# Patient Record
Sex: Female | Born: 1955 | Race: White | Hispanic: No | Smoking: Former smoker
Health system: Southern US, Community
[De-identification: ages and names within clinical notes are randomized; demographics above are authoritative.]

## PROBLEM LIST (undated history)

## (undated) DIAGNOSIS — J439 Emphysema, unspecified: Secondary | ICD-10-CM

## (undated) DIAGNOSIS — M545 Low back pain, unspecified: Secondary | ICD-10-CM

## (undated) DIAGNOSIS — G4700 Insomnia, unspecified: Secondary | ICD-10-CM

## (undated) DIAGNOSIS — G8929 Other chronic pain: Secondary | ICD-10-CM

## (undated) DIAGNOSIS — G8194 Hemiplegia, unspecified affecting left nondominant side: Secondary | ICD-10-CM

## (undated) DIAGNOSIS — G90529 Complex regional pain syndrome I of unspecified lower limb: Secondary | ICD-10-CM

## (undated) DIAGNOSIS — K219 Gastro-esophageal reflux disease without esophagitis: Secondary | ICD-10-CM

## (undated) DIAGNOSIS — E042 Nontoxic multinodular goiter: Secondary | ICD-10-CM

## (undated) DIAGNOSIS — J189 Pneumonia, unspecified organism: Secondary | ICD-10-CM

## (undated) DIAGNOSIS — F32A Depression, unspecified: Secondary | ICD-10-CM

## (undated) DIAGNOSIS — Z8669 Personal history of other diseases of the nervous system and sense organs: Secondary | ICD-10-CM

## (undated) DIAGNOSIS — F329 Major depressive disorder, single episode, unspecified: Secondary | ICD-10-CM

## (undated) DIAGNOSIS — I639 Cerebral infarction, unspecified: Secondary | ICD-10-CM

## (undated) DIAGNOSIS — F419 Anxiety disorder, unspecified: Secondary | ICD-10-CM

## (undated) DIAGNOSIS — J069 Acute upper respiratory infection, unspecified: Secondary | ICD-10-CM

## (undated) DIAGNOSIS — Z66 Do not resuscitate: Secondary | ICD-10-CM

## (undated) DIAGNOSIS — M75101 Unspecified rotator cuff tear or rupture of right shoulder, not specified as traumatic: Secondary | ICD-10-CM

## (undated) DIAGNOSIS — J449 Chronic obstructive pulmonary disease, unspecified: Secondary | ICD-10-CM

## (undated) DIAGNOSIS — D649 Anemia, unspecified: Secondary | ICD-10-CM

## (undated) DIAGNOSIS — M1990 Unspecified osteoarthritis, unspecified site: Secondary | ICD-10-CM

## (undated) DIAGNOSIS — K760 Fatty (change of) liver, not elsewhere classified: Secondary | ICD-10-CM

## (undated) DIAGNOSIS — R195 Other fecal abnormalities: Secondary | ICD-10-CM

## (undated) DIAGNOSIS — R51 Headache: Secondary | ICD-10-CM

## (undated) DIAGNOSIS — I4891 Unspecified atrial fibrillation: Secondary | ICD-10-CM

## (undated) DIAGNOSIS — N343 Urethral syndrome, unspecified: Secondary | ICD-10-CM

## (undated) DIAGNOSIS — G4730 Sleep apnea, unspecified: Secondary | ICD-10-CM

## (undated) DIAGNOSIS — Z87442 Personal history of urinary calculi: Secondary | ICD-10-CM

## (undated) HISTORY — DX: Hemiplegia, unspecified affecting left nondominant side: G81.94

## (undated) HISTORY — PX: BIOPSY THYROID: PRO38

## (undated) HISTORY — PX: APPENDECTOMY: SHX54

## (undated) HISTORY — PX: OTHER SURGICAL HISTORY: SHX169

## (undated) HISTORY — DX: Insomnia, unspecified: G47.00

## (undated) HISTORY — DX: Other fecal abnormalities: R19.5

## (undated) HISTORY — DX: Personal history of other diseases of the nervous system and sense organs: Z86.69

## (undated) HISTORY — DX: Emphysema, unspecified: J43.9

## (undated) HISTORY — DX: Unspecified atrial fibrillation: I48.91

## (undated) HISTORY — DX: Fatty (change of) liver, not elsewhere classified: K76.0

## (undated) HISTORY — PX: FRACTURE SURGERY: SHX138

---

## 1993-08-30 HISTORY — PX: ORIF ANKLE FRACTURE: SUR919

## 1995-08-31 DIAGNOSIS — I639 Cerebral infarction, unspecified: Secondary | ICD-10-CM

## 1995-08-31 HISTORY — DX: Cerebral infarction, unspecified: I63.9

## 2001-08-30 HISTORY — PX: ABDOMINAL HYSTERECTOMY: SHX81

## 2004-08-30 HISTORY — PX: OTHER SURGICAL HISTORY: SHX169

## 2008-08-30 DIAGNOSIS — G4730 Sleep apnea, unspecified: Secondary | ICD-10-CM | POA: Diagnosis present

## 2008-08-30 HISTORY — DX: Sleep apnea, unspecified: G47.30

## 2011-08-26 ENCOUNTER — Other Ambulatory Visit (HOSPITAL_COMMUNITY): Payer: Self-pay | Admitting: Orthopedic Surgery

## 2011-09-06 ENCOUNTER — Encounter (HOSPITAL_COMMUNITY): Payer: Self-pay | Admitting: Pharmacy Technician

## 2011-09-08 ENCOUNTER — Other Ambulatory Visit (HOSPITAL_COMMUNITY): Payer: Medicare Other

## 2011-09-09 ENCOUNTER — Inpatient Hospital Stay (HOSPITAL_COMMUNITY): Admission: RE | Admit: 2011-09-09 | Discharge: 2011-09-09 | Payer: Medicare Other | Source: Ambulatory Visit

## 2011-09-09 MED ORDER — CEFAZOLIN SODIUM 1-5 GM-% IV SOLN: 1.0000 g | INTRAVENOUS | Status: DC

## 2011-09-09 NOTE — Progress Notes (Signed)
I phoned patients home and spoke with patient -she said  she is not having surgery in the am because she has the flu.  "My daughter called somebody and told them that I wasn't coming in" Pt is still on the OR schedule.  I phoned the office, but it was closed.

## 2011-09-10 ENCOUNTER — Encounter (HOSPITAL_COMMUNITY): Payer: Self-pay | Admitting: Anesthesiology

## 2011-09-10 ENCOUNTER — Encounter (HOSPITAL_COMMUNITY)
Admission: RE | Disposition: A | Discharge status: Home / Self Care | Payer: Self-pay | Source: Ambulatory Visit | Attending: Orthopedic Surgery

## 2011-09-10 ENCOUNTER — Ambulatory Visit (HOSPITAL_COMMUNITY)
Admission: RE | Admit: 2011-09-10 | Discharge: 2011-09-10 | Disposition: A | Discharge status: Home / Self Care | Payer: Medicare Other | Source: Ambulatory Visit | Attending: Orthopedic Surgery | Admitting: Orthopedic Surgery

## 2011-09-10 SURGERY — AMPUTATION, ABOVE KNEE
Anesthesia: General | Site: Leg Upper | Laterality: Left

## 2011-09-15 ENCOUNTER — Other Ambulatory Visit (HOSPITAL_COMMUNITY): Payer: Self-pay | Admitting: Orthopedic Surgery

## 2011-09-21 ENCOUNTER — Encounter (HOSPITAL_COMMUNITY): Payer: Self-pay | Admitting: Respiratory Therapy

## 2011-09-28 ENCOUNTER — Ambulatory Visit (HOSPITAL_COMMUNITY)
Admission: RE | Admit: 2011-09-28 | Discharge: 2011-09-28 | Disposition: A | Discharge status: Home / Self Care | Payer: Medicare Other | Source: Ambulatory Visit | Attending: Orthopedic Surgery | Admitting: Orthopedic Surgery

## 2011-09-28 ENCOUNTER — Encounter (HOSPITAL_COMMUNITY): Payer: Self-pay

## 2011-09-28 DIAGNOSIS — G8929 Other chronic pain: Secondary | ICD-10-CM | POA: Insufficient documentation

## 2011-09-28 DIAGNOSIS — D649 Anemia, unspecified: Secondary | ICD-10-CM | POA: Insufficient documentation

## 2011-09-28 DIAGNOSIS — K219 Gastro-esophageal reflux disease without esophagitis: Secondary | ICD-10-CM | POA: Insufficient documentation

## 2011-09-28 DIAGNOSIS — J4489 Other specified chronic obstructive pulmonary disease: Secondary | ICD-10-CM | POA: Insufficient documentation

## 2011-09-28 DIAGNOSIS — R51 Headache: Secondary | ICD-10-CM | POA: Insufficient documentation

## 2011-09-28 DIAGNOSIS — G589 Mononeuropathy, unspecified: Secondary | ICD-10-CM | POA: Insufficient documentation

## 2011-09-28 DIAGNOSIS — J449 Chronic obstructive pulmonary disease, unspecified: Secondary | ICD-10-CM | POA: Insufficient documentation

## 2011-09-28 DIAGNOSIS — Z8673 Personal history of transient ischemic attack (TIA), and cerebral infarction without residual deficits: Secondary | ICD-10-CM | POA: Insufficient documentation

## 2011-09-28 DIAGNOSIS — F411 Generalized anxiety disorder: Secondary | ICD-10-CM | POA: Insufficient documentation

## 2011-09-28 DIAGNOSIS — G4733 Obstructive sleep apnea (adult) (pediatric): Secondary | ICD-10-CM | POA: Insufficient documentation

## 2011-09-28 DIAGNOSIS — Z01812 Encounter for preprocedural laboratory examination: Secondary | ICD-10-CM | POA: Insufficient documentation

## 2011-09-28 DIAGNOSIS — M549 Dorsalgia, unspecified: Secondary | ICD-10-CM | POA: Insufficient documentation

## 2011-09-28 DIAGNOSIS — M21549 Acquired clubfoot, unspecified foot: Secondary | ICD-10-CM | POA: Insufficient documentation

## 2011-09-28 HISTORY — DX: Headache: R51

## 2011-09-28 HISTORY — DX: Chronic obstructive pulmonary disease, unspecified: J44.9

## 2011-09-28 HISTORY — DX: Complex regional pain syndrome i of unspecified lower limb: G90.529

## 2011-09-28 HISTORY — DX: Unspecified rotator cuff tear or rupture of right shoulder, not specified as traumatic: M75.101

## 2011-09-28 HISTORY — DX: Low back pain, unspecified: M54.50

## 2011-09-28 HISTORY — DX: Low back pain: M54.5

## 2011-09-28 HISTORY — DX: Anemia, unspecified: D64.9

## 2011-09-28 HISTORY — DX: Gastro-esophageal reflux disease without esophagitis: K21.9

## 2011-09-28 HISTORY — DX: Unspecified osteoarthritis, unspecified site: M19.90

## 2011-09-28 HISTORY — DX: Urethral syndrome, unspecified: N34.3

## 2011-09-28 HISTORY — DX: Sleep apnea, unspecified: G47.30

## 2011-09-28 HISTORY — DX: Anxiety disorder, unspecified: F41.9

## 2011-09-28 HISTORY — DX: Cerebral infarction, unspecified: I63.9

## 2011-09-28 HISTORY — DX: Other chronic pain: G89.29

## 2011-09-28 LAB — COMPREHENSIVE METABOLIC PANEL
ALT: 57 U/L — ABNORMAL HIGH (ref 0–35)
Albumin: 4.1 g/dL (ref 3.5–5.2)
Alkaline Phosphatase: 124 U/L — ABNORMAL HIGH (ref 39–117)
BUN: 6 mg/dL (ref 6–23)
Chloride: 92 mEq/L — ABNORMAL LOW (ref 96–112)
GFR calc Af Amer: >90 mL/min (ref >90)
Glucose, Bld: 135 mg/dL — ABNORMAL HIGH (ref 70–99)
Potassium: 2.8 mEq/L — ABNORMAL LOW (ref 3.5–5.1)
Sodium: 141 mEq/L (ref 135–145)
Total Bilirubin: 0.6 mg/dL (ref 0.3–1.2)

## 2011-09-28 LAB — CBC
HCT: 49.1 % — ABNORMAL HIGH (ref 36.0–46.0)
Hemoglobin: 16.7 g/dL — ABNORMAL HIGH (ref 12.0–15.0)
WBC: 12.5 10*3/uL — ABNORMAL HIGH (ref 4.0–10.5)

## 2011-09-28 LAB — SURGICAL PCR SCREEN
MRSA, PCR: POSITIVE — AB
Staphylococcus aureus: POSITIVE — AB

## 2011-09-28 LAB — PROTIME-INR: Prothrombin Time: 13.6 seconds (ref 11.6–15.2)

## 2011-09-28 LAB — APTT: aPTT: 42 seconds — ABNORMAL HIGH (ref 24–37)

## 2011-09-28 NOTE — Pre-Procedure Instructions (Signed)
[address] Amanda Lewis  09/28/2011   Your procedure is scheduled on:WED  **Wed, Feb 6*  Report to [facility] at 0900 AM.  Call this number if you have problems the morning of surgery: [phone]   Remember:   Do not eat food:After Midnight.  May have clear liquids: up to 4 Hours before arrival.  Clear liquids include soda, tea, black coffee, apple or grape juice, broth.  Take these medicines the morning of surgery with A SIP OF WATER: *Oxycodone,Theodur,Seroquel,Nexium, Cymbalta, Flexeril, inhaler if needed**   Do not wear jewelry, make-up or nail polish.  Do not wear lotions, powders, or perfumes. You may wear deodorant.  Do not shave 48 hours prior to surgery.  Do not bring valuables to the hospital.  Contacts, dentures or bridgework may not be worn into surgery.  Leave suitcase in the car. After surgery it may be brought to your room.  For patients admitted to the hospital, checkout time is 11:00 AM the day of discharge.   Patients discharged the day of surgery will not be allowed to drive home.  Name and phone number of your driver: *n/a**  Special Instructions: CHG Shower Use Special Wash: 1/2 bottle night before surgery and 1/2 bottle morning of surgery.   Please read over the following fact sheets that you were given: Pain Booklet, Coughing and Deep Breathing, MRSA Information and Surgical Site Infection Prevention

## 2011-09-29 ENCOUNTER — Encounter (HOSPITAL_COMMUNITY): Payer: Self-pay | Admitting: Vascular Surgery

## 2011-09-29 NOTE — Consult Note (Signed)
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  Her history includes CVA, COPD, former smoker, OSA, anemia, GERD, headaches, anxiety, chronic back pain.  CXR from 10/27/10 at [facility] showed cardiomegaly, bibasilar ATX.    She had an echo on 03/30/11 at [facility] [phone]) that showed normal diastolic function, moderate resting HTN of 140/90, normal LVEF 65%, nor  mal LV cavity size, thickness, normal valves.      Her PCP Dr. Stefanie Libel office says they do not have an EKG.  I've requested one from [facility] if available, otherwise she will need one on the day of surgery.  Labs noted.  I called her K+ result to Dr. Stefanie Libel and Dr. Audrie Lia office (spoke with Denny Peon).  The patient was also notified and says she will touch base with Dr.Haque today to see if he will prescribe KCL supplementation.  She is on Lasix.  She also has an appointment to see him tomorrow.  Lab results were faxed to Dr. Ardelle Park.  Her AST and ALT were also mildly elevated at 55 and 57, respectively.  PTT was 42.  PT/INR WNL. WBC 12.5.  If we have not received recent EKG, then she will need an EKG, BMET, and PTT on arrival on the day of surgery.

## 2011-10-06 ENCOUNTER — Inpatient Hospital Stay (HOSPITAL_COMMUNITY): Admission: RE | Admit: 2011-10-06 | Payer: Medicare Other | Source: Ambulatory Visit | Admitting: Orthopedic Surgery

## 2011-10-06 ENCOUNTER — Encounter (HOSPITAL_COMMUNITY): Admission: RE | Payer: Self-pay | Source: Ambulatory Visit

## 2011-10-06 SURGERY — AMPUTATION, ABOVE KNEE
Anesthesia: General | Site: Leg Lower | Laterality: Left

## 2012-01-03 ENCOUNTER — Other Ambulatory Visit (HOSPITAL_COMMUNITY): Payer: Self-pay | Admitting: Orthopedic Surgery

## 2012-01-03 ENCOUNTER — Encounter (HOSPITAL_COMMUNITY): Payer: Self-pay | Admitting: Pharmacy Technician

## 2012-01-11 ENCOUNTER — Encounter (HOSPITAL_COMMUNITY): Payer: Self-pay | Admitting: *Deleted

## 2012-01-11 MED ORDER — CEFAZOLIN SODIUM 1-5 GM-% IV SOLN
1.0000 g | INTRAVENOUS | Status: DC
  Filled 2012-01-11: qty 50

## 2012-01-11 NOTE — Progress Notes (Signed)
Sleep Study requested from Dr Stefanie Libel OFFICE, Loran Senters

## 2012-01-12 ENCOUNTER — Inpatient Hospital Stay (HOSPITAL_COMMUNITY): Payer: Medicare Other

## 2012-01-12 ENCOUNTER — Encounter (HOSPITAL_COMMUNITY)
Admission: RE | Disposition: A | Discharge status: Skilled Nursing Facility | Payer: Self-pay | Source: Ambulatory Visit | Attending: Orthopedic Surgery

## 2012-01-12 ENCOUNTER — Encounter (HOSPITAL_COMMUNITY): Payer: Self-pay | Admitting: Anesthesiology

## 2012-01-12 ENCOUNTER — Inpatient Hospital Stay (HOSPITAL_COMMUNITY): Payer: Medicare Other | Admitting: Anesthesiology

## 2012-01-12 ENCOUNTER — Encounter (HOSPITAL_COMMUNITY): Payer: Self-pay | Admitting: *Deleted

## 2012-01-12 ENCOUNTER — Inpatient Hospital Stay (HOSPITAL_COMMUNITY)
Admission: RE | Admit: 2012-01-12 | Discharge: 2012-01-18 | DRG: 041 | Disposition: A | Discharge status: Skilled Nursing Facility | Payer: Medicare Other | Source: Ambulatory Visit | Attending: Orthopedic Surgery | Admitting: Orthopedic Surgery

## 2012-01-12 DIAGNOSIS — N189 Chronic kidney disease, unspecified: Secondary | ICD-10-CM | POA: Diagnosis present

## 2012-01-12 DIAGNOSIS — Z87891 Personal history of nicotine dependence: Secondary | ICD-10-CM

## 2012-01-12 DIAGNOSIS — F411 Generalized anxiety disorder: Secondary | ICD-10-CM | POA: Diagnosis present

## 2012-01-12 DIAGNOSIS — M129 Arthropathy, unspecified: Secondary | ICD-10-CM | POA: Diagnosis present

## 2012-01-12 DIAGNOSIS — J449 Chronic obstructive pulmonary disease, unspecified: Secondary | ICD-10-CM | POA: Diagnosis present

## 2012-01-12 DIAGNOSIS — I96 Gangrene, not elsewhere classified: Secondary | ICD-10-CM | POA: Diagnosis present

## 2012-01-12 DIAGNOSIS — J4489 Other specified chronic obstructive pulmonary disease: Secondary | ICD-10-CM | POA: Diagnosis present

## 2012-01-12 DIAGNOSIS — K219 Gastro-esophageal reflux disease without esophagitis: Secondary | ICD-10-CM | POA: Diagnosis present

## 2012-01-12 DIAGNOSIS — M62838 Other muscle spasm: Secondary | ICD-10-CM | POA: Diagnosis present

## 2012-01-12 DIAGNOSIS — Z8673 Personal history of transient ischemic attack (TIA), and cerebral infarction without residual deficits: Secondary | ICD-10-CM

## 2012-01-12 DIAGNOSIS — G90522 Complex regional pain syndrome I of left lower limb: Secondary | ICD-10-CM | POA: Diagnosis present

## 2012-01-12 DIAGNOSIS — G589 Mononeuropathy, unspecified: Principal | ICD-10-CM | POA: Diagnosis present

## 2012-01-12 HISTORY — DX: Major depressive disorder, single episode, unspecified: F32.9

## 2012-01-12 HISTORY — DX: Pneumonia, unspecified organism: J18.9

## 2012-01-12 HISTORY — DX: Depression, unspecified: F32.A

## 2012-01-12 HISTORY — DX: Acute upper respiratory infection, unspecified: J06.9

## 2012-01-12 HISTORY — PX: AMPUTATION: SHX166

## 2012-01-12 LAB — CBC
HCT: 43.6 % (ref 36.0–46.0)
MCH: 29.4 pg (ref 26.0–34.0)
MCHC: 32.3 g/dL (ref 30.0–36.0)
MCV: 90.8 fL (ref 78.0–100.0)
Platelets: 243 10*3/uL (ref 150–400)
RDW: 12.8 % (ref 11.5–15.5)
WBC: 9.9 10*3/uL (ref 4.0–10.5)

## 2012-01-12 LAB — SURGICAL PCR SCREEN
MRSA, PCR: NEGATIVE
Staphylococcus aureus: NEGATIVE

## 2012-01-12 LAB — COMPREHENSIVE METABOLIC PANEL
ALT: 15 U/L (ref 0–35)
AST: 14 U/L (ref 0–37)
Albumin: 3.6 g/dL (ref 3.5–5.2)
BUN: 7 mg/dL (ref 6–23)
Calcium: 10.3 mg/dL (ref 8.4–10.5)
Chloride: 102 mEq/L (ref 96–112)
Creatinine, Ser: 0.64 mg/dL (ref 0.50–1.10)
Sodium: 141 mEq/L (ref 135–145)
Total Bilirubin: 0.2 mg/dL — ABNORMAL LOW (ref 0.3–1.2)

## 2012-01-12 LAB — PROTIME-INR
INR: 1.03 (ref 0.00–1.49)
Prothrombin Time: 13.7 seconds (ref 11.6–15.2)

## 2012-01-12 SURGERY — AMPUTATION, ABOVE KNEE
Anesthesia: General | Site: Leg Upper | Laterality: Left | Wound class: Clean

## 2012-01-12 MED ORDER — FENTANYL 25 MCG/HR TD PT72
25.0000 ug | MEDICATED_PATCH | TRANSDERMAL | Status: DC
  Administered 2012-01-14 – 2012-01-16 (×2): 25 ug via TRANSDERMAL
  Filled 2012-01-12 (×3): qty 1

## 2012-01-12 MED ORDER — ONDANSETRON HCL 4 MG/2ML IJ SOLN: 4.0000 mg | Freq: Once | INTRAMUSCULAR | Status: DC | PRN

## 2012-01-12 MED ORDER — CEFAZOLIN SODIUM-DEXTROSE 2-3 GM-% IV SOLR
INTRAVENOUS | Status: AC
  Administered 2012-01-12: 2 g via INTRAVENOUS
  Filled 2012-01-12: qty 50

## 2012-01-12 MED ORDER — OXYCODONE HCL 5 MG PO TABS: 30.0000 mg | ORAL_TABLET | ORAL | Status: DC

## 2012-01-12 MED ORDER — METHOCARBAMOL 100 MG/ML IJ SOLN
500.0000 mg | Freq: Four times a day (QID) | INTRAVENOUS | Status: DC | PRN
  Filled 2012-01-12: qty 5

## 2012-01-12 MED ORDER — MUPIROCIN 2 % EX OINT
TOPICAL_OINTMENT | CUTANEOUS | Status: AC
  Administered 2012-01-12: 1
  Filled 2012-01-12: qty 22

## 2012-01-12 MED ORDER — THEOPHYLLINE ER 300 MG PO TB12
300.0000 mg | ORAL_TABLET | Freq: Two times a day (BID) | ORAL | Status: DC
  Administered 2012-01-12 – 2012-01-17 (×10): 300 mg via ORAL
  Filled 2012-01-12 (×13): qty 1

## 2012-01-12 MED ORDER — DIAZEPAM 5 MG PO TABS
10.0000 mg | ORAL_TABLET | Freq: Three times a day (TID) | ORAL | Status: DC | PRN
  Administered 2012-01-12 – 2012-01-17 (×3): 10 mg via ORAL
  Filled 2012-01-12 (×2): qty 2
  Filled 2012-01-12 (×2): qty 1

## 2012-01-12 MED ORDER — PROPOFOL 10 MG/ML IV EMUL
INTRAVENOUS | Status: DC | PRN
  Administered 2012-01-12: 40 mg via INTRAVENOUS
  Administered 2012-01-12: 150 mg via INTRAVENOUS

## 2012-01-12 MED ORDER — WARFARIN - PHARMACIST DOSING INPATIENT: Freq: Every day | Status: DC

## 2012-01-12 MED ORDER — FUROSEMIDE 20 MG PO TABS
20.0000 mg | ORAL_TABLET | Freq: Every day | ORAL | Status: DC
  Administered 2012-01-12 – 2012-01-17 (×6): 20 mg via ORAL
  Filled 2012-01-12 (×7): qty 1

## 2012-01-12 MED ORDER — METOCLOPRAMIDE HCL 5 MG/ML IJ SOLN: 5.0000 mg | Freq: Three times a day (TID) | INTRAMUSCULAR | Status: DC | PRN

## 2012-01-12 MED ORDER — DULOXETINE HCL 60 MG PO CPEP
60.0000 mg | ORAL_CAPSULE | Freq: Every day | ORAL | Status: DC
  Administered 2012-01-13 – 2012-01-17 (×5): 60 mg via ORAL
  Filled 2012-01-12 (×7): qty 1

## 2012-01-12 MED ORDER — ALBUTEROL SULFATE HFA 108 (90 BASE) MCG/ACT IN AERS
2.0000 | INHALATION_SPRAY | Freq: Four times a day (QID) | RESPIRATORY_TRACT | Status: DC | PRN
  Filled 2012-01-12: qty 6.7

## 2012-01-12 MED ORDER — DEXTROSE 5 % IV SOLN
INTRAVENOUS | Status: DC | PRN
  Administered 2012-01-12: 09:00:00 via INTRAVENOUS

## 2012-01-12 MED ORDER — PANTOPRAZOLE SODIUM 40 MG PO TBEC
40.0000 mg | DELAYED_RELEASE_TABLET | Freq: Every day | ORAL | Status: DC
  Administered 2012-01-12 – 2012-01-17 (×6): 40 mg via ORAL
  Filled 2012-01-12 (×4): qty 1

## 2012-01-12 MED ORDER — ONDANSETRON HCL 4 MG/2ML IJ SOLN: 4.0000 mg | Freq: Four times a day (QID) | INTRAMUSCULAR | Status: DC | PRN

## 2012-01-12 MED ORDER — CEFAZOLIN SODIUM-DEXTROSE 2-3 GM-% IV SOLR: 2.0000 g | INTRAVENOUS | Status: DC

## 2012-01-12 MED ORDER — HYDROCHLOROTHIAZIDE 25 MG PO TABS
25.0000 mg | ORAL_TABLET | Freq: Every day | ORAL | Status: DC
  Administered 2012-01-12 – 2012-01-16 (×4): 25 mg via ORAL
  Filled 2012-01-12 (×7): qty 1

## 2012-01-12 MED ORDER — FENTANYL CITRATE 0.05 MG/ML IJ SOLN
INTRAMUSCULAR | Status: DC | PRN
  Administered 2012-01-12 (×2): 100 ug via INTRAVENOUS

## 2012-01-12 MED ORDER — HYDROMORPHONE HCL PF 1 MG/ML IJ SOLN
0.5000 mg | INTRAMUSCULAR | Status: DC | PRN
  Administered 2012-01-12: 0.5 mg via INTRAVENOUS
  Administered 2012-01-12: 1 mg via INTRAVENOUS
  Administered 2012-01-12: 0.5 mg via INTRAVENOUS
  Administered 2012-01-13 – 2012-01-14 (×6): 1 mg via INTRAVENOUS
  Filled 2012-01-12 (×9): qty 1

## 2012-01-12 MED ORDER — OXYCODONE HCL 5 MG PO TABS
30.0000 mg | ORAL_TABLET | ORAL | Status: DC | PRN
  Administered 2012-01-12 (×2): 30 mg via ORAL
  Administered 2012-01-13: 15 mg via ORAL
  Administered 2012-01-13 (×2): 30 mg via ORAL
  Administered 2012-01-14: 15 mg via ORAL
  Administered 2012-01-14 – 2012-01-17 (×9): 30 mg via ORAL
  Filled 2012-01-12 (×13): qty 6
  Filled 2012-01-12: qty 15
  Filled 2012-01-12: qty 3
  Filled 2012-01-12: qty 6

## 2012-01-12 MED ORDER — COUMADIN BOOK
Freq: Once | Status: AC
  Administered 2012-01-12: 13:00:00
  Filled 2012-01-12: qty 1

## 2012-01-12 MED ORDER — HYDROMORPHONE HCL PF 1 MG/ML IJ SOLN
INTRAMUSCULAR | Status: AC
  Filled 2012-01-12: qty 1

## 2012-01-12 MED ORDER — SUCCINYLCHOLINE CHLORIDE 20 MG/ML IJ SOLN
INTRAMUSCULAR | Status: DC | PRN
  Administered 2012-01-12: 100 mg via INTRAVENOUS

## 2012-01-12 MED ORDER — SODIUM CHLORIDE 0.9 % IV SOLN: INTRAVENOUS | Status: DC

## 2012-01-12 MED ORDER — MIDAZOLAM HCL 5 MG/5ML IJ SOLN
INTRAMUSCULAR | Status: DC | PRN
  Administered 2012-01-12: 1 mg via INTRAVENOUS

## 2012-01-12 MED ORDER — ONDANSETRON HCL 4 MG/2ML IJ SOLN
INTRAMUSCULAR | Status: DC | PRN
  Administered 2012-01-12: 4 mg via INTRAVENOUS

## 2012-01-12 MED ORDER — HYDROMORPHONE HCL PF 1 MG/ML IJ SOLN
0.2500 mg | INTRAMUSCULAR | Status: DC | PRN
  Administered 2012-01-12 (×2): 0.5 mg via INTRAVENOUS
  Administered 2012-01-12: 1 mg via INTRAVENOUS

## 2012-01-12 MED ORDER — THEOPHYLLINE ER 300 MG PO TB12: 300.0000 mg | ORAL_TABLET | Freq: Two times a day (BID) | ORAL | Status: DC

## 2012-01-12 MED ORDER — CEFAZOLIN SODIUM-DEXTROSE 2-3 GM-% IV SOLR
2.0000 g | Freq: Four times a day (QID) | INTRAVENOUS | Status: AC
  Administered 2012-01-12 – 2012-01-13 (×3): 2 g via INTRAVENOUS
  Filled 2012-01-12 (×3): qty 50

## 2012-01-12 MED ORDER — 0.9 % SODIUM CHLORIDE (POUR BTL) OPTIME
TOPICAL | Status: DC | PRN
  Administered 2012-01-12: 1000 mL

## 2012-01-12 MED ORDER — METHOCARBAMOL 500 MG PO TABS
500.0000 mg | ORAL_TABLET | Freq: Four times a day (QID) | ORAL | Status: DC | PRN
Start: 2012-01-12 — End: 2012-01-18
  Administered 2012-01-12 – 2012-01-18 (×7): 500 mg via ORAL
  Filled 2012-01-12 (×8): qty 1

## 2012-01-12 MED ORDER — WARFARIN SODIUM 6 MG PO TABS
6.0000 mg | ORAL_TABLET | Freq: Once | ORAL | Status: AC
  Administered 2012-01-12: 6 mg via ORAL
  Filled 2012-01-12: qty 1

## 2012-01-12 MED ORDER — CYCLOBENZAPRINE HCL 10 MG PO TABS
10.0000 mg | ORAL_TABLET | ORAL | Status: DC
  Filled 2012-01-12: qty 1

## 2012-01-12 MED ORDER — GABAPENTIN 300 MG PO CAPS
300.0000 mg | ORAL_CAPSULE | Freq: Three times a day (TID) | ORAL | Status: DC
  Administered 2012-01-12 – 2012-01-17 (×18): 300 mg via ORAL
  Filled 2012-01-12 (×21): qty 1

## 2012-01-12 MED ORDER — QUETIAPINE FUMARATE 25 MG PO TABS
25.0000 mg | ORAL_TABLET | Freq: Two times a day (BID) | ORAL | Status: DC
  Administered 2012-01-12 – 2012-01-17 (×10): 25 mg via ORAL
  Filled 2012-01-12 (×15): qty 1

## 2012-01-12 MED ORDER — WARFARIN VIDEO: Freq: Once | Status: DC

## 2012-01-12 MED ORDER — OXYCODONE HCL 5 MG PO TABS
ORAL_TABLET | ORAL | Status: AC
  Filled 2012-01-12: qty 1

## 2012-01-12 MED ORDER — CYCLOBENZAPRINE HCL 10 MG PO TABS
10.0000 mg | ORAL_TABLET | Freq: Three times a day (TID) | ORAL | Status: DC
  Administered 2012-01-12 – 2012-01-17 (×17): 10 mg via ORAL
  Filled 2012-01-12 (×20): qty 1

## 2012-01-12 MED ORDER — FENTANYL 50 MCG/HR TD PT72
100.0000 ug | MEDICATED_PATCH | TRANSDERMAL | Status: DC
  Administered 2012-01-13 – 2012-01-17 (×3): 100 ug via TRANSDERMAL
  Filled 2012-01-12 (×3): qty 2

## 2012-01-12 MED ORDER — DIAZEPAM 5 MG PO TABS: 10.0000 mg | ORAL_TABLET | Freq: Three times a day (TID) | ORAL | Status: DC | PRN

## 2012-01-12 MED ORDER — METOCLOPRAMIDE HCL 10 MG PO TABS: 5.0000 mg | ORAL_TABLET | Freq: Three times a day (TID) | ORAL | Status: DC | PRN

## 2012-01-12 MED ORDER — ONDANSETRON HCL 4 MG PO TABS
4.0000 mg | ORAL_TABLET | Freq: Four times a day (QID) | ORAL | Status: DC | PRN
  Administered 2012-01-15: 4 mg via ORAL
  Filled 2012-01-12: qty 1

## 2012-01-12 MED ORDER — LACTATED RINGERS IV SOLN
INTRAVENOUS | Status: DC | PRN
  Administered 2012-01-12: 09:00:00 via INTRAVENOUS

## 2012-01-12 SURGICAL SUPPLY — 58 items
BANDAGE ESMARK 6X9 LF (GAUZE/BANDAGES/DRESSINGS) ×1 IMPLANT
BANDAGE GAUZE ELAST BULKY 4 IN (GAUZE/BANDAGES/DRESSINGS) ×1 IMPLANT
BLADE SAW RECIP 87.9 MT (BLADE) ×2 IMPLANT
BNDG CMPR 9X6 STRL LF SNTH (GAUZE/BANDAGES/DRESSINGS) ×1
BNDG COHESIVE 6X5 TAN STRL LF (GAUZE/BANDAGES/DRESSINGS) ×3 IMPLANT
BNDG ESMARK 6X9 LF (GAUZE/BANDAGES/DRESSINGS) ×2
BNDG GAUZE STRTCH 6 (GAUZE/BANDAGES/DRESSINGS) IMPLANT
CLOTH BEACON ORANGE TIMEOUT ST (SAFETY) ×2 IMPLANT
COVER SURGICAL LIGHT HANDLE (MISCELLANEOUS) ×2 IMPLANT
CUFF TOURNIQUET SINGLE 34IN LL (TOURNIQUET CUFF) IMPLANT
CUFF TOURNIQUET SINGLE 44IN (TOURNIQUET CUFF) IMPLANT
DRAIN PENROSE 1/2X12 LTX STRL (WOUND CARE) IMPLANT
DRAPE EXTREMITY T 121X128X90 (DRAPE) ×2 IMPLANT
DRAPE PROXIMA HALF (DRAPES) ×4 IMPLANT
DRAPE U-SHAPE 47X51 STRL (DRAPES) ×4 IMPLANT
DRSG ADAPTIC 3X8 NADH LF (GAUZE/BANDAGES/DRESSINGS) ×2 IMPLANT
DRSG PAD ABDOMINAL 8X10 ST (GAUZE/BANDAGES/DRESSINGS) ×1 IMPLANT
DURAPREP 26ML APPLICATOR (WOUND CARE) ×2 IMPLANT
ELECT CAUTERY BLADE 6.4 (BLADE) IMPLANT
ELECT REM PT RETURN 9FT ADLT (ELECTROSURGICAL) ×2
ELECTRODE REM PT RTRN 9FT ADLT (ELECTROSURGICAL) ×1 IMPLANT
EVACUATOR 1/8 PVC DRAIN (DRAIN) IMPLANT
GLOVE BIOGEL PI IND STRL 6.5 (GLOVE) IMPLANT
GLOVE BIOGEL PI IND STRL 7.0 (GLOVE) IMPLANT
GLOVE BIOGEL PI IND STRL 9 (GLOVE) ×1 IMPLANT
GLOVE BIOGEL PI INDICATOR 6.5 (GLOVE) ×1
GLOVE BIOGEL PI INDICATOR 7.0 (GLOVE) ×1
GLOVE BIOGEL PI INDICATOR 9 (GLOVE) ×1
GLOVE SS BIOGEL STRL SZ 6.5 (GLOVE) IMPLANT
GLOVE SUPERSENSE BIOGEL SZ 6.5 (GLOVE) ×1
GLOVE SURG ORTHO 9.0 STRL STRW (GLOVE) ×2 IMPLANT
GLOVE SURG SS PI 6.5 STRL IVOR (GLOVE) ×1 IMPLANT
GOWN PREVENTION PLUS XLARGE (GOWN DISPOSABLE) ×2 IMPLANT
GOWN SRG XL XLNG 56XLVL 4 (GOWN DISPOSABLE) ×1 IMPLANT
GOWN STRL NON-REIN LRG LVL3 (GOWN DISPOSABLE) ×1 IMPLANT
GOWN STRL NON-REIN XL XLG LVL4 (GOWN DISPOSABLE) ×2
KIT BASIN OR (CUSTOM PROCEDURE TRAY) ×2 IMPLANT
KIT ROOM TURNOVER OR (KITS) ×2 IMPLANT
MANIFOLD NEPTUNE II (INSTRUMENTS) ×2 IMPLANT
NS IRRIG 1000ML POUR BTL (IV SOLUTION) ×2 IMPLANT
PACK GENERAL/GYN (CUSTOM PROCEDURE TRAY) ×2 IMPLANT
PAD ARMBOARD 7.5X6 YLW CONV (MISCELLANEOUS) ×3 IMPLANT
PAD CAST 4YDX4 CTTN HI CHSV (CAST SUPPLIES) ×1 IMPLANT
PADDING CAST COTTON 4X4 STRL (CAST SUPPLIES) ×2
PADDING CAST COTTON 6X4 STRL (CAST SUPPLIES) ×2 IMPLANT
SPONGE GAUZE 4X4 12PLY (GAUZE/BANDAGES/DRESSINGS) ×2 IMPLANT
SPONGE LAP 18X18 X RAY DECT (DISPOSABLE) IMPLANT
STAPLER VISISTAT 35W (STAPLE) IMPLANT
STOCKINETTE IMPERVIOUS LG (DRAPES) ×1 IMPLANT
SUT PDS AB 1 CT  36 (SUTURE) ×2
SUT PDS AB 1 CT 36 (SUTURE) IMPLANT
SUT SILK 2 0 (SUTURE) ×2
SUT SILK 2-0 18XBRD TIE 12 (SUTURE) ×1 IMPLANT
SWAB COLLECTION DEVICE MRSA (MISCELLANEOUS) IMPLANT
TOWEL OR 17X24 6PK STRL BLUE (TOWEL DISPOSABLE) ×2 IMPLANT
TOWEL OR 17X26 10 PK STRL BLUE (TOWEL DISPOSABLE) ×2 IMPLANT
TUBE ANAEROBIC SPECIMEN COL (MISCELLANEOUS) IMPLANT
WATER STERILE IRR 1000ML POUR (IV SOLUTION) ×1 IMPLANT

## 2012-01-12 NOTE — H&P (Deleted)
Amanda Lewis is an 56 y.o. female.   Chief Complaint: Gangrene left leg HPI: Patient is a 56 year old woman who has had progressive gangrenous changes to the left lower extremity and presents at this time for a left above-the-knee amputation. Conservative wound care treatment has failed and she presents at this time for surgical intervention.  Past Medical History  Diagnosis Date  . Stroke 1997  . Asthma     exertional  . COPD (chronic obstructive pulmonary disease)     usesO2 24/7  . Anemia   . Dysuria-frequency syndrome   . GERD (gastroesophageal reflux disease)   . Arthritis     arms  . Rotator cuff tear, right   . Anxiety     on treatment  . Chronic lower back pain   . RSD lower limb     left lower leg  . Recurrent upper respiratory infection (URI)   . Pneumonia     in march  . Sleep apnea 2010    uses CPAP nightly- Dr Ardelle Park in [place]  . Chronic kidney disease     "Floating stone currentl:- 01/12/12  . Headache     intermittent migraines, Last one 12/29/2011  . Depression     Past Surgical History  Procedure Date  . Orif ankle fracture 1995  . Appendectomy   . Abdominal hysterectomy 2003  . Fracture surgery   . Morphine pump   . Morphine pump removed 2006    Family History  Problem Relation Age of Onset  . Anesthesia problems Neg Hx    Social History:  reports that she quit smoking about 3 years ago. Her smoking use included Cigarettes. She has a 70 pack-year smoking history. She has never used smokeless tobacco. She reports that she does not drink alcohol or use illicit drugs.  Allergies:  Allergies  Allergen Reactions  . Aspirin Hives  . Bactrim (Sulfamethoxazole-Tmp Ds) Hives  . Nitroglycerin Other (See Comments)    Blood pressure drops dangerously low  . Percodan (Oxycodone-Aspirin) Hives  . Tape Rash    Adhesive    No prescriptions prior to admission    No results found for this or any previous visit (from the past 48 hour(s)). No results  found.  Review of Systems  All other systems reviewed and are negative.    Height 5' 5.5" (1.664 m), weight 81.647 kg (180 lb). Physical Exam  Gangrenous changes left lower extremity. Assessment/Plan Gangrene left lower extremity.  Plan: We will plan for a left above-the-knee amputation. Risks and benefits of surgery were discussed including infection neurovascular injury nonhealing of the wound need for additional surgery patient and family state they understand and wish to proceed at this time.  Amanda Lewis V 01/12/2012, 6:09 AM

## 2012-01-12 NOTE — Progress Notes (Signed)
ANTICOAGULATION CONSULT NOTE - Initial Consult  Pharmacy Consult for Coumadin Indication: VTE prophylaxis  Allergies  Allergen Reactions  . Aspirin Hives  . Bactrim (Sulfamethoxazole-Tmp Ds) Hives  . Nitroglycerin Other (See Comments)    Blood pressure drops dangerously low  . Percodan (Oxycodone-Aspirin) Hives  . Tape Rash    Adhesive    Patient Measurements: Height: 5' 5.5" (166.4 cm) Weight: 180 lb (81.647 kg) IBW/kg (Calculated) : 58.15  Heparin Dosing Weight:   Vital Signs: Temp: 97.8 F (36.6 C) (05/15 1137) Temp src: Oral (05/15 0724) BP: 127/67 mmHg (05/15 1137) Pulse Rate: 99  (05/15 1137)  Labs:  [facility] 01/12/12 0757  HGB 14.1  HCT 43.6  PLT 243  APTT 35  LABPROT 13.7  INR 1.03  HEPARINUNFRC --  CREATININE 0.64  CKTOTAL --  CKMB --  TROPONINI --    Estimated Creatinine Clearance: 82.8 ml/min (by C-G formula based on Cr of 0.64).   Medical History: Past Medical History  Diagnosis Date  . Stroke 1997  . Asthma     exertional  . COPD (chronic obstructive pulmonary disease)     usesO2 24/7  . Anemia   . Dysuria-frequency syndrome   . GERD (gastroesophageal reflux disease)   . Arthritis     arms  . Rotator cuff tear, right   . Anxiety     on treatment  . Chronic lower back pain   . RSD lower limb     left lower leg  . Recurrent upper respiratory infection (URI)   . Pneumonia     in march  . Sleep apnea 2010    uses CPAP nightly- Dr Ardelle Park in [place]  . Chronic kidney disease     "Floating stone currentl:- 01/12/12  . Headache     intermittent migraines, Last one 12/29/2011  . Depression     Medications:  Prescriptions prior to admission  Medication Sig Dispense Refill  . albuterol (PROVENTIL HFA;VENTOLIN HFA) 108 (90 BASE) MCG/ACT inhaler Inhale 2 puffs into the lungs every 6 (six) hours as needed. For shortness of breath       . Cholecalciferol (VITAMIN D3) 5000 UNITS TABS Take 5,000 Units by mouth every other day.       .  cyclobenzaprine (FLEXERIL) 10 MG tablet Take 10 mg by mouth 3 (three) times daily. scheduled       . diazepam (VALIUM) 10 MG tablet Take 10 mg by mouth every 8 (eight) hours as needed. For anxiety      . DULoxetine (CYMBALTA) 60 MG capsule Take 60 mg by mouth daily.        Marland Kitchen esomeprazole (NEXIUM) 40 MG capsule Take 40 mg by mouth daily.        . fentaNYL (DURAGESIC - DOSED MCG/HR) 100 MCG/HR Place 1 patch onto the skin every other day as needed. For pain alternating with patch      . fentaNYL (DURAGESIC - DOSED MCG/HR) 25 MCG/HR Place 1 patch onto the skin every other day as needed. For pain. Alternating with patch      . furosemide (LASIX) 20 MG tablet Take 20 mg by mouth daily.        . hydrochlorothiazide (HYDRODIURIL) 25 MG tablet Take 25 mg by mouth daily.        Marland Kitchen oxycodone (ROXICODONE) 30 MG immediate release tablet Take 30 mg by mouth every 4 (four) hours as needed. For pain       . QUEtiapine (SEROQUEL) 25 MG tablet  Take 25 mg by mouth 2 (two) times daily.       . theophylline (THEODUR) 300 MG 12 hr tablet Take 300 mg by mouth 2 (two) times daily.          Assessment: 56yof to start Coumadin for VTE prophylaxis s/p AKA.  - Baseline INR: 1.03 - H/H and Plts wnl - Warfarin points: 4  Goal of Therapy:  INR 2-3   Plan:  1. Coumadin 6mg  po x 1 today 2. Daily PT/INR 3. Coumadin educational book/video  Cleon Dew [phone] 01/12/2012,12:55 PM

## 2012-01-12 NOTE — Progress Notes (Signed)
UR COMPLETED  

## 2012-01-12 NOTE — H&P (Signed)
Amanda Lewis is an 56 y.o. female.   Chief Complaint: Complex regional pain syndrome left lower extremity with 8.0 varus contracture unable to weight-bear on the left lower extremity HPI: Patient has had multiple surgeries on her left lower extremity. She has equinovarus contracture is unable to weight-bear has chronic pain which is uncontrollable despite pain management and patient was proceed with above-knee amputation do to the complex regional pain syndrome left lower extremity.  Past Medical History  Diagnosis Date  . Stroke 1997  . Asthma     exertional  . COPD (chronic obstructive pulmonary disease)     usesO2 24/7  . Anemia   . Dysuria-frequency syndrome   . GERD (gastroesophageal reflux disease)   . Arthritis     arms  . Rotator cuff tear, right   . Anxiety     on treatment  . Chronic lower back pain   . RSD lower limb     left lower leg  . Recurrent upper respiratory infection (URI)   . Pneumonia     in march  . Sleep apnea 2010    uses CPAP nightly- Dr Ardelle Park in [place]  . Chronic kidney disease     "Floating stone currentl:- 01/12/12  . Headache     intermittent migraines, Last one 12/29/2011  . Depression     Past Surgical History  Procedure Date  . Orif ankle fracture 1995  . Appendectomy   . Abdominal hysterectomy 2003  . Fracture surgery   . Morphine pump   . Morphine pump removed 2006    Family History  Problem Relation Age of Onset  . Anesthesia problems Neg Hx    Social History:  reports that she quit smoking about 3 years ago. Her smoking use included Cigarettes. She has a 70 pack-year smoking history. She has never used smokeless tobacco. She reports that she does not drink alcohol or use illicit drugs.  Allergies:  Allergies  Allergen Reactions  . Aspirin Hives  . Bactrim (Sulfamethoxazole-Tmp Ds) Hives  . Nitroglycerin Other (See Comments)    Blood pressure drops dangerously low  . Percodan (Oxycodone-Aspirin) Hives  . Tape Rash    Adhesive      Medications Prior to Admission  Medication Sig Dispense Refill  . albuterol (PROVENTIL HFA;VENTOLIN HFA) 108 (90 BASE) MCG/ACT inhaler Inhale 2 puffs into the lungs every 6 (six) hours as needed. For shortness of breath       . Cholecalciferol (VITAMIN D3) 5000 UNITS TABS Take 5,000 Units by mouth every other day.       . cyclobenzaprine (FLEXERIL) 10 MG tablet Take 10 mg by mouth 3 (three) times daily. scheduled       . diazepam (VALIUM) 10 MG tablet Take 10 mg by mouth every 8 (eight) hours as needed. For anxiety      . DULoxetine (CYMBALTA) 60 MG capsule Take 60 mg by mouth daily.        Marland Kitchen esomeprazole (NEXIUM) 40 MG capsule Take 40 mg by mouth daily.        . fentaNYL (DURAGESIC - DOSED MCG/HR) 100 MCG/HR Place 1 patch onto the skin every other day as needed. For pain alternating with patch      . fentaNYL (DURAGESIC - DOSED MCG/HR) 25 MCG/HR Place 1 patch onto the skin every other day as needed. For pain. Alternating with patch      . furosemide (LASIX) 20 MG tablet Take 20 mg by mouth daily.        Marland Kitchen  hydrochlorothiazide (HYDRODIURIL) 25 MG tablet Take 25 mg by mouth daily.        Marland Kitchen oxycodone (ROXICODONE) 30 MG immediate release tablet Take 30 mg by mouth every 4 (four) hours as needed. For pain       . QUEtiapine (SEROQUEL) 25 MG tablet Take 25 mg by mouth 2 (two) times daily.       . theophylline (THEODUR) 300 MG 12 hr tablet Take 300 mg by mouth 2 (two) times daily.          Results for orders placed during the hospital encounter of 01/12/12 (from the past 48 hour(s))  CBC     Status: Normal   Collection Time   01/12/12  7:57 AM      Component Value Range Comment   WBC 9.9  4.0 - 10.5 (K/uL)    RBC 4.80  3.87 - 5.11 (MIL/uL)    Hemoglobin 14.1  12.0 - 15.0 (g/dL)    HCT 16.1  09.6 - 04.5 (%)    MCV 90.8  78.0 - 100.0 (fL)    MCH 29.4  26.0 - 34.0 (pg)    MCHC 32.3  30.0 - 36.0 (g/dL)    RDW 40.9  81.1 - 91.4 (%)    Platelets 243  150 - 400 (K/uL)    No  results found.  Review of Systems  All other systems reviewed and are negative.    Blood pressure 106/70, pulse 96, temperature 97.9 F (36.6 C), temperature source Oral, resp. rate 18, height 5' 5.5" (1.664 m), weight 81.647 kg (180 lb), SpO2 94.00%. Physical Exam  On examination patient has equinovarus contracture she has thin atrophic skin hypersensitivity to light touch changes in temperature and skin moisture. Patient has pain which prevents her from proceeding with activities of daily living. Assessment/Plan Assessment: Complex regional pain syndrome with equinovarus contracture left lower extremity unable to weight-bear unable to perform activities daily living.  Plan: Due to failure conservative treatment pain management patient wished to proceed with above-knee amputation. Risks and benefits were discussed including persistent pain need for additional surgery nonhealing of the wound. Patient's age understands was to proceed at this time.  Shakirah Kirkey V 01/12/2012, 8:27 AM

## 2012-01-12 NOTE — Anesthesia Preprocedure Evaluation (Signed)
Anesthesia Evaluation  Patient identified by MRN, date of birth, ID band Patient awake    Reviewed: Allergy & Precautions, H&P , NPO status , Patient's Chart, lab work & pertinent test results  Airway       Dental  (+) Edentulous Upper and Edentulous Lower   Pulmonary  breath sounds clear to auscultation        Cardiovascular Rhythm:Regular Rate:Normal     Neuro/Psych    GI/Hepatic   Endo/Other    Renal/GU      Musculoskeletal   Abdominal (+) + obese,   Peds  Hematology   Anesthesia Other Findings   Reproductive/Obstetrics                           Anesthesia Physical Anesthesia Plan  ASA: III  Anesthesia Plan: General   Post-op Pain Management:    Induction: Intravenous  Airway Management Planned: LMA  Additional Equipment:   Intra-op Plan:   Post-operative Plan:   Informed Consent: I have reviewed the patients History and Physical, chart, labs and discussed the procedure including the risks, benefits and alternatives for the proposed anesthesia with the patient or authorized representative who has indicated his/her understanding and acceptance.     Plan Discussed with:   Anesthesia Plan Comments: (RSD L. LE with contracture and chronic pain Htn COPD OSA on CPAP H/O stroke 1997 without residual   Plan GA with LMA  Kipp Brood)        Anesthesia Quick Evaluation

## 2012-01-12 NOTE — Preoperative (Signed)
Beta Blockers   Reason not to administer Beta Blockers:Not Applicable 

## 2012-01-12 NOTE — Anesthesia Postprocedure Evaluation (Signed)
  Anesthesia Post-op Note  Patient: Amanda Lewis  Procedure(s) Performed: Procedure(s) (LRB): AMPUTATION ABOVE KNEE (Left)  Patient Location: PACU  Anesthesia Type: General  Level of Consciousness: awake, alert  and oriented  Airway and Oxygen Therapy: Patient Spontanous Breathing and Patient connected to nasal cannula oxygen  Post-op Pain: none  Post-op Assessment: Post-op Vital signs reviewed and Patient's Cardiovascular Status Stable  Post-op Vital Signs: stable  Complications: No apparent anesthesia complications

## 2012-01-12 NOTE — Anesthesia Procedure Notes (Signed)
Procedure Name: Intubation Date/Time: 01/12/2012 9:04 AM Performed by: Darcey Nora B Pre-anesthesia Checklist: Patient identified, Emergency Drugs available, Suction available and Patient being monitored Patient Re-evaluated:Patient Re-evaluated prior to inductionOxygen Delivery Method: Circle system utilized Preoxygenation: Pre-oxygenation with 100% oxygen Intubation Type: IV induction Ventilation: Mask ventilation without difficulty Laryngoscope Size: Mac and 3 Grade View: Grade I Tube type: Oral Tube size: 7.5 mm Number of attempts: 1 Airway Equipment and Method: Stylet Placement Confirmation: ETT inserted through vocal cords under direct vision,  breath sounds checked- equal and bilateral and positive ETCO2 Secured at: 21 (cm at gum) cm Tube secured with: Tape Dental Injury: Teeth and Oropharynx as per pre-operative assessment

## 2012-01-12 NOTE — Op Note (Signed)
OPERATIVE REPORT  DATE OF SURGERY: 01/12/2012  PATIENT:  Amanda Lewis,  56 y.o. female  PRE-OPERATIVE DIAGNOSIS:  Complex Regional Pain Syndrome with Equinovarus Contracture Left Foot  POST-OPERATIVE DIAGNOSIS:  Complex Regional Pain Syndrome with Equinovarus Contracture Left Foot  PROCEDURE:  Procedure(s): AMPUTATION ABOVE KNEE left  SURGEON:  Surgeon(s): Nadara Mustard, MD  ANESTHESIA:   general  EBL:  Minimal ML  SPECIMEN:  Source of Specimen:  Left leg  TOURNIQUET:  * No tourniquets in log *  PROCEDURE DETAILS: Patient is a 57 year old woman with chronic complex regional pain syndrome of the left lower extremity she has equinovarus contracture of the foot she is unable to weight-bear even sheets slightly touching her leg is painful. She has undergone prolonged conservative therapy including pain management injections and due to failure of conservative treatment unable to perform activities of daily living patient wished to proceed with above-knee amputation. Risks and benefits of surgery were discussed including infection neurovascular injury persistent complex regional pain symptoms need for additional surgery. Patient states she understands and wished to proceed at this time. Description of procedure patient brought to or room for and underwent a general anesthetic. After adequate levels of anesthesia were obtained patient's left lower extremity was prepped using DuraPrep draped into a sterile field in the foot and leg were draped out into an impervious stockinette. A fishmouth incision was made over the distal femur. Dissection was carried down to the popliteal vessels. These were clamped after transecting the femur. The sciatic nerve was pulled cut and allowed to retract. The vascular bundles were suture ligated x2 each with 2-0 silk. Hemostasis was obtained. The deep and superficial fascial layers were closed using #1 PDS. The skin was closed using approximate staples. The wound was  covered with Adaptic orthopedic sponges ABDs dressing Kerlix and Coban. Patient was extubated taken to the PACU in stable condition.  PLAN OF CARE: Admit to inpatient   PATIENT DISPOSITION:  PACU - hemodynamically stable.   Nadara Mustard, MD 01/12/2012 9:41 AM

## 2012-01-12 NOTE — Transfer of Care (Signed)
Immediate Anesthesia Transfer of Care Note  Patient: Amanda Lewis  Procedure(s) Performed: Procedure(s) (LRB): AMPUTATION ABOVE KNEE (Left)  Patient Location: PACU  Anesthesia Type: General  Level of Consciousness: awake, alert , oriented and patient cooperative  Airway & Oxygen Therapy: Patient Spontanous Breathing and Patient connected to nasal cannula oxygen  Post-op Assessment: Report given to PACU RN, Post -op Vital signs reviewed and stable and Patient moving all extremities  Post vital signs: Reviewed and stable  Complications: No apparent anesthesia complications

## 2012-01-13 ENCOUNTER — Encounter (HOSPITAL_COMMUNITY): Payer: Self-pay | Admitting: Orthopedic Surgery

## 2012-01-13 DIAGNOSIS — S78119A Complete traumatic amputation at level between unspecified hip and knee, initial encounter: Secondary | ICD-10-CM

## 2012-01-13 DIAGNOSIS — G90529 Complex regional pain syndrome I of unspecified lower limb: Secondary | ICD-10-CM

## 2012-01-13 DIAGNOSIS — G90522 Complex regional pain syndrome I of left lower limb: Secondary | ICD-10-CM | POA: Diagnosis present

## 2012-01-13 HISTORY — DX: Complex regional pain syndrome i of left lower limb: G90.522

## 2012-01-13 MED ORDER — WARFARIN SODIUM 6 MG PO TABS
6.0000 mg | ORAL_TABLET | Freq: Once | ORAL | Status: AC
  Administered 2012-01-13: 6 mg via ORAL
  Filled 2012-01-13: qty 1

## 2012-01-13 NOTE — Progress Notes (Signed)
Subjective: Pt comfortable at present.  OOB with PT.  Discussed short term NHP and she has spoken with staff at [facility] and wishes to go there at discharge.   Objective: Vital signs in last 24 hours: Temp:  [97.1 F (36.2 C)-100.6 F (38.1 C)] 98.9 F (37.2 C) (05/16 0539) Pulse Rate:  [99-138] 114  (05/16 0539) Resp:  [20] 20  (05/16 0539) BP: (119-139)/(67-90) 119/90 mmHg (05/16 0539) SpO2:  [90 %-98 %] 98 % (05/16 0539)  Intake/Output from previous day: 05/15 0701 - 05/16 0700 In: 990 [P.O.:240; I.V.:750] Out: 250 [Urine:250] Intake/Output this shift: Total I/O In: -  Out: 300 [Urine:300]   Basename 01/12/12 0757  HGB 14.1    Basename 01/12/12 0757  WBC 9.9  RBC 4.80  HCT 43.6  PLT 243    Basename 01/12/12 0757  NA 141  K 3.4*  CL 102  CO2 31  BUN 7  CREATININE 0.64  GLUCOSE 109*  CALCIUM 10.3    Basename 01/13/12 0525 01/12/12 0757  LABPT -- --  INR 1.12 1.03    Incision: dressing C/D/I  Assessment/Plan: POD 1 AKA Left.   [facility] for rehab,  Pt request [facility]   Micael Barb M 01/13/2012, 11:02 AM

## 2012-01-13 NOTE — Evaluation (Signed)
Occupational Therapy Evaluation Patient Details Name: Amanda Lewis MRN: [id number] DOB: 02-Jul-1956 Today's Date: 01/13/2012 Time: 4098-1191 OT Time Calculation (min): 27 min  OT Assessment / Plan / Recommendation Clinical Impression  56 yr old female admitted with ischemic LLE changes over the past year and now with new left AKA.  Pt currently with decreased overall independence with basic selfcare tasks compared to normal.  Will benefit from acute OT services to help increase independence.  Feel pt will also benefit from follow-up CIR level therapies to reach this level before dischargin home with 24 hour supervision..    OT Assessment  Patient needs continued OT Services    Follow Up Recommendations  Inpatient Rehab       Equipment Recommendations  Defer to next venue    Recommendations for Other Services Rehab consult  Frequency  Min 2X/week    Precautions / Restrictions Precautions Precautions: Fall Restrictions Weight Bearing Restrictions: Yes LLE Weight Bearing: Non weight bearing   Pertinent Vitals/Pain HR 115 at rest increased to 144 after transfer back to EOB from 3:1.  O2 sats 93-96% on 2ls nasal cannula    ADL  Eating/Feeding: Simulated;Independent Where Assessed - Eating/Feeding: Edge of bed Grooming: Simulated;Set up Where Assessed - Grooming: Unsupported sitting Upper Body Bathing: Simulated;Set up Where Assessed - Upper Body Bathing: Unsupported sitting Lower Body Bathing: Moderate assistance Where Assessed - Lower Body Bathing: Supported sit to stand Upper Body Dressing: Simulated;Set up Where Assessed - Upper Body Dressing: Unsupported sitting Lower Body Dressing: Simulated;Moderate assistance Where Assessed - Lower Body Dressing: Unsupported sit to stand Toilet Transfer: Performed;Moderate assistance Toilet Transfer Method: Stand pivot Toilet Transfer Equipment: Bedside commode Toileting - Clothing Manipulation and Hygiene: Performed;Moderate  assistance Where Assessed - Toileting Clothing Manipulation and Hygiene: Sit to stand from 3-in-1 or toilet Tub/Shower Transfer Method: Not assessed Transfers/Ambulation Related to ADLs: Pt currently mod assist for functional stand pivot transfers without use of the RW. ADL Comments: Pt with slight difficulty lifting the LLE to cross and donn her gripper sock.  One LOB posteriorly and to the right when attempting to do this.  HR increased to 144 BPM after transfer from 3:1 back to bed.      OT Diagnosis: Generalized weakness  OT Problem List: Decreased strength;Decreased activity tolerance;Impaired balance (sitting and/or standing);Pain;Decreased knowledge of use of DME or AE OT Treatment Interventions: Self-care/ADL training;Therapeutic activities;Therapeutic exercise;DME and/or AE instruction;Balance training;Patient/family education   OT Goals Acute Rehab OT Goals OT Goal Formulation: With patient Time For Goal Achievement: 01/27/12 Potential to Achieve Goals: Good ADL Goals Pt Will Perform Lower Body Bathing: with supervision;Sit to stand from bed;Sit to stand from chair ADL Goal: Lower Body Bathing - Progress: Goal set today Pt Will Perform Lower Body Dressing: with supervision;Sit to stand from bed;Sit to stand from chair ADL Goal: Lower Body Dressing - Progress: Goal set today Pt Will Transfer to Toilet: with supervision;with DME;3-in-1 ADL Goal: Toilet Transfer - Progress: Goal set today Pt Will Perform Toileting - Clothing Manipulation: with supervision;Sitting on 3-in-1 or toilet;Standing ADL Goal: Toileting - Clothing Manipulation - Progress: Goal set today Pt Will Perform Toileting - Hygiene: with modified independence;Leaning right and/or left on 3-in-1/toilet ADL Goal: Toileting - Hygiene - Progress: Goal set today  Visit Information  Last OT Received On: 01/13/12    Subjective Data  Subjective: "My brother in law will be with me" Patient Stated Goal: Get better and do  for herself   Prior Functioning  Home Living Lives With: Alone (  brother in law always there) Available Help at Discharge: Family;Available 24 hours/day Type of Home:  (trailer) Home Access: Ramped entrance Home Layout: One level Bathroom Shower/Tub: Tub/shower unit;Curtain (sponge bathes) Bathroom Toilet: Standard (uses BSC) Home Adaptive Equipment: Bedside commode/3-in-1;Hospital bed;Walker - four wheeled;Wheelchair - manual Prior Function Level of Independence: Needs assistance Needs Assistance: Bathing;Meal Prep (meals on wheels) Bath: Minimal Meal Prep:  (meals on wheels) Able to Take Stairs?: No Driving: No Communication Communication: No difficulties Dominant Hand: Right    Cognition  Overall Cognitive Status: Appears within functional limits for tasks assessed/performed Arousal/Alertness: Awake/alert Orientation Level: Appears intact for tasks assessed Behavior During Session: [facility] for tasks performed    Extremity/Trunk Assessment Right Upper Extremity Assessment RUE ROM/Strength/Tone: Within functional levels RUE Sensation: WFL - Light Touch RUE Coordination: WFL - gross/fine motor Left Upper Extremity Assessment LUE ROM/Strength/Tone: Within functional levels LUE Sensation: WFL - Light Touch LUE Coordination: WFL - gross/fine motor Trunk Assessment Trunk Assessment: Normal   Mobility Bed Mobility Bed Mobility: Rolling Right Rolling Right: 5: Supervision;With rail Supine to Sit: 5: Supervision;HOB flat;With rails Transfers Transfers: Sit to Stand Sit to Stand: Without upper extremity assist;From bed;From chair/3-in-1;3: Mod assist   Exercise    Balance Static Sitting Balance Static Sitting - Balance Support: Right upper extremity supported;Left upper extremity supported Static Sitting - Level of Assistance: 5: Stand by assistance Static Standing Balance Static Standing - Balance Support: Right upper extremity supported;Left upper extremity supported Static  Standing - Level of Assistance: 3: Mod assist  End of Session OT - End of Session Activity Tolerance: Patient tolerated treatment well Patient left: in bed;with call bell/phone within reach   [facility] 01/13/2012, 3:34 PM

## 2012-01-13 NOTE — Consult Note (Signed)
Physical Medicine and Rehabilitation Consult Reason for Consult: Left above-knee amputation Referring Phsyician: Dr. Vernia Buff is an 56 y.o. female.   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 Patient lives alone was receiving assistance from [place] [facility]. She has undergone prolonged conservative therapy including pain syndrome injections and no relief noted. 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 Lajoyce Corners. Postoperative pain control with Duragesic patch 125 mcg as well as Dilaudid. Placed on Coumadin for DVT prophylaxis. Physical and occupational therapy pending. M.D. has requested physical medicine rehabilitation consult to consider inpatient rehabilitation services  Review of Systems  Cardiovascular: Positive for leg swelling.  Musculoskeletal: Positive for joint pain.  Psychiatric/Behavioral: Positive for depression.  All other systems reviewed and are negative.   Past Medical History  Diagnosis Date  . Stroke 1997  . Asthma     exertional  . COPD (chronic obstructive pulmonary disease)     usesO2 24/7  . Anemia   . Dysuria-frequency syndrome   . GERD (gastroesophageal reflux disease)   . Arthritis     arms  . Rotator cuff tear, right   . Anxiety     on treatment  . Chronic lower back pain   . RSD lower limb     left lower leg  . Recurrent upper respiratory infection (URI)   . Pneumonia     in march  . Sleep apnea 2010    uses CPAP nightly- Dr Ardelle Park in [place]  . Chronic kidney disease     "Floating stone currentl:- 01/12/12  . Headache     intermittent migraines, Last one 12/29/2011  . Depression    Past Surgical History  Procedure Date  . Orif ankle fracture 1995  . Appendectomy   . Abdominal hysterectomy 2003  . Fracture surgery   . Morphine pump   . Morphine pump removed 2006    Family History  Problem Relation Age of Onset  . Anesthesia problems Neg Hx    Social History:  reports that she quit smoking about 3 years ago. Her smoking use included Cigarettes. She has a 70 pack-year smoking history. She has never used smokeless tobacco. She reports that she does not drink alcohol or use illicit drugs. Allergies:  Allergies  Allergen Reactions  . Aspirin Hives  . Bactrim (Sulfamethoxazole-Tmp Ds) Hives  . Nitroglycerin Other (See Comments)    Blood pressure drops dangerously low  . Percodan (Oxycodone-Aspirin) Hives  . Tape Rash    Adhesive   Medications Prior to Admission  Medication Sig Dispense Refill  . albuterol (PROVENTIL HFA;VENTOLIN HFA) 108 (90 BASE) MCG/ACT inhaler Inhale 2 puffs into the lungs every 6 (six) hours as needed. For shortness of breath       . Cholecalciferol (VITAMIN D3) 5000 UNITS TABS Take 5,000 Units by mouth every other day.       . cyclobenzaprine (FLEXERIL) 10 MG tablet Take 10 mg by mouth 3 (three) times daily. scheduled       . diazepam (VALIUM) 10 MG tablet Take 10 mg by mouth every 8 (eight) hours as needed. For anxiety      . DULoxetine (CYMBALTA) 60 MG capsule Take 60 mg by mouth daily.        Marland Kitchen esomeprazole (NEXIUM) 40 MG capsule Take 40 mg by mouth daily.        . fentaNYL (  DURAGESIC - DOSED MCG/HR) 100 MCG/HR Place 1 patch onto the skin every other day as needed. For pain alternating with patch      . fentaNYL (DURAGESIC - DOSED MCG/HR) 25 MCG/HR Place 1 patch onto the skin every other day as needed. For pain. Alternating with patch      . furosemide (LASIX) 20 MG tablet Take 20 mg by mouth daily.        . hydrochlorothiazide (HYDRODIURIL) 25 MG tablet Take 25 mg by mouth daily.        Marland Kitchen oxycodone (ROXICODONE) 30 MG immediate release tablet Take 30 mg by mouth every 4 (four) hours as needed. For pain       . QUEtiapine (SEROQUEL) 25 MG tablet Take 25 mg by mouth 2 (two) times daily.       . theophylline  (THEODUR) 300 MG 12 hr tablet Take 300 mg by mouth 2 (two) times daily.          Home: Home Living Lives With: Alone (brother in law always there) Available Help at Discharge: Family;Available 24 hours/day Type of Home:  (trailer) Home Access: Ramped entrance Home Layout: One level Bathroom Shower/Tub: Tub/shower unit;Curtain (sponge bathes) Bathroom Toilet:  (uses BSC) Home Adaptive Equipment: Bedside commode/3-in-1;Hospital bed;Walker - four wheeled;Wheelchair - manual  Functional History: Prior Forensic scientist: Minimal Functional Status:  Mobility:          ADL:    Cognition: Cognition Orientation Level: Oriented X4    Blood pressure 119/90, pulse 114, temperature 98.9 F (37.2 C), temperature source Oral, resp. rate 20, height 5' 5.5" (1.664 m), weight 81.647 kg (180 lb), SpO2 98.00%. Physical Exam  Vitals reviewed. Constitutional: She is oriented to person, place, and time.  HENT:  Head: Normocephalic.  Neck: Neck supple. No thyromegaly present.  Cardiovascular: Regular rhythm.   Pulmonary/Chest: No respiratory distress. She has no wheezes.  Abdominal: She exhibits no distension. There is no tenderness.  Neurological: She is alert and oriented to person, place, and time.  Skin:       Left lower extremity dressed  Psychiatric: She has a normal mood and affect.  motor strength is 4/5 in bilateral deltoid, biceps, triceps, grip Right lower extremity strength is 4/5 in the hip flexors knee extensors ankle dorsiflexors plantar flexors Left lower extremity stump with minimal tenderness to palpation. She has 3 minus strength in the hip flexors hip extensors hip adductors hip abductors  Results for orders placed during the hospital encounter of 01/12/12 (from the past 24 hour(s))  PROTIME-INR     Status: Normal   Collection Time   01/13/12  5:25 AM      Component Value Range   Prothrombin Time 14.6  11.6 - 15.2 (seconds)   INR 1.12  0.00 - 1.49    No results  found.  Assessment/Plan: Diagnosis: left transfemoral knee amputation related to severe chronic regional pain syndrome1 left lower extremity 1. Does the need for close, 24 hr/day medical supervision in concert with the patient's rehab needs make it unreasonable for this patient to be served in a less intensive setting? Yes 2. Co-Morbidities requiring supervision/potential complications: acute on chronic pain management, hypertension,anemia, sleep apnea 3. Due to bladder management, bowel management, safety, skin/wound care, disease management, medication administration, pain management and patient education, does the patient require 24 hr/day rehab nursing? Yes 4. Does the patient require coordinated care of a physician, rehab nurse, PT (1-2 hrs/day, 5 days/week) and OT (1-2 hrs/day, 5 days/week) to  address physical and functional deficits in the context of the above medical diagnosis(es)? Yes Addressing deficits in the following areas: balance, endurance, locomotion, strength, transferring, bowel/bladder control, bathing, dressing and toileting 5. Can the patient actively participate in an intensive therapy program of at least 3 hrs of therapy per day at least 5 days per week? Yes 6. The potential for patient to make measurable gains while on inpatient rehab is good 7. Anticipated functional outcomes upon discharge from inpatient rehab are supervision mobility with PT, supervision ADLs with OT, not applicable with SLP. 8. Estimated rehab length of stay to reach the above functional goals is: 10 days 9. Does the patient have adequate social supports to accommodate these discharge functional goals? Potentially 10. Anticipated D/C setting: Home 11. Anticipated post D/C treatments: HH therapy 12. Overall Rehab/Functional Prognosis: good  RECOMMENDATIONS: This patient's condition is appropriate for continued rehabilitative care in the following setting: if patient has 24 7 supervision at home and then  would be a good CIR candidate, if not will need to find another postacute venue Patient has agreed to participate in recommended program. Yes Note that insurance prior authorization may be required for reimbursement for recommended care.  Comment:   ANGIULLI,DANIEL J. 01/13/2012

## 2012-01-13 NOTE — Clinical Social Work Placement (Addendum)
    Clinical Social Work Department CLINICAL SOCIAL WORK PLACEMENT NOTE 01/13/2012  Patient:  [name], [name]  Account Number:  [id number] Admit date:  01/12/2012  Clinical Social Worker:  Unk Lightning, LCSW  Date/time:  01/13/2012 04:00 PM  Clinical Social Work is seeking post-discharge placement for this patient at the following level of care:   SKILLED NURSING   (*CSW will update this form in Epic as items are completed)   01/13/2012  Patient/family provided with [facility] Department of Clinical Social Work's list of facilities offering this level of care within the geographic area requested by the patient (or if unable, by the patient's family).  01/13/2012  Patient/family informed of their freedom to choose among providers that offer the needed level of care, that participate in Medicare, Medicaid or managed care program needed by the patient, have an available bed and are willing to accept the patient.  01/13/2012  Patient/family informed of [facility]' ownership interest in [facility], as well as of the fact that they are under no obligation to receive care at this facility.  PASARR submitted to EDS on 01/17/12 PASARR number received from EDS on 01/17/12  FL2 transmitted to all facilities in geographic area requested by pt/family on  01/13/2012 FL2 transmitted to all facilities within larger geographic area on   Patient informed that his/her managed care company has contracts with or will negotiate with  certain facilities, including the following:     Patient/family informed of bed offers received:01/14/12; 01/16/12-not interested in choice given Patient chooses bed at [facility] Physician recommends and patient chooses bed at    Patient to be transferred to [facility] on  01/17/12 Patient to be transferred to facility by [facility]  The following physician request were entered in Epic:   Additional Comments: 01/13/12-CSW to submit pasarr after MD signs 30 day  note

## 2012-01-13 NOTE — Progress Notes (Signed)
Physical Therapy Evaluation Note  Past Medical History  Diagnosis Date  . Stroke 1997  . Asthma     exertional  . COPD (chronic obstructive pulmonary disease)     usesO2 24/7  . Anemia   . Dysuria-frequency syndrome   . GERD (gastroesophageal reflux disease)   . Arthritis     arms  . Rotator cuff tear, right   . Anxiety     on treatment  . Chronic lower back pain   . RSD lower limb     left lower leg  . Recurrent upper respiratory infection (URI)   . Pneumonia     in march  . Sleep apnea 2010    uses CPAP nightly- Dr Ardelle Park in [place]  . Chronic kidney disease     "Floating stone currentl:- 01/12/12  . Headache     intermittent migraines, Last one 12/29/2011  . Depression     Past Surgical History  Procedure Date  . Orif ankle fracture 1995  . Appendectomy   . Abdominal hysterectomy 2003  . Fracture surgery   . Morphine pump   . Morphine pump removed 2006     01/13/12 1029  PT Visit Information  Last PT Received On 01/13/12  Assistance Needed +2 (due to pt's center of gravity off due to L LE amputation)  PT Time Calculation  PT Start Time 1029  PT Stop Time 1051  PT Time Calculation (min) 22 min  Subjective Data  Subjective Pt received supine in bed with c/o of 7/10 L LE pain but motivated to get out of bed.  Patient Stated Goal "I am tired of this bed, I've been in it for 4 years. I'm ready to move."  Precautions  Precautions Fall  Restrictions  Weight Bearing Restrictions Yes  LLE Weight Bearing NWB  Home Living  Lives With Alone (brother in law always there)  Available Help at Discharge Family;Available 24 hours/day  Type of Home (trailer)  Home Access Ramped entrance  Home Layout One level  Bathroom Shower/Tub Tub/shower unit;Curtain (sponge bathes)  Bathroom Toilet (uses [facility])  Home Adaptive Equipment Bedside commode/3-in-1;Hospital bed;Walker - four wheeled;Wheelchair - manual  Prior Function  Level of Independence Needs assistance  Needs  Assistance Bathing;Meal Prep (meals on wheels)  Bath Minimal  Meal Prep (meals on wheels)  Able to Take Stairs? No  Driving No  Communication  Communication No difficulties  Cognition  Overall Cognitive Status Appears within functional limits for tasks assessed/performed  Arousal/Alertness Awake/alert  Orientation Level Oriented X4 / Intact  Behavior During Session [facility] for tasks performed  Right Upper Extremity Assessment  RUE ROM/Strength/Tone WFL  Left Upper Extremity Assessment  LUE ROM/Strength/Tone WFL  Right Lower Extremity Assessment  RLE ROM/Strength/Tone WFL  Left Lower Extremity Assessment  LLE ROM/Strength/Tone Deficits;Due to pain (pt AKA)  LLE ROM/Strength/Tone Deficits pt can initiate hip flex, abd, and glut squeezes  Trunk Assessment  Trunk Assessment Normal  Bed Mobility  Bed Mobility Supine to Sit  Supine to Sit 3: Mod assist;HOB flat;With rails  Details for Bed Mobility Assistance assist for trunk elevation  Transfers  Transfers Sit to Stand;Stand to Sit;Stand Pivot Transfers  Sit to Stand With upper extremity assist;From elevated surface;From bed;1: +2 Total assist  Sit to Stand: Patient Percentage 60%  Stand to Sit 3: Mod assist;With upper extremity assist;With armrests;To chair/3-in-1  Stand Pivot Transfers 1: +2 Total assist (with RW)  Stand Pivot Transfers: Patient Percentage 60%  Details for Transfer Assistance  pt with +  L lateral lean requiring assist to maintain alignment and RW on floor. assist for walker management. Patient able to "hop"on R LE.  Ambulation/Gait  Ambulation/Gait Assistance Not tested (comment) (pt "hopped" x 5 for stand pvt to chair)  Wheelchair Mobility  Wheelchair Mobility No  Balance  Balance Assessed Yes  Static Sitting Balance  Static Sitting - Balance Support No upper extremity supported;Feet supported (R LE)  Static Sitting - Level of Assistance 5: Stand by assistance  Static Sitting - Comment/# of Minutes 3 min    Static Standing Balance  Static Standing - Balance Support Bilateral upper extremity supported  Static Standing - Level of Assistance 3: Mod assist  Static Standing - Comment/# of Minutes + L lateral lean due to center of gravity off from L LE AKA. worked on finding Product/process development scientist Lower Extremity  General Exercises - Lower Extremity  Gluteal Sets AROM;Both;5 reps  Hip ABduction/ADduction AROM;Left;5 reps;Supine  Hip Flexion/Marching AROM;Both;10 reps;Supine  PT - End of Session  Equipment Utilized During Treatment Gait belt (R shoe for support during std pvt transfer)  Activity Tolerance Patient tolerated treatment well  Patient left in chair;with call bell/phone within reach  Nurse Communication Mobility status  PT Assessment  Clinical Impression Statement Pt s/p L AKA. Patient very motivated and desires to be more mobile secondary to being "bed ridden" for 4 years. Patient excited to not have to"deal" with the L LE. Patient demonstrates good potential for inpatient rehab to improve overall strength and endurance to achieve mod I function for transition home. Patient's brother in-law to be available 24/7 when patient goes home. Patient extremely motivated to progress OOB mobility.  PT Recommendation/Assessment Patient needs continued PT services  PT Problem List Decreased strength;Decreased range of motion;Decreased activity tolerance;Decreased balance;Decreased mobility;Impaired sensation  PT Therapy Diagnosis  Difficulty walking;Abnormality of gait;Generalized weakness;Acute pain  PT Plan  PT Frequency Min 5X/week  PT Treatment/Interventions DME instruction;Gait training;Functional mobility training;Therapeutic activities;Therapeutic exercise;Balance training  PT Recommendation  Recommendations for Other Services Rehab consult (spoke with Velna Hatchet, [place] re: placing rehab order)  Follow Up Recommendations Inpatient Rehab  Equipment Recommended Defer to next venue   Individuals Consulted  Consulted and Agree with Results and Recommendations Patient  Acute Rehab PT Goals  PT Goal Formulation With patient  Time For Goal Achievement 01/27/12  Potential to Achieve Goals Good  Pt will go Supine/Side to Sit with modified independence;with HOB 0 degrees  PT Goal: Supine/Side to Sit - Progress Goal set today  Pt will go Sit to Stand with min assist;with upper extremity assist  PT Goal: Sit to Stand - Progress Goal set today  Pt will Transfer Bed to Chair/Chair to Bed with min assist (with RW.)  PT Transfer Goal: Bed to Chair/Chair to Bed - Progress Goal set today  Pt will Stand with supervision;3 - 5 min;with bilateral upper extremity support (with RW.)  PT Goal: Stand - Progress Goal set today  Pt will Ambulate 16 - 50 feet;with supervision;with rolling walker  PT Goal: Ambulate - Progress Goal set today  Pt will Perform Home Exercise Program Independently  PT Goal: Perform Home Exercise Program - Progress Goal set today  Written Expression  Dominant Hand Right    Pain: 7/10 L LE pain  Lewis Shock, PT, DPT Pager #: [phone] Office #: [phone]

## 2012-01-13 NOTE — Progress Notes (Signed)
Into room to discuss with patient CIR after rehab MD consult. Patient states, she is from [facility] as is all of her relatives and she would really like to go to [facility] for therapies. Updated RNCM of this. PT then contacts me back stating that patient would like to now think about possible CIR admission and would like for [place] to follow up in am. Toni Amend adm coordinator [phone].

## 2012-01-13 NOTE — Progress Notes (Signed)
ANTICOAGULATION CONSULT NOTE - Follow Up Consult  Pharmacy Consult for Coumadin Indication: VTE prophylaxis  Allergies  Allergen Reactions  . Aspirin Hives  . Bactrim (Sulfamethoxazole-Tmp Ds) Hives  . Nitroglycerin Other (See Comments)    Blood pressure drops dangerously low  . Percodan (Oxycodone-Aspirin) Hives  . Tape Rash    Adhesive    Patient Measurements: Height: 5' 5.5" (166.4 cm) Weight: 180 lb (81.647 kg) IBW/kg (Calculated) : 58.15  Heparin Dosing Weight:   Vital Signs: Temp: 98.9 F (37.2 C) (05/16 0539) BP: 119/90 mmHg (05/16 0539) Pulse Rate: 114  (05/16 0539)  Labs:  Basename 01/13/12 0525 01/12/12 0757  HGB -- 14.1  HCT -- 43.6  PLT -- 243  APTT -- 35  LABPROT 14.6 13.7  INR 1.12 1.03  HEPARINUNFRC -- --  CREATININE -- 0.64  CKTOTAL -- --  CKMB -- --  TROPONINI -- --    Estimated Creatinine Clearance: 82.8 ml/min (by C-G formula based on Cr of 0.64).   Medications:  Scheduled:    .  ceFAZolin (ANCEF) IV  2 g Intravenous Q6H  . coumadin book   Does not apply Once  . cyclobenzaprine  10 mg Oral TID  . DULoxetine  60 mg Oral Daily  . fentaNYL  100 mcg Transdermal Q48H  . fentaNYL  25 mcg Transdermal Q48H  . furosemide  20 mg Oral Daily  . gabapentin  300 mg Oral TID  . hydrochlorothiazide  25 mg Oral Daily  . HYDROmorphone      . oxyCODONE      . pantoprazole  40 mg Oral Q1200  . QUEtiapine  25 mg Oral BID  . theophylline  300 mg Oral Q12H  . warfarin  6 mg Oral ONCE-1800  . warfarin   Does not apply Once  . Warfarin - Pharmacist Dosing Inpatient   Does not apply q1800    Assessment: 56yof to start Coumadin for VTE prophylaxis s/p AKA.  INR essentially unchanged s/p one dose of warfarin, as expected.  No complications noted.   - Baseline INR: 1.03  - H/H and Plts wnl  - Warfarin points: 4  Goal of Therapy:  INR 2-3 Monitor platelets by anticoagulation protocol: Yes   Plan:  -Repeat Coumadin 6 mg po x1 today -Daily  INR  Laurice Kimmons L. Illene Bolus, PharmD, BCPS Clinical Pharmacist Pager: [phone] 01/13/2012 12:48 PM

## 2012-01-13 NOTE — Clinical Social Work Psychosocial (Signed)
     Clinical Social Work Department BRIEF PSYCHOSOCIAL ASSESSMENT 01/13/2012  Patient:  Amanda Lewis, Amanda Lewis     Account Number:  [id number]     Admit date:  01/12/2012  Clinical Social Worker:  Dennison Bulla  Date/Time:  01/13/2012 04:00 PM  Referred by:  Physician  Date Referred:  01/13/2012 Referred for  SNF Placement   Other Referral:   Interview type:  Patient Other interview type:    PSYCHOSOCIAL DATA Living Status:  ALONE Admitted from facility:   Level of care:   Primary support name:  Bobby Primary support relationship to patient:  SIBLING Degree of support available:   Adequate    CURRENT CONCERNS Current Concerns  Post-Acute Placement   Other Concerns:    SOCIAL WORK ASSESSMENT / PLAN CSW received referral from RN reporting that patient would need SNF at dc. CSW reviewed chart which stated PT and OT recommended therapy for patient. CSW met with patient at bedside. No visitors were present.    CSW met with patient and introduced myself along with explaining CSW role. CSW provided patient with SNF list and patient reported she was interested in [facility] in [place]. CSW explained process and received permission to fax out.    CSW completed FL2, faxed out and will submit for pasarr after MD signs 30 day note.   Assessment/plan status:  Psychosocial Support/Ongoing Assessment of Needs Other assessment/ plan:   Information/referral to community resources:   SNF list    PATIENTS/FAMILYS RESPONSE TO PLAN OF CARE: Patient was alert and oriented. Patient requested SNF. Patient stated she did not want CSW to discuss her condition with any family. Patient has CSW contact information.

## 2012-01-14 LAB — PROTIME-INR
INR: 1.54 — ABNORMAL HIGH (ref 0.00–1.49)
Prothrombin Time: 18.8 seconds — ABNORMAL HIGH (ref 11.6–15.2)

## 2012-01-14 MED ORDER — WARFARIN SODIUM 5 MG PO TABS
5.0000 mg | ORAL_TABLET | Freq: Once | ORAL | Status: AC
  Administered 2012-01-14: 5 mg via ORAL
  Filled 2012-01-14: qty 1

## 2012-01-14 MED ORDER — SENNOSIDES-DOCUSATE SODIUM 8.6-50 MG PO TABS
1.0000 | ORAL_TABLET | Freq: Every evening | ORAL | Status: DC | PRN
  Administered 2012-01-15: 1 via ORAL
  Filled 2012-01-14: qty 1
  Filled 2012-01-14: qty 2

## 2012-01-14 MED ORDER — DOCUSATE SODIUM 100 MG PO CAPS
100.0000 mg | ORAL_CAPSULE | Freq: Two times a day (BID) | ORAL | Status: DC
  Administered 2012-01-14 – 2012-01-17 (×8): 100 mg via ORAL
  Filled 2012-01-14 (×11): qty 1

## 2012-01-14 NOTE — Progress Notes (Signed)
Physical Therapy Treatment Note   01/14/12 0814  PT Visit Information  Last PT Received On 01/14/12  Assistance Needed +2  PT Time Calculation  PT Start Time 0814  PT Stop Time 0834  PT Time Calculation (min) 20 min  Subjective Data  Subjective Pt received supine in bed with report "I want to go to [facility]" Pt feels better being closer to home where family can support her.  Precautions  Precautions Fall  Restrictions  LLE Weight Bearing NWB  Cognition  Overall Cognitive Status Appears within functional limits for tasks assessed/performed  Arousal/Alertness Awake/alert  Orientation Level Appears intact for tasks assessed  Behavior During Session [facility] for tasks performed  Bed Mobility  Bed Mobility Supine to Sit  Rolling Right 5: Supervision;With rail  Supine to Sit 5: Supervision;HOB flat;With rails  Transfers  Transfers Sit to Stand;Stand to Sit  Sit to Stand 4: Min assist;From bed;With upper extremity assist (up to RW)  Stand to Sit 4: Min assist  Details for Transfer Assistance v/c's for hand placement, minA to maintain balance during transition of hand from bed to walker  Ambulation/Gait  Ambulation/Gait Assistance 4: Min assist  Ambulation Distance (Feet) 6 Feet (and then 12 feet s/p seated rest break)  Assistive device Rolling walker  Ambulation/Gait Assistance Details pt with improved ambulation tolerance this date. Patient with episodes of "whooziness"  requiring seated rest break. patient with + SOB with activity however pt deconditionef from being sedentary.  Gait Pattern Step-to pattern  Gait velocity slow  Static Standing Balance  Static Standing - Balance Support Bilateral upper extremity supported  Static Standing - Level of Assistance 4: Min assist  Static Standing - Comment/# of Minutes pt with minimal L lateral lean this date compared to yesterday. Patient with improved standing tolerance  Exercises  Exercises (pt reports doing HEP on own in bed, educated  patient on prevention of L hip flex contracture  PT - End of Session  Equipment Utilized During Treatment Gait belt  Activity Tolerance Patient limited by fatigue  Patient left in chair;with call bell/phone within reach  Nurse Communication Mobility status  PT - Assessment/Plan  Comments on Treatment Session Pt progressing towards all goals. Patient con't to be deconditioned and requires assist for ADLs and transfers. Patient to con't to benefit from inpatient rehab or SNF placement to achieve maximal functional recovery to progress OOB mobility for safe home d/c and improved community integration.  PT Plan Discharge plan remains appropriate;Frequency remains appropriate  PT Frequency Min 5X/week  Follow Up Recommendations Skilled nursing facility;Inpatient Rehab  Acute Rehab PT Goals  Time For Goal Achievement 01/27/12  Potential to Achieve Goals Good  PT Goal: Supine/Side to Sit - Progress Progressing toward goal  PT Goal: Sit to Stand - Progress Progressing toward goal  PT Goal: Stand - Progress Progressing toward goal  PT Goal: Ambulate - Progress Progressing toward goal  PT Goal: Perform Home Exercise Program - Progress Progressing toward goal     Pain: pt did not rate but reports L LE pain to be better than yesterday.  Lewis Shock, PT, DPT Pager #: [phone] Office #: [phone]

## 2012-01-14 NOTE — Progress Notes (Signed)
ANTICOAGULATION CONSULT NOTE - Follow Up Consult  Pharmacy Consult for Coumadin Indication: VTE prophylaxis  Allergies  Allergen Reactions  . Aspirin Hives  . Bactrim (Sulfamethoxazole-Tmp Ds) Hives  . Nitroglycerin Other (See Comments)    Blood pressure drops dangerously low  . Percodan (Oxycodone-Aspirin) Hives  . Tape Rash    Adhesive    Patient Measurements: Height: 5' 5.5" (166.4 cm) Weight: 180 lb (81.647 kg) IBW/kg (Calculated) : 58.15  Heparin Dosing Weight:   Vital Signs: Temp: 99.5 F (37.5 C) (05/17 0215) Temp src: Oral (05/17 0215)  Labs:  Basename 01/14/12 0538 01/13/12 0525 01/12/12 0757  HGB -- -- 14.1  HCT -- -- 43.6  PLT -- -- 243  APTT -- -- 35  LABPROT 18.8* 14.6 13.7  INR 1.54* 1.12 1.03  HEPARINUNFRC -- -- --  CREATININE -- -- 0.64  CKTOTAL -- -- --  CKMB -- -- --  TROPONINI -- -- --    Estimated Creatinine Clearance: 82.8 ml/min (by C-G formula based on Cr of 0.64).  Assessment: 56yof on Coumadin for VTE prophylaxis s/p AKA. INR (1.54) is subtherapeutic but is trending up.  - No CBC this AM - No significant bleeding reported  Goal of Therapy:  INR 2-3  Plan:  1. Coumadin 5mg  po x 1 today 2. Follow-up AM INR   Cleon Dew [phone] 01/14/2012,10:40 AM

## 2012-01-14 NOTE — Progress Notes (Signed)
Clinical Social Work  CSW called [facility] and left a message regarding if facility could make a bed offer. CSW will continue to follow.  [place], [place] [phone] (Coverage for Lovette Cliche)

## 2012-01-14 NOTE — Progress Notes (Signed)
Rehab admissions - I spoke with patient this morning.  She would like to go to [facility]F  In [place].  Her brother-in-law is to be her caregiver and this is closer and more convenient for him.  She is comfortable with this decision.  Call me for questions.  #[phone]

## 2012-01-14 NOTE — Progress Notes (Signed)
Subjective: Pain well controlled.  Out of bed to chair today.  Reports she feels so much better than pre op.  Feels [facility] is the best option for her as her family can be involved in her care better with that location.   Pt uses O2 chronically at home.  Currently no SOB,but she is concerned about her "fever".  Temp to 100.2 most likely related to atelectasis and I have advised her to use IS more frequently.  No urinary symptoms.   Objective: Vital signs in last 24 hours: Temp:  [99.5 F (37.5 C)-100.2 F (37.9 C)] 99.5 F (37.5 C) (05/17 0215) Pulse Rate:  [115-140] 120  (05/16 2115) Resp:  [16-18] 18  (05/16 2052) BP: (95-134)/(65-69) 134/65 mmHg (05/16 2052) SpO2:  [93 %-97 %] 97 % (05/16 2052)  Intake/Output from previous day: 05/16 0701 - 05/17 0700 In: 1200 [P.O.:960; I.V.:240] Out: 300 [Urine:300] Intake/Output this shift:     Basename 01/12/12 0757  HGB 14.1    Basename 01/12/12 0757  WBC 9.9  RBC 4.80  HCT 43.6  PLT 243    Basename 01/12/12 0757  NA 141  K 3.4*  CL 102  CO2 31  BUN 7  CREATININE 0.64  GLUCOSE 109*  CALCIUM 10.3    Basename 01/14/12 0538 01/13/12 0525  LABPT -- --  INR 1.54* 1.12    Incision: dressing C/D/I  Assessment/Plan: POD 2 left AKA Mildly elevated temp.  Encouraged IS and CDB exercise.  If continues to have elevations will check wound, but expect it is from her lungs NHP:  [facility] next week   Nickalus Thornsberry M 01/14/2012, 10:09 AM

## 2012-01-14 NOTE — Progress Notes (Signed)
Clinical Social Work  CSW spoke with [facility] who stated they felt they could accept patient but was unsure if they would have a bed available. CSW met with patient to discuss SNF options. Patient was agreeable to look at other facilities in [facility]. CSW expanded the search. MD needs to sign 30 day note before pasarr can be submitted.  [place], [place] [phone]

## 2012-01-15 LAB — PROTIME-INR: Prothrombin Time: 22.2 seconds — ABNORMAL HIGH (ref 11.6–15.2)

## 2012-01-15 MED ORDER — WARFARIN SODIUM 5 MG PO TABS
5.0000 mg | ORAL_TABLET | Freq: Once | ORAL | Status: AC
  Administered 2012-01-15: 5 mg via ORAL
  Filled 2012-01-15: qty 1

## 2012-01-15 NOTE — Progress Notes (Signed)
ANTICOAGULATION CONSULT NOTE - Follow Up Consult  Pharmacy Consult for Coumadin Indication: VTE prophylaxis  Allergies  Allergen Reactions  . Aspirin Hives  . Bactrim (Sulfamethoxazole-Tmp Ds) Hives  . Nitroglycerin Other (See Comments)    Blood pressure drops dangerously low  . Percodan (Oxycodone-Aspirin) Hives  . Tape Rash    Adhesive    Patient Measurements: Height: 5' 5.5" (166.4 cm) Weight: 180 lb (81.647 kg) IBW/kg (Calculated) : 58.15  Heparin Dosing Weight:   Vital Signs: Temp: 99.1 F (37.3 C) (05/18 0617) BP: 114/78 mmHg (05/18 0617) Pulse Rate: 98  (05/18 0617)  Labs:  Basename 01/15/12 0550 01/14/12 0538 01/13/12 0525  HGB -- -- --  HCT -- -- --  PLT -- -- --  APTT -- -- --  LABPROT 22.2* 18.8* 14.6  INR 1.91* 1.54* 1.12  HEPARINUNFRC -- -- --  CREATININE -- -- --  CKTOTAL -- -- --  CKMB -- -- --  TROPONINI -- -- --    Estimated Creatinine Clearance: 82.8 ml/min (by C-G formula based on Cr of 0.64).  Assessment: 56yof on Coumadin for VTE prophylaxis s/p AKA. INR (1.91) is just below goal but is trending up - will continue with Coumadin 5mg .  - No CBC since 5/15 - No significant bleeding reported  Goal of Therapy:  INR 2-3 Monitor platelets by anticoagulation protocol: Yes   Plan:  1. Coumadin 5mg  po x 1 today 2. Follow-up AM INR  Cleon Dew [phone] 01/15/2012,11:04 AM

## 2012-01-15 NOTE — Progress Notes (Signed)
Patient ID: Amanda Lewis, female   DOB: [birth year], 56 y.o.   MRN: [id number] Doing well overall.  Left AKA dressing clean/dry/intact.  Plan For SNF Monday.

## 2012-01-15 NOTE — Progress Notes (Signed)
PT has been refusing CPAP. RT will continue to monitor. 

## 2012-01-15 NOTE — Progress Notes (Signed)
Physical Therapy Treatment Note   01/15/12 1002  PT Visit Information  Last PT Received On 01/15/12  Assistance Needed +2 (for line and chair management)  PT Time Calculation  PT Start Time 1002  PT Stop Time 1026  PT Time Calculation (min) 24 min  Subjective Data  Subjective Pt received supine in bed with report "I need to use the bathroom."  Precautions  Precautions Fall  Restrictions  LLE Weight Bearing NWB  Cognition  Overall Cognitive Status Appears within functional limits for tasks assessed/performed  Arousal/Alertness Awake/alert  Orientation Level Appears intact for tasks assessed  Behavior During Session [facility] for tasks performed  Bed Mobility  Bed Mobility Supine to Sit  Rolling Right 5: Supervision;With rail  Supine to Sit 5: Supervision;HOB flat;With rails  Transfers  Transfers Sit to Stand;Stand to Sit  Sit to Stand 4: Min assist;From bed;With upper extremity assist  Stand to Sit 4: Min assist  Stand Pivot Transfers 4: Min assist (with RW.)  Details for Transfer Assistance patient with improved steadiness however requires minA for walker management during stand pvt transfer  Ambulation/Gait  Ambulation/Gait Assistance 4: Min assist  Ambulation Distance (Feet) 15 Feet (x2, s/p seated rest break x 3 min)  Assistive device Rolling walker  Ambulation/Gait Assistance Details pt on 2 LO2, limited by onset of fatigue  Gait Pattern Step-to pattern  Gait velocity slow  Exercises  Exercises (pt reports con't to do HEP)  PT - End of Session  Equipment Utilized During Treatment Gait belt  Activity Tolerance Patient limited by fatigue  Patient left in chair;with call bell/phone within reach  Nurse Communication Mobility status  PT - Assessment/Plan  Comments on Treatment Session Pt con't to progress towards all goals.   PT Plan Discharge plan remains appropriate;Frequency remains appropriate  PT Frequency Min 5X/week  Follow Up Recommendations Skilled nursing  facility;Inpatient Rehab  Equipment Recommended Defer to next venue  Acute Rehab PT Goals  PT Goal: Supine/Side to Sit - Progress Progressing toward goal  PT Goal: Sit to Stand - Progress Progressing toward goal  PT Transfer Goal: Bed to Chair/Chair to Bed - Progress Progressing toward goal  PT Goal: Stand - Progress Progressing toward goal  PT Goal: Ambulate - Progress Progressing toward goal  PT Goal: Perform Home Exercise Program - Progress Progressing toward goal     Pain: pt reports "Its not to bad" in reference to L residual limb.  Lewis Shock, PT, DPT Pager #: [phone] Office #: [phone]

## 2012-01-16 LAB — PROTIME-INR: Prothrombin Time: 21.9 seconds — ABNORMAL HIGH (ref 11.6–15.2)

## 2012-01-16 MED ORDER — WARFARIN SODIUM 6 MG PO TABS
6.0000 mg | ORAL_TABLET | Freq: Once | ORAL | Status: AC
  Administered 2012-01-16: 6 mg via ORAL
  Filled 2012-01-16: qty 1

## 2012-01-16 NOTE — Progress Notes (Signed)
ANTICOAGULATION CONSULT NOTE - Follow Up Consult  Pharmacy Consult for Coumadin Indication: VTE prophylaxis  Allergies  Allergen Reactions  . Aspirin Hives  . Bactrim (Sulfamethoxazole-Tmp Ds) Hives  . Nitroglycerin Other (See Comments)    Blood pressure drops dangerously low  . Percodan (Oxycodone-Aspirin) Hives  . Tape Rash    Adhesive    Patient Measurements: Height: 5' 5.5" (166.4 cm) Weight: 180 lb (81.647 kg) IBW/kg (Calculated) : 58.15  Heparin Dosing Weight:   Vital Signs: Temp: 98.9 F (37.2 C) (05/19 0519) Temp src: Oral (05/19 0519) BP: 123/86 mmHg (05/19 0519) Pulse Rate: 98  (05/19 0519)  Labs:  Basename 01/16/12 0535 01/15/12 0550 01/14/12 0538  HGB -- -- --  HCT -- -- --  PLT -- -- --  APTT -- -- --  LABPROT 21.9* 22.2* 18.8*  INR 1.88* 1.91* 1.54*  HEPARINUNFRC -- -- --  CREATININE -- -- --  CKTOTAL -- -- --  CKMB -- -- --  TROPONINI -- -- --    Estimated Creatinine Clearance: 82.8 ml/min (by C-G formula based on Cr of 0.64).  Assessment: 56yof on Coumadin for VTE prophylaxis s/p AKA. INR (1.88) is subtherapeutic. INR was trending up nicely with Coumadin 5mg  but dropped overnight.  - No CBC since 5/15 - No significant bleeding reported   Goal of Therapy:  INR 2-3   Plan:  1. Coumadin 6mg  po x 1 today 2. Follow-up AM INR  Cleon Dew [phone] 01/16/2012,12:27 PM

## 2012-01-16 NOTE — Progress Notes (Signed)
Patient ID: Amanda Lewis, female   DOB: 02/11/1956, 56 y.o.   MRN: [id number] Doing well. No acute changes.  To SNF tomorrow.

## 2012-01-17 MED ORDER — FENTANYL 25 MCG/HR TD PT72: 1.0000 | MEDICATED_PATCH | TRANSDERMAL | Status: AC

## 2012-01-17 MED ORDER — WARFARIN SODIUM 1 MG PO TABS: 1.0000 mg | ORAL_TABLET | Freq: Every day | ORAL | Status: DC

## 2012-01-17 MED ORDER — FENTANYL 50 MCG/HR TD PT72: 1.0000 | MEDICATED_PATCH | TRANSDERMAL | Status: AC

## 2012-01-17 MED ORDER — OXYCODONE HCL 30 MG PO TABS: 30.0000 mg | ORAL_TABLET | ORAL | Status: AC | PRN

## 2012-01-17 MED ORDER — WARFARIN SODIUM 3 MG PO TABS
3.0000 mg | ORAL_TABLET | Freq: Once | ORAL | Status: AC
  Administered 2012-01-17: 3 mg via ORAL
  Filled 2012-01-17: qty 1

## 2012-01-17 NOTE — Progress Notes (Signed)
Physical Therapy Treatment Patient Details Name: Amanda Lewis MRN: [id number] DOB: 10-11-1955 Today's Date: 01/17/2012 Time: 1000-1015 PT Time Calculation (min): 15 min  PT Assessment / Plan / Recommendation Comments on Treatment Session  Pt able to increase ambulation distance today but continues to be mildly unsteady.      Follow Up Recommendations  Skilled nursing facility;Inpatient Rehab    Barriers to Discharge        Equipment Recommendations  Defer to next venue    Recommendations for Other Services Rehab consult  Frequency Min 5X/week   Plan Discharge plan remains appropriate;Frequency remains appropriate    Precautions / Restrictions Precautions Precautions: Fall Restrictions Weight Bearing Restrictions: Yes LLE Weight Bearing: Non weight bearing       Mobility  Bed Mobility Bed Mobility: Not assessed (pt in recliner before/after PT session) Transfers Transfers: Sit to Stand;Stand to Sit Sit to Stand: 4: Min guard;With upper extremity assist;With armrests;From chair/3-in-1 Stand to Sit: 4: Min assist;With armrests;To chair/3-in-1;With upper extremity assist Details for Transfer Assistance: Continues to improve with steadiness.  Guarding for safety & cues to focus on smoothness of transition.  Ambulation/Gait Ambulation/Gait Assistance: 4: Min guard Ambulation Distance (Feet): 40 Feet (15' + 25' with seated rest break) Assistive device: Rolling walker Ambulation/Gait Assistance Details: Cues for increased smoothness of RW advancement & to roll RW vs picking it up to advance it.  Encouragement to increase distance.   Gait Pattern: Step-to pattern Wheelchair Mobility Wheelchair Mobility: No    Exercises     PT Diagnosis:    PT Problem List:   PT Treatment Interventions:     PT Goals Acute Rehab PT Goals PT Goal: Sit to Stand - Progress: Progressing toward goal PT Goal: Ambulate - Progress: Progressing toward goal  Visit Information  Last PT Received On:  01/17/12 Assistance Needed: +2 (+2 for safety to follow with recliner. )    Subjective Data      Cognition  Overall Cognitive Status: Appears within functional limits for tasks assessed/performed Arousal/Alertness: Awake/alert Orientation Level: Appears intact for tasks assessed Behavior During Session: [name] for tasks performed    Balance     End of Session PT - End of Session Equipment Utilized During Treatment: Gait belt Activity Tolerance: Patient tolerated treatment well;Patient limited by fatigue Patient left: in chair;with call bell/phone within reach    Lara Mulch 01/17/2012, 12:22 PM  Verdell Face, PTA [phone] 01/17/2012

## 2012-01-17 NOTE — Progress Notes (Signed)
ANTICOAGULATION CONSULT NOTE - Follow Up Consult  Pharmacy Consult for Coumadin Indication: VTE prophylaxis  Patient Measurements: Height: 5' 5.5" (166.4 cm) Weight: 180 lb (81.647 kg) IBW/kg (Calculated) : 58.15    Vital Signs: Temp: 98.8 F (37.1 C) (05/20 0618) Temp src: Oral (05/20 0618) BP: 128/85 mmHg (05/20 0618) Pulse Rate: 90  (05/20 0618)  Labs:  Amanda Lewis 01/17/12 0825 01/16/12 0535 01/15/12 0550  HGB -- -- --  HCT -- -- --  PLT -- -- --  APTT -- -- --  LABPROT 25.9* 21.9* 22.2*  INR 2.32* 1.88* 1.91*  HEPARINUNFRC -- -- --  CREATININE -- -- --  CKTOTAL -- -- --  CKMB -- -- --  TROPONINI -- -- --    Estimated Creatinine Clearance: 82.8 ml/min (by C-G formula based on Cr of 0.64).  Assessment: 56yof on Coumadin for VTE prophylaxis s/p AKA. INR (2.3) is therapeutic. INR trending up nicely  - No CBC since 5/15 - No significant bleeding reported   Goal of Therapy:  INR 2-3   Plan:  1. Coumadin 3mg  po x 1 today 2. Plan to d/c today  Amanda Lewis 01/17/2012,10:27 AM

## 2012-01-17 NOTE — Discharge Summary (Signed)
Physician Discharge Summary  Patient ID: Amanda Lewis MRN: [id number] DOB/AGE: [birth year] 56 y.o.  Admit date: 01/12/2012 Discharge date: 01/17/2012  Admission Diagnoses: Complex regional pain syndrome left leg  Discharge Diagnoses: Same Principal Problem:  *Complex regional pain syndrome of left lower extremity   Discharged Condition: stable  Hospital Course: Patient's hospital course was essentially unremarkable she underwent a left above-the-knee amputation. Postoperatively her pain was in much better control she was comfortable a significant improvement in her pain control status post amputation. Patient is plan for discharge to short-term skilled nursing do to her inability to be safe at discharge at home. Consults: None  Significant Diagnostic Studies: labs: Routine labs  Treatments: surgery: Please see operative note  Discharge Exam: Blood pressure 128/85, pulse 90, temperature 98.8 F (37.1 C), temperature source Oral, resp. rate 18, height 5' 5.5" (1.664 m), weight 81.647 kg (180 lb), SpO2 96.00%. Incision/Wound: incision clean and dry at time of discharge  Disposition: 01-Home or Self Care   Medication List  As of 01/17/2012  6:35 AM   ASK your doctor about these medications         albuterol 108 (90 BASE) MCG/ACT inhaler   Commonly known as: PROVENTIL HFA;VENTOLIN HFA   Inhale 2 puffs into the lungs every 6 (six) hours as needed. For shortness of breath      cyclobenzaprine 10 MG tablet   Commonly known as: FLEXERIL   Take 10 mg by mouth 3 (three) times daily. scheduled      diazepam 10 MG tablet   Commonly known as: VALIUM   Take 10 mg by mouth every 8 (eight) hours as needed. For anxiety      DULoxetine 60 MG capsule   Commonly known as: CYMBALTA   Take 60 mg by mouth daily.      esomeprazole 40 MG capsule   Commonly known as: NEXIUM   Take 40 mg by mouth daily.      fentaNYL 100 MCG/HR   Commonly known as: DURAGESIC - dosed mcg/hr   Place 1 patch onto  the skin every other day as needed. For pain alternating with patch      fentaNYL 25 MCG/HR   Commonly known as: DURAGESIC - dosed mcg/hr   Place 1 patch onto the skin every other day as needed. For pain. Alternating with patch      furosemide 20 MG tablet   Commonly known as: LASIX   Take 20 mg by mouth daily.      hydrochlorothiazide 25 MG tablet   Commonly known as: HYDRODIURIL   Take 25 mg by mouth daily.      oxycodone 30 MG immediate release tablet   Commonly known as: ROXICODONE   Take 30 mg by mouth every 4 (four) hours as needed. For pain      QUEtiapine 25 MG tablet   Commonly known as: SEROQUEL   Take 25 mg by mouth 2 (two) times daily.      theophylline 300 MG 12 hr tablet   Commonly known as: THEODUR   Take 300 mg by mouth 2 (two) times daily.      Vitamin D3 5000 UNITS Tabs   Take 5,000 Units by mouth every other day.           Follow-up Information    Follow up with Jeanifer Halliday V, MD in 3 weeks.   Contact information:   [address] [place] [address] [phone]  Signed: Garl Speigner V 01/17/2012, 6:35 AM

## 2012-01-17 NOTE — Discharge Instructions (Signed)
Change dressing left above-the-knee amputation when necessary.

## 2012-01-17 NOTE — Progress Notes (Addendum)
Occupational Therapy Treatment Patient Details Name: Amanda Lewis MRN: [id number] DOB: [birth year] Today's Date: 01/17/2012 Time: 1620 - 1649 29 min    OT Assessment / Plan / Recommendation Comments on Treatment Session Focus of session on UB & core strengtheing and dynamic sitting balance. Using therabnd - level II with S for exercise. Pt given written infromation on phantom a dn residual limb sensation/pain and mgnt techniques. Educated tpt on beginning desensitization ex. Also gave pt information on compression wrapping. Pt with good particiaption. PT expressed concerns regarding place she is going to for rehab. Will discuss with SW.    Follow Up Recommendations  Skilled nursing facility    Barriers to Discharge       Equipment Recommendations  Defer to next venue    Recommendations for Other Services    Frequency Min 2X/week   Plan Discharge plan needs to be updated    Precautions / Restrictions Precautions Precautions: Fall Restrictions Weight Bearing Restrictions: Yes LLE Weight Bearing: Non weight bearing   Pertinent Vitals/Pain 5. Had just received pain meds    ADL Discussed importance of completing as much of ADL task as possible independently       OT Goals Acute Rehab OT Goals OT Goal Formulation: With patient Potential to Achieve Goals: Good ADL Goals Pt Will Perform Lower Body Bathing: with supervision;Sit to stand from bed;Sit to stand from chair ADL Goal: Lower Body Bathing - Progress: Progressing toward goals Pt Will Perform Lower Body Dressing: with supervision;Sit to stand from bed;Sit to stand from chair ADL Goal: Lower Body Dressing - Progress: Progressing toward goals Pt Will Transfer to Toilet: with supervision;with DME;3-in-1 ADL Goal: Toilet Transfer - Progress: Progressing toward goals Pt Will Perform Toileting - Clothing Manipulation: with supervision;Sitting on 3-in-1 or toilet;Standing ADL Goal: Toileting - Clothing Manipulation - Progress:  Progressing toward goals Pt Will Perform Toileting - Hygiene: with modified independence;Leaning right and/or left on 3-in-1/toilet ADL Goal: Toileting - Hygiene - Progress: Progressing toward goals  Visit Information  Last OT Received On: 01/17/12 Assistance Needed: +2 (+2 for safety to follow with recliner. )    Subjective Data   I dont' want to go to that place that has offerred me a bed.   Prior Functioning       Cognition  Overall Cognitive Status: Appears within functional limits for tasks assessed/performed Arousal/Alertness: Awake/alert Orientation Level: Appears intact for tasks assessed Behavior During Session: [facility] for tasks performed    Mobility Bed Mobility Bed Mobility: Supine to Sit Rolling Right: 6: Modified independent (Device/Increase time) Supine to Sit: HOB elevated;With rails Transfers Sit to Stand: 4: Min guard;With upper extremity assist;With armrests;From chair/3-in-1 Stand to Sit: 4: Min assist;With armrests;To chair/3-in-1;With upper extremity assist Details for Transfer Assistance: Continues to improve with steadiness.  Guarding for safety & cues to focus on smoothness of transition.    Exercises General Exercises - Upper Extremity Shoulder Flexion: Strengthening;Both;15 reps Shoulder Extension: Strengthening;Both;15 reps Shoulder ABduction: Strengthening;Left;15 reps Elbow Flexion: Strengthening;Both;15 reps Elbow Extension: Both;15 reps;Strengthening  Balance  fair. Close stand by A with basic functional balance  End of Session OT - End of Session Activity Tolerance: Patient tolerated treatment well Patient left: in bed;with call bell/phone within reach Nurse Communication: Other (comment) (discussed pt's concerns regarding feelings toward placement)   Camira Geidel,HILLARY 01/17/2012, 3:28 PM [facility], OTR/L  [phone] 01/17/2012

## 2012-01-17 NOTE — Progress Notes (Signed)
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     Action/Plan:   Patient is for shortterm rehab at [facility].   Anticipated DC Date:  01/17/2012   Anticipated DC Plan:  SKILLED NURSING FACILITY  In-house referral  Clinical Social Worker      DC Planning Services  CM consult      Choice offered to / List presented to:             Status of service:  Completed, signed off  Discharge Disposition:  SKILLED NURSING FACILITY

## 2012-01-17 NOTE — Progress Notes (Signed)
Clinical Social Work  CSW received call back from SNF [facility]) stating that no beds were available. CSW spoke with patient regarding offer at [facility] but patient refused this placement. Patient asked to go to [facility]. CSW spoke with SNF who stated they would assess patient at hospital at 4:15pm and make decision. CSW will follow up after assessment. CSW will continue to follow.   [place], [place] [phone] (Coverage for Lovette Cliche)

## 2012-01-17 NOTE — Progress Notes (Signed)
Clinical Social Work  CSW met with SNF [facility]) representative who stated they accepted patient but patient cannot dc until 5/21. SNF stated patient can dc in AM. CSW informed patient and RN of dc. CSW will continue to follow.  [place], [place] [phone] (Coverage Lovette Cliche)

## 2012-01-18 LAB — PROTIME-INR
INR: 2.21 — ABNORMAL HIGH (ref 0.00–1.49)
Prothrombin Time: 24.9 seconds — ABNORMAL HIGH (ref 11.6–15.2)

## 2012-01-18 MED ORDER — WARFARIN SODIUM 3 MG PO TABS
3.0000 mg | ORAL_TABLET | Freq: Once | ORAL | Status: DC
  Filled 2012-01-18: qty 1

## 2012-01-18 NOTE — Progress Notes (Signed)
ANTICOAGULATION CONSULT NOTE - Follow Up Consult  Pharmacy Consult for Coumadin Indication: VTE prophylaxis  Patient Measurements: Height: 5' 5.5" (166.4 cm) Weight: 180 lb (81.647 kg) IBW/kg (Calculated) : 58.15    Vital Signs: Temp: 98.3 F (36.8 C) (05/21 0543) Temp src: Oral (05/20 2159) BP: 114/73 mmHg (05/21 0543) Pulse Rate: 104  (05/21 0543)  Labs:  Alvira Philips 01/18/12 0520 01/17/12 0825 01/16/12 0535  HGB -- -- --  HCT -- -- --  PLT -- -- --  APTT -- -- --  LABPROT 24.9* 25.9* 21.9*  INR 2.21* 2.32* 1.88*  HEPARINUNFRC -- -- --  CREATININE -- -- --  CKTOTAL -- -- --  CKMB -- -- --  TROPONINI -- -- --    Estimated Creatinine Clearance: 82.8 ml/min (by C-G formula based on Cr of 0.64).  Assessment: 56yof on Coumadin for VTE prophylaxis s/p AKA. INR (2.2) is therapeutic. - No CBC since 5/15 - No significant bleeding reported   Goal of Therapy:  INR 2-3   Plan:  1. Coumadin 3mg  po x 1 today 2. Plan to d/c today  [name] 01/18/2012,8:20 AM

## 2012-01-18 NOTE — Progress Notes (Signed)
PT Cancel Note:   Pt waiting on ambulance to transport to SNF.     Verdell Face, [place] [phone] 01/18/2012

## 2012-01-18 NOTE — Progress Notes (Signed)
Clinical Social Work  CSW spoke with patient and RN who stated patient was ready to dc. DC packet is in [place]. CSW coordinated transportation via [facility]. CSW is signing off.  [place], [place] [phone] (Coverage for Lovette Cliche)

## 2012-01-18 NOTE — Discharge Summary (Signed)
  Patient's discharge was scheduled for yesterday. This was delayed secondary to the accepting skilled nursing facility. Plan for discharge to skilled nursing today. I will followup in the office in 2 weeks.

## 2012-01-18 NOTE — Progress Notes (Signed)
Social work called stating SNF was ready to receive patient.  Patient received Robaxin prior to discharge.  EMS at bedside.  They have patient packet and all patient belongings.  All questions were answered.  Amanda Lewis N

## 2013-11-16 DIAGNOSIS — R5383 Other fatigue: Secondary | ICD-10-CM | POA: Diagnosis not present

## 2013-11-16 DIAGNOSIS — G8929 Other chronic pain: Secondary | ICD-10-CM | POA: Diagnosis not present

## 2013-11-16 DIAGNOSIS — R5381 Other malaise: Secondary | ICD-10-CM | POA: Diagnosis not present

## 2013-11-16 DIAGNOSIS — J441 Chronic obstructive pulmonary disease with (acute) exacerbation: Secondary | ICD-10-CM | POA: Diagnosis not present

## 2013-11-19 DIAGNOSIS — J441 Chronic obstructive pulmonary disease with (acute) exacerbation: Secondary | ICD-10-CM | POA: Diagnosis not present

## 2013-11-19 DIAGNOSIS — G8929 Other chronic pain: Secondary | ICD-10-CM | POA: Diagnosis not present

## 2013-11-19 DIAGNOSIS — R5381 Other malaise: Secondary | ICD-10-CM | POA: Diagnosis not present

## 2013-11-23 DIAGNOSIS — R5381 Other malaise: Secondary | ICD-10-CM | POA: Diagnosis not present

## 2013-11-23 DIAGNOSIS — G8929 Other chronic pain: Secondary | ICD-10-CM | POA: Diagnosis not present

## 2013-11-23 DIAGNOSIS — J441 Chronic obstructive pulmonary disease with (acute) exacerbation: Secondary | ICD-10-CM | POA: Diagnosis not present

## 2013-11-23 DIAGNOSIS — R5383 Other fatigue: Secondary | ICD-10-CM | POA: Diagnosis not present

## 2013-11-26 DIAGNOSIS — M25519 Pain in unspecified shoulder: Secondary | ICD-10-CM | POA: Diagnosis not present

## 2013-11-26 DIAGNOSIS — M19019 Primary osteoarthritis, unspecified shoulder: Secondary | ICD-10-CM | POA: Diagnosis not present

## 2013-11-27 DIAGNOSIS — R5381 Other malaise: Secondary | ICD-10-CM | POA: Diagnosis not present

## 2013-11-27 DIAGNOSIS — J441 Chronic obstructive pulmonary disease with (acute) exacerbation: Secondary | ICD-10-CM | POA: Diagnosis not present

## 2013-11-27 DIAGNOSIS — G8929 Other chronic pain: Secondary | ICD-10-CM | POA: Diagnosis not present

## 2013-11-27 DIAGNOSIS — R5383 Other fatigue: Secondary | ICD-10-CM | POA: Diagnosis not present

## 2013-11-29 DIAGNOSIS — G8929 Other chronic pain: Secondary | ICD-10-CM | POA: Diagnosis not present

## 2013-11-29 DIAGNOSIS — J441 Chronic obstructive pulmonary disease with (acute) exacerbation: Secondary | ICD-10-CM | POA: Diagnosis not present

## 2013-11-29 DIAGNOSIS — R5381 Other malaise: Secondary | ICD-10-CM | POA: Diagnosis not present

## 2013-11-29 DIAGNOSIS — R5383 Other fatigue: Secondary | ICD-10-CM | POA: Diagnosis not present

## 2013-12-03 DIAGNOSIS — J441 Chronic obstructive pulmonary disease with (acute) exacerbation: Secondary | ICD-10-CM | POA: Diagnosis not present

## 2013-12-03 DIAGNOSIS — R5381 Other malaise: Secondary | ICD-10-CM | POA: Diagnosis not present

## 2013-12-03 DIAGNOSIS — G8929 Other chronic pain: Secondary | ICD-10-CM | POA: Diagnosis not present

## 2013-12-07 DIAGNOSIS — R5381 Other malaise: Secondary | ICD-10-CM | POA: Diagnosis not present

## 2013-12-07 DIAGNOSIS — J441 Chronic obstructive pulmonary disease with (acute) exacerbation: Secondary | ICD-10-CM | POA: Diagnosis not present

## 2013-12-07 DIAGNOSIS — G8929 Other chronic pain: Secondary | ICD-10-CM | POA: Diagnosis not present

## 2013-12-11 DIAGNOSIS — G8929 Other chronic pain: Secondary | ICD-10-CM | POA: Diagnosis not present

## 2013-12-11 DIAGNOSIS — J441 Chronic obstructive pulmonary disease with (acute) exacerbation: Secondary | ICD-10-CM | POA: Diagnosis not present

## 2013-12-11 DIAGNOSIS — R5381 Other malaise: Secondary | ICD-10-CM | POA: Diagnosis not present

## 2013-12-19 DIAGNOSIS — G8929 Other chronic pain: Secondary | ICD-10-CM | POA: Diagnosis not present

## 2013-12-19 DIAGNOSIS — R5381 Other malaise: Secondary | ICD-10-CM | POA: Diagnosis not present

## 2013-12-19 DIAGNOSIS — J441 Chronic obstructive pulmonary disease with (acute) exacerbation: Secondary | ICD-10-CM | POA: Diagnosis not present

## 2013-12-19 DIAGNOSIS — R5383 Other fatigue: Secondary | ICD-10-CM | POA: Diagnosis not present

## 2013-12-21 DIAGNOSIS — J441 Chronic obstructive pulmonary disease with (acute) exacerbation: Secondary | ICD-10-CM | POA: Diagnosis not present

## 2013-12-21 DIAGNOSIS — G8929 Other chronic pain: Secondary | ICD-10-CM | POA: Diagnosis not present

## 2013-12-21 DIAGNOSIS — R5381 Other malaise: Secondary | ICD-10-CM | POA: Diagnosis not present

## 2013-12-25 DIAGNOSIS — J441 Chronic obstructive pulmonary disease with (acute) exacerbation: Secondary | ICD-10-CM | POA: Diagnosis not present

## 2013-12-25 DIAGNOSIS — R5383 Other fatigue: Secondary | ICD-10-CM | POA: Diagnosis not present

## 2013-12-25 DIAGNOSIS — G8929 Other chronic pain: Secondary | ICD-10-CM | POA: Diagnosis not present

## 2013-12-25 DIAGNOSIS — R5381 Other malaise: Secondary | ICD-10-CM | POA: Diagnosis not present

## 2013-12-28 DIAGNOSIS — J441 Chronic obstructive pulmonary disease with (acute) exacerbation: Secondary | ICD-10-CM | POA: Diagnosis not present

## 2013-12-28 DIAGNOSIS — R5381 Other malaise: Secondary | ICD-10-CM | POA: Diagnosis not present

## 2013-12-28 DIAGNOSIS — G8929 Other chronic pain: Secondary | ICD-10-CM | POA: Diagnosis not present

## 2013-12-28 DIAGNOSIS — R5383 Other fatigue: Secondary | ICD-10-CM | POA: Diagnosis not present

## 2013-12-31 DIAGNOSIS — G8929 Other chronic pain: Secondary | ICD-10-CM | POA: Diagnosis not present

## 2013-12-31 DIAGNOSIS — R5381 Other malaise: Secondary | ICD-10-CM | POA: Diagnosis not present

## 2013-12-31 DIAGNOSIS — J441 Chronic obstructive pulmonary disease with (acute) exacerbation: Secondary | ICD-10-CM | POA: Diagnosis not present

## 2013-12-31 DIAGNOSIS — R5383 Other fatigue: Secondary | ICD-10-CM | POA: Diagnosis not present

## 2014-01-03 DIAGNOSIS — J441 Chronic obstructive pulmonary disease with (acute) exacerbation: Secondary | ICD-10-CM | POA: Diagnosis not present

## 2014-01-03 DIAGNOSIS — R5381 Other malaise: Secondary | ICD-10-CM | POA: Diagnosis not present

## 2014-01-03 DIAGNOSIS — G8929 Other chronic pain: Secondary | ICD-10-CM | POA: Diagnosis not present

## 2014-01-08 DIAGNOSIS — J441 Chronic obstructive pulmonary disease with (acute) exacerbation: Secondary | ICD-10-CM | POA: Diagnosis not present

## 2014-01-08 DIAGNOSIS — G8929 Other chronic pain: Secondary | ICD-10-CM | POA: Diagnosis not present

## 2014-01-08 DIAGNOSIS — R5383 Other fatigue: Secondary | ICD-10-CM | POA: Diagnosis not present

## 2014-01-08 DIAGNOSIS — R5381 Other malaise: Secondary | ICD-10-CM | POA: Diagnosis not present

## 2014-01-10 DIAGNOSIS — N390 Urinary tract infection, site not specified: Secondary | ICD-10-CM | POA: Diagnosis not present

## 2014-01-10 DIAGNOSIS — R5381 Other malaise: Secondary | ICD-10-CM | POA: Diagnosis not present

## 2014-01-10 DIAGNOSIS — G8929 Other chronic pain: Secondary | ICD-10-CM | POA: Diagnosis not present

## 2014-01-10 DIAGNOSIS — J441 Chronic obstructive pulmonary disease with (acute) exacerbation: Secondary | ICD-10-CM | POA: Diagnosis not present

## 2014-01-10 DIAGNOSIS — R5383 Other fatigue: Secondary | ICD-10-CM | POA: Diagnosis not present

## 2014-01-14 DIAGNOSIS — R5381 Other malaise: Secondary | ICD-10-CM | POA: Diagnosis not present

## 2014-01-14 DIAGNOSIS — R5383 Other fatigue: Secondary | ICD-10-CM | POA: Diagnosis not present

## 2014-01-14 DIAGNOSIS — G8929 Other chronic pain: Secondary | ICD-10-CM | POA: Diagnosis not present

## 2014-01-14 DIAGNOSIS — J441 Chronic obstructive pulmonary disease with (acute) exacerbation: Secondary | ICD-10-CM | POA: Diagnosis not present

## 2014-01-22 DIAGNOSIS — M25569 Pain in unspecified knee: Secondary | ICD-10-CM | POA: Diagnosis not present

## 2014-01-22 DIAGNOSIS — M25469 Effusion, unspecified knee: Secondary | ICD-10-CM | POA: Diagnosis not present

## 2014-01-22 DIAGNOSIS — IMO0002 Reserved for concepts with insufficient information to code with codable children: Secondary | ICD-10-CM | POA: Diagnosis not present

## 2014-02-19 DIAGNOSIS — Z23 Encounter for immunization: Secondary | ICD-10-CM | POA: Diagnosis not present

## 2014-02-19 DIAGNOSIS — S51009A Unspecified open wound of unspecified elbow, initial encounter: Secondary | ICD-10-CM | POA: Diagnosis not present

## 2014-02-19 DIAGNOSIS — S7000XA Contusion of unspecified hip, initial encounter: Secondary | ICD-10-CM | POA: Diagnosis not present

## 2014-03-12 DIAGNOSIS — S0993XA Unspecified injury of face, initial encounter: Secondary | ICD-10-CM | POA: Diagnosis not present

## 2014-03-12 DIAGNOSIS — IMO0002 Reserved for concepts with insufficient information to code with codable children: Secondary | ICD-10-CM | POA: Diagnosis not present

## 2014-03-12 DIAGNOSIS — S4980XA Other specified injuries of shoulder and upper arm, unspecified arm, initial encounter: Secondary | ICD-10-CM | POA: Diagnosis not present

## 2014-03-12 DIAGNOSIS — S46909A Unspecified injury of unspecified muscle, fascia and tendon at shoulder and upper arm level, unspecified arm, initial encounter: Secondary | ICD-10-CM | POA: Diagnosis not present

## 2014-03-12 DIAGNOSIS — S199XXA Unspecified injury of neck, initial encounter: Secondary | ICD-10-CM | POA: Diagnosis not present

## 2014-03-12 DIAGNOSIS — S298XXA Other specified injuries of thorax, initial encounter: Secondary | ICD-10-CM | POA: Diagnosis not present

## 2014-04-26 DIAGNOSIS — R059 Cough, unspecified: Secondary | ICD-10-CM | POA: Diagnosis not present

## 2014-04-26 DIAGNOSIS — R0602 Shortness of breath: Secondary | ICD-10-CM | POA: Diagnosis not present

## 2014-06-03 DIAGNOSIS — Z79899 Other long term (current) drug therapy: Secondary | ICD-10-CM | POA: Diagnosis not present

## 2014-06-03 DIAGNOSIS — K449 Diaphragmatic hernia without obstruction or gangrene: Secondary | ICD-10-CM | POA: Diagnosis present

## 2014-06-03 DIAGNOSIS — G43909 Migraine, unspecified, not intractable, without status migrainosus: Secondary | ICD-10-CM | POA: Diagnosis not present

## 2014-06-03 DIAGNOSIS — G9050 Complex regional pain syndrome I, unspecified: Secondary | ICD-10-CM | POA: Diagnosis present

## 2014-06-03 DIAGNOSIS — J449 Chronic obstructive pulmonary disease, unspecified: Secondary | ICD-10-CM | POA: Diagnosis present

## 2014-06-03 DIAGNOSIS — F447 Conversion disorder with mixed symptom presentation: Secondary | ICD-10-CM | POA: Diagnosis present

## 2014-06-03 DIAGNOSIS — Z9981 Dependence on supplemental oxygen: Secondary | ICD-10-CM | POA: Diagnosis not present

## 2014-06-03 DIAGNOSIS — I509 Heart failure, unspecified: Secondary | ICD-10-CM | POA: Diagnosis present

## 2014-06-03 DIAGNOSIS — B370 Candidal stomatitis: Secondary | ICD-10-CM | POA: Diagnosis present

## 2014-06-03 DIAGNOSIS — G459 Transient cerebral ischemic attack, unspecified: Secondary | ICD-10-CM | POA: Diagnosis not present

## 2014-06-03 DIAGNOSIS — B3781 Candidal esophagitis: Secondary | ICD-10-CM | POA: Diagnosis present

## 2014-06-03 DIAGNOSIS — Z23 Encounter for immunization: Secondary | ICD-10-CM | POA: Diagnosis not present

## 2014-06-03 DIAGNOSIS — G4733 Obstructive sleep apnea (adult) (pediatric): Secondary | ICD-10-CM | POA: Diagnosis present

## 2014-06-03 DIAGNOSIS — Z87891 Personal history of nicotine dependence: Secondary | ICD-10-CM | POA: Diagnosis not present

## 2014-06-03 DIAGNOSIS — I959 Hypotension, unspecified: Secondary | ICD-10-CM | POA: Diagnosis not present

## 2014-06-03 DIAGNOSIS — K219 Gastro-esophageal reflux disease without esophagitis: Secondary | ICD-10-CM | POA: Diagnosis present

## 2014-06-03 DIAGNOSIS — Z89612 Acquired absence of left leg above knee: Secondary | ICD-10-CM | POA: Diagnosis not present

## 2014-06-03 DIAGNOSIS — R5383 Other fatigue: Secondary | ICD-10-CM | POA: Diagnosis present

## 2014-06-03 DIAGNOSIS — J9610 Chronic respiratory failure, unspecified whether with hypoxia or hypercapnia: Secondary | ICD-10-CM | POA: Diagnosis present

## 2014-06-03 DIAGNOSIS — R1310 Dysphagia, unspecified: Secondary | ICD-10-CM | POA: Diagnosis present

## 2014-06-03 DIAGNOSIS — E876 Hypokalemia: Secondary | ICD-10-CM | POA: Diagnosis present

## 2014-06-10 DIAGNOSIS — R5383 Other fatigue: Secondary | ICD-10-CM | POA: Diagnosis not present

## 2014-06-10 DIAGNOSIS — F449 Dissociative and conversion disorder, unspecified: Secondary | ICD-10-CM | POA: Diagnosis not present

## 2014-06-10 DIAGNOSIS — J441 Chronic obstructive pulmonary disease with (acute) exacerbation: Secondary | ICD-10-CM | POA: Diagnosis not present

## 2014-06-10 DIAGNOSIS — I959 Hypotension, unspecified: Secondary | ICD-10-CM | POA: Diagnosis not present

## 2014-06-11 DIAGNOSIS — F449 Dissociative and conversion disorder, unspecified: Secondary | ICD-10-CM | POA: Diagnosis not present

## 2014-06-11 DIAGNOSIS — R5383 Other fatigue: Secondary | ICD-10-CM | POA: Diagnosis not present

## 2014-06-11 DIAGNOSIS — I959 Hypotension, unspecified: Secondary | ICD-10-CM | POA: Diagnosis not present

## 2014-06-11 DIAGNOSIS — J441 Chronic obstructive pulmonary disease with (acute) exacerbation: Secondary | ICD-10-CM | POA: Diagnosis not present

## 2014-06-12 DIAGNOSIS — R5383 Other fatigue: Secondary | ICD-10-CM | POA: Diagnosis not present

## 2014-06-12 DIAGNOSIS — J441 Chronic obstructive pulmonary disease with (acute) exacerbation: Secondary | ICD-10-CM | POA: Diagnosis not present

## 2014-06-12 DIAGNOSIS — I959 Hypotension, unspecified: Secondary | ICD-10-CM | POA: Diagnosis not present

## 2014-06-12 DIAGNOSIS — F449 Dissociative and conversion disorder, unspecified: Secondary | ICD-10-CM | POA: Diagnosis not present

## 2014-06-13 DIAGNOSIS — F449 Dissociative and conversion disorder, unspecified: Secondary | ICD-10-CM | POA: Diagnosis not present

## 2014-06-13 DIAGNOSIS — J441 Chronic obstructive pulmonary disease with (acute) exacerbation: Secondary | ICD-10-CM | POA: Diagnosis not present

## 2014-06-13 DIAGNOSIS — R5383 Other fatigue: Secondary | ICD-10-CM | POA: Diagnosis not present

## 2014-06-13 DIAGNOSIS — I959 Hypotension, unspecified: Secondary | ICD-10-CM | POA: Diagnosis not present

## 2014-06-14 DIAGNOSIS — I959 Hypotension, unspecified: Secondary | ICD-10-CM | POA: Diagnosis not present

## 2014-06-14 DIAGNOSIS — F449 Dissociative and conversion disorder, unspecified: Secondary | ICD-10-CM | POA: Diagnosis not present

## 2014-06-14 DIAGNOSIS — J441 Chronic obstructive pulmonary disease with (acute) exacerbation: Secondary | ICD-10-CM | POA: Diagnosis not present

## 2014-06-14 DIAGNOSIS — R5383 Other fatigue: Secondary | ICD-10-CM | POA: Diagnosis not present

## 2014-06-18 DIAGNOSIS — I959 Hypotension, unspecified: Secondary | ICD-10-CM | POA: Diagnosis not present

## 2014-06-18 DIAGNOSIS — R5383 Other fatigue: Secondary | ICD-10-CM | POA: Diagnosis not present

## 2014-06-18 DIAGNOSIS — J441 Chronic obstructive pulmonary disease with (acute) exacerbation: Secondary | ICD-10-CM | POA: Diagnosis not present

## 2014-06-18 DIAGNOSIS — F449 Dissociative and conversion disorder, unspecified: Secondary | ICD-10-CM | POA: Diagnosis not present

## 2014-06-19 DIAGNOSIS — J441 Chronic obstructive pulmonary disease with (acute) exacerbation: Secondary | ICD-10-CM | POA: Diagnosis not present

## 2014-06-19 DIAGNOSIS — I959 Hypotension, unspecified: Secondary | ICD-10-CM | POA: Diagnosis not present

## 2014-06-19 DIAGNOSIS — F449 Dissociative and conversion disorder, unspecified: Secondary | ICD-10-CM | POA: Diagnosis not present

## 2014-06-19 DIAGNOSIS — R5383 Other fatigue: Secondary | ICD-10-CM | POA: Diagnosis not present

## 2014-06-20 DIAGNOSIS — R5383 Other fatigue: Secondary | ICD-10-CM | POA: Diagnosis not present

## 2014-06-20 DIAGNOSIS — I959 Hypotension, unspecified: Secondary | ICD-10-CM | POA: Diagnosis not present

## 2014-06-20 DIAGNOSIS — F449 Dissociative and conversion disorder, unspecified: Secondary | ICD-10-CM | POA: Diagnosis not present

## 2014-06-20 DIAGNOSIS — J441 Chronic obstructive pulmonary disease with (acute) exacerbation: Secondary | ICD-10-CM | POA: Diagnosis not present

## 2014-06-24 DIAGNOSIS — I959 Hypotension, unspecified: Secondary | ICD-10-CM | POA: Diagnosis not present

## 2014-06-24 DIAGNOSIS — F449 Dissociative and conversion disorder, unspecified: Secondary | ICD-10-CM | POA: Diagnosis not present

## 2014-06-24 DIAGNOSIS — J441 Chronic obstructive pulmonary disease with (acute) exacerbation: Secondary | ICD-10-CM | POA: Diagnosis not present

## 2014-06-24 DIAGNOSIS — R5383 Other fatigue: Secondary | ICD-10-CM | POA: Diagnosis not present

## 2014-06-27 DIAGNOSIS — F449 Dissociative and conversion disorder, unspecified: Secondary | ICD-10-CM | POA: Diagnosis not present

## 2014-06-27 DIAGNOSIS — R5383 Other fatigue: Secondary | ICD-10-CM | POA: Diagnosis not present

## 2014-06-27 DIAGNOSIS — I959 Hypotension, unspecified: Secondary | ICD-10-CM | POA: Diagnosis not present

## 2014-06-27 DIAGNOSIS — J441 Chronic obstructive pulmonary disease with (acute) exacerbation: Secondary | ICD-10-CM | POA: Diagnosis not present

## 2014-07-04 DIAGNOSIS — F449 Dissociative and conversion disorder, unspecified: Secondary | ICD-10-CM | POA: Diagnosis not present

## 2014-07-04 DIAGNOSIS — I959 Hypotension, unspecified: Secondary | ICD-10-CM | POA: Diagnosis not present

## 2014-07-04 DIAGNOSIS — J441 Chronic obstructive pulmonary disease with (acute) exacerbation: Secondary | ICD-10-CM | POA: Diagnosis not present

## 2014-07-04 DIAGNOSIS — R5383 Other fatigue: Secondary | ICD-10-CM | POA: Diagnosis not present

## 2014-07-09 DIAGNOSIS — J441 Chronic obstructive pulmonary disease with (acute) exacerbation: Secondary | ICD-10-CM | POA: Diagnosis not present

## 2014-07-09 DIAGNOSIS — I959 Hypotension, unspecified: Secondary | ICD-10-CM | POA: Diagnosis not present

## 2014-07-09 DIAGNOSIS — F449 Dissociative and conversion disorder, unspecified: Secondary | ICD-10-CM | POA: Diagnosis not present

## 2014-07-09 DIAGNOSIS — R5383 Other fatigue: Secondary | ICD-10-CM | POA: Diagnosis not present

## 2014-07-16 DIAGNOSIS — J441 Chronic obstructive pulmonary disease with (acute) exacerbation: Secondary | ICD-10-CM | POA: Diagnosis not present

## 2014-07-16 DIAGNOSIS — I959 Hypotension, unspecified: Secondary | ICD-10-CM | POA: Diagnosis not present

## 2014-07-16 DIAGNOSIS — R5383 Other fatigue: Secondary | ICD-10-CM | POA: Diagnosis not present

## 2014-07-16 DIAGNOSIS — F449 Dissociative and conversion disorder, unspecified: Secondary | ICD-10-CM | POA: Diagnosis not present

## 2014-07-22 DIAGNOSIS — R5383 Other fatigue: Secondary | ICD-10-CM | POA: Diagnosis not present

## 2014-07-22 DIAGNOSIS — F449 Dissociative and conversion disorder, unspecified: Secondary | ICD-10-CM | POA: Diagnosis not present

## 2014-07-22 DIAGNOSIS — I959 Hypotension, unspecified: Secondary | ICD-10-CM | POA: Diagnosis not present

## 2014-07-22 DIAGNOSIS — J441 Chronic obstructive pulmonary disease with (acute) exacerbation: Secondary | ICD-10-CM | POA: Diagnosis not present

## 2014-08-01 DIAGNOSIS — Z1231 Encounter for screening mammogram for malignant neoplasm of breast: Secondary | ICD-10-CM | POA: Diagnosis not present

## 2014-08-14 DIAGNOSIS — N390 Urinary tract infection, site not specified: Secondary | ICD-10-CM | POA: Diagnosis not present

## 2014-09-11 DIAGNOSIS — Z87891 Personal history of nicotine dependence: Secondary | ICD-10-CM | POA: Diagnosis not present

## 2014-09-11 DIAGNOSIS — E780 Pure hypercholesterolemia: Secondary | ICD-10-CM | POA: Diagnosis present

## 2014-09-11 DIAGNOSIS — R6510 Systemic inflammatory response syndrome (SIRS) of non-infectious origin without acute organ dysfunction: Secondary | ICD-10-CM | POA: Diagnosis not present

## 2014-09-11 DIAGNOSIS — Z882 Allergy status to sulfonamides status: Secondary | ICD-10-CM | POA: Diagnosis not present

## 2014-09-11 DIAGNOSIS — J441 Chronic obstructive pulmonary disease with (acute) exacerbation: Secondary | ICD-10-CM | POA: Diagnosis not present

## 2014-09-11 DIAGNOSIS — Z9089 Acquired absence of other organs: Secondary | ICD-10-CM | POA: Diagnosis present

## 2014-09-11 DIAGNOSIS — Z9889 Other specified postprocedural states: Secondary | ICD-10-CM | POA: Diagnosis not present

## 2014-09-11 DIAGNOSIS — K219 Gastro-esophageal reflux disease without esophagitis: Secondary | ICD-10-CM | POA: Diagnosis present

## 2014-09-11 DIAGNOSIS — Z888 Allergy status to other drugs, medicaments and biological substances status: Secondary | ICD-10-CM | POA: Diagnosis not present

## 2014-09-11 DIAGNOSIS — R262 Difficulty in walking, not elsewhere classified: Secondary | ICD-10-CM | POA: Diagnosis present

## 2014-09-11 DIAGNOSIS — Z886 Allergy status to analgesic agent status: Secondary | ICD-10-CM | POA: Diagnosis not present

## 2014-09-11 DIAGNOSIS — I509 Heart failure, unspecified: Secondary | ICD-10-CM | POA: Diagnosis not present

## 2014-09-11 DIAGNOSIS — I10 Essential (primary) hypertension: Secondary | ICD-10-CM | POA: Diagnosis present

## 2014-09-11 DIAGNOSIS — D72829 Elevated white blood cell count, unspecified: Secondary | ICD-10-CM | POA: Diagnosis present

## 2014-09-11 DIAGNOSIS — J45909 Unspecified asthma, uncomplicated: Secondary | ICD-10-CM | POA: Diagnosis present

## 2014-09-11 DIAGNOSIS — Z90710 Acquired absence of both cervix and uterus: Secondary | ICD-10-CM | POA: Diagnosis not present

## 2014-09-11 DIAGNOSIS — Z89612 Acquired absence of left leg above knee: Secondary | ICD-10-CM | POA: Diagnosis not present

## 2014-09-11 DIAGNOSIS — J80 Acute respiratory distress syndrome: Secondary | ICD-10-CM | POA: Diagnosis not present

## 2014-09-11 DIAGNOSIS — R0602 Shortness of breath: Secondary | ICD-10-CM | POA: Diagnosis not present

## 2014-09-17 DIAGNOSIS — R262 Difficulty in walking, not elsewhere classified: Secondary | ICD-10-CM | POA: Diagnosis not present

## 2014-09-17 DIAGNOSIS — J441 Chronic obstructive pulmonary disease with (acute) exacerbation: Secondary | ICD-10-CM | POA: Diagnosis not present

## 2014-09-17 DIAGNOSIS — Z9981 Dependence on supplemental oxygen: Secondary | ICD-10-CM | POA: Diagnosis not present

## 2014-09-17 DIAGNOSIS — Z89612 Acquired absence of left leg above knee: Secondary | ICD-10-CM | POA: Diagnosis not present

## 2014-09-19 DIAGNOSIS — Z89612 Acquired absence of left leg above knee: Secondary | ICD-10-CM | POA: Diagnosis not present

## 2014-09-19 DIAGNOSIS — Z9981 Dependence on supplemental oxygen: Secondary | ICD-10-CM | POA: Diagnosis not present

## 2014-09-19 DIAGNOSIS — J441 Chronic obstructive pulmonary disease with (acute) exacerbation: Secondary | ICD-10-CM | POA: Diagnosis not present

## 2014-09-19 DIAGNOSIS — R262 Difficulty in walking, not elsewhere classified: Secondary | ICD-10-CM | POA: Diagnosis not present

## 2014-09-21 DIAGNOSIS — Z9981 Dependence on supplemental oxygen: Secondary | ICD-10-CM | POA: Diagnosis not present

## 2014-09-21 DIAGNOSIS — Z89612 Acquired absence of left leg above knee: Secondary | ICD-10-CM | POA: Diagnosis not present

## 2014-09-21 DIAGNOSIS — R262 Difficulty in walking, not elsewhere classified: Secondary | ICD-10-CM | POA: Diagnosis not present

## 2014-09-21 DIAGNOSIS — J441 Chronic obstructive pulmonary disease with (acute) exacerbation: Secondary | ICD-10-CM | POA: Diagnosis not present

## 2014-09-23 DIAGNOSIS — J441 Chronic obstructive pulmonary disease with (acute) exacerbation: Secondary | ICD-10-CM | POA: Diagnosis not present

## 2014-09-23 DIAGNOSIS — R262 Difficulty in walking, not elsewhere classified: Secondary | ICD-10-CM | POA: Diagnosis not present

## 2014-09-23 DIAGNOSIS — Z89612 Acquired absence of left leg above knee: Secondary | ICD-10-CM | POA: Diagnosis not present

## 2014-09-23 DIAGNOSIS — Z9981 Dependence on supplemental oxygen: Secondary | ICD-10-CM | POA: Diagnosis not present

## 2014-09-26 DIAGNOSIS — Z89612 Acquired absence of left leg above knee: Secondary | ICD-10-CM | POA: Diagnosis not present

## 2014-09-26 DIAGNOSIS — Z9981 Dependence on supplemental oxygen: Secondary | ICD-10-CM | POA: Diagnosis not present

## 2014-09-26 DIAGNOSIS — J441 Chronic obstructive pulmonary disease with (acute) exacerbation: Secondary | ICD-10-CM | POA: Diagnosis not present

## 2014-09-26 DIAGNOSIS — R262 Difficulty in walking, not elsewhere classified: Secondary | ICD-10-CM | POA: Diagnosis not present

## 2014-09-30 DIAGNOSIS — R262 Difficulty in walking, not elsewhere classified: Secondary | ICD-10-CM | POA: Diagnosis not present

## 2014-09-30 DIAGNOSIS — Z9981 Dependence on supplemental oxygen: Secondary | ICD-10-CM | POA: Diagnosis not present

## 2014-09-30 DIAGNOSIS — Z89612 Acquired absence of left leg above knee: Secondary | ICD-10-CM | POA: Diagnosis not present

## 2014-09-30 DIAGNOSIS — J441 Chronic obstructive pulmonary disease with (acute) exacerbation: Secondary | ICD-10-CM | POA: Diagnosis not present

## 2014-10-03 DIAGNOSIS — Z9981 Dependence on supplemental oxygen: Secondary | ICD-10-CM | POA: Diagnosis not present

## 2014-10-03 DIAGNOSIS — Z89612 Acquired absence of left leg above knee: Secondary | ICD-10-CM | POA: Diagnosis not present

## 2014-10-03 DIAGNOSIS — R262 Difficulty in walking, not elsewhere classified: Secondary | ICD-10-CM | POA: Diagnosis not present

## 2014-10-03 DIAGNOSIS — J441 Chronic obstructive pulmonary disease with (acute) exacerbation: Secondary | ICD-10-CM | POA: Diagnosis not present

## 2014-10-08 DIAGNOSIS — R262 Difficulty in walking, not elsewhere classified: Secondary | ICD-10-CM | POA: Diagnosis not present

## 2014-10-08 DIAGNOSIS — Z9981 Dependence on supplemental oxygen: Secondary | ICD-10-CM | POA: Diagnosis not present

## 2014-10-08 DIAGNOSIS — Z89612 Acquired absence of left leg above knee: Secondary | ICD-10-CM | POA: Diagnosis not present

## 2014-10-08 DIAGNOSIS — J441 Chronic obstructive pulmonary disease with (acute) exacerbation: Secondary | ICD-10-CM | POA: Diagnosis not present

## 2014-10-14 DIAGNOSIS — Z9981 Dependence on supplemental oxygen: Secondary | ICD-10-CM | POA: Diagnosis not present

## 2014-10-14 DIAGNOSIS — J441 Chronic obstructive pulmonary disease with (acute) exacerbation: Secondary | ICD-10-CM | POA: Diagnosis not present

## 2014-10-14 DIAGNOSIS — Z89612 Acquired absence of left leg above knee: Secondary | ICD-10-CM | POA: Diagnosis not present

## 2014-10-14 DIAGNOSIS — R262 Difficulty in walking, not elsewhere classified: Secondary | ICD-10-CM | POA: Diagnosis not present

## 2015-02-20 DIAGNOSIS — M549 Dorsalgia, unspecified: Secondary | ICD-10-CM | POA: Diagnosis not present

## 2015-02-20 DIAGNOSIS — Z89612 Acquired absence of left leg above knee: Secondary | ICD-10-CM | POA: Diagnosis not present

## 2015-02-20 DIAGNOSIS — Z6826 Body mass index (BMI) 26.0-26.9, adult: Secondary | ICD-10-CM | POA: Diagnosis not present

## 2015-02-20 DIAGNOSIS — M79605 Pain in left leg: Secondary | ICD-10-CM | POA: Diagnosis not present

## 2015-02-20 DIAGNOSIS — Z79899 Other long term (current) drug therapy: Secondary | ICD-10-CM | POA: Diagnosis not present

## 2015-05-08 DIAGNOSIS — G546 Phantom limb syndrome with pain: Secondary | ICD-10-CM | POA: Diagnosis not present

## 2015-05-08 DIAGNOSIS — Z6826 Body mass index (BMI) 26.0-26.9, adult: Secondary | ICD-10-CM | POA: Diagnosis not present

## 2015-05-08 DIAGNOSIS — Z89612 Acquired absence of left leg above knee: Secondary | ICD-10-CM | POA: Diagnosis not present

## 2015-05-08 DIAGNOSIS — Z79899 Other long term (current) drug therapy: Secondary | ICD-10-CM | POA: Diagnosis not present

## 2015-06-19 DIAGNOSIS — Z23 Encounter for immunization: Secondary | ICD-10-CM | POA: Diagnosis not present

## 2015-07-02 DIAGNOSIS — Z6826 Body mass index (BMI) 26.0-26.9, adult: Secondary | ICD-10-CM | POA: Diagnosis not present

## 2015-07-02 DIAGNOSIS — G629 Polyneuropathy, unspecified: Secondary | ICD-10-CM | POA: Diagnosis not present

## 2015-07-02 DIAGNOSIS — F329 Major depressive disorder, single episode, unspecified: Secondary | ICD-10-CM | POA: Diagnosis not present

## 2015-07-02 DIAGNOSIS — R12 Heartburn: Secondary | ICD-10-CM | POA: Diagnosis not present

## 2015-07-02 DIAGNOSIS — E785 Hyperlipidemia, unspecified: Secondary | ICD-10-CM | POA: Diagnosis not present

## 2015-07-02 DIAGNOSIS — J449 Chronic obstructive pulmonary disease, unspecified: Secondary | ICD-10-CM | POA: Diagnosis not present

## 2015-07-02 DIAGNOSIS — R789 Finding of unspecified substance, not normally found in blood: Secondary | ICD-10-CM | POA: Diagnosis not present

## 2015-07-02 DIAGNOSIS — I10 Essential (primary) hypertension: Secondary | ICD-10-CM | POA: Diagnosis not present

## 2015-07-02 DIAGNOSIS — M2550 Pain in unspecified joint: Secondary | ICD-10-CM | POA: Diagnosis not present

## 2015-07-02 DIAGNOSIS — Z79899 Other long term (current) drug therapy: Secondary | ICD-10-CM | POA: Diagnosis not present

## 2015-07-02 DIAGNOSIS — J309 Allergic rhinitis, unspecified: Secondary | ICD-10-CM | POA: Diagnosis not present

## 2015-07-11 ENCOUNTER — Other Ambulatory Visit (HOSPITAL_COMMUNITY): Payer: Self-pay | Admitting: Orthopedic Surgery

## 2015-07-18 NOTE — Progress Notes (Signed)
Several unsuccessful attempts have been made to contact pt; lvm with pre-op instructions at phone number listed. Please complete assessment on DOS.

## 2015-07-18 NOTE — Progress Notes (Signed)
Error

## 2015-07-19 ENCOUNTER — Encounter (HOSPITAL_COMMUNITY): Payer: Self-pay | Admitting: *Deleted

## 2015-07-19 ENCOUNTER — Encounter (HOSPITAL_COMMUNITY)
Admission: RE | Disposition: A | Discharge status: Home / Self Care | Payer: Self-pay | Source: Ambulatory Visit | Attending: Orthopedic Surgery

## 2015-07-19 ENCOUNTER — Ambulatory Visit (HOSPITAL_COMMUNITY)
Admission: RE | Admit: 2015-07-19 | Discharge: 2015-07-21 | Disposition: A | Discharge status: Home / Self Care | Payer: Medicare Other | Source: Ambulatory Visit | Attending: Orthopedic Surgery | Admitting: Orthopedic Surgery

## 2015-07-19 ENCOUNTER — Ambulatory Visit (HOSPITAL_COMMUNITY): Payer: Medicare Other | Admitting: Emergency Medicine

## 2015-07-19 DIAGNOSIS — Z886 Allergy status to analgesic agent status: Secondary | ICD-10-CM | POA: Diagnosis not present

## 2015-07-19 DIAGNOSIS — M545 Low back pain: Secondary | ICD-10-CM | POA: Diagnosis not present

## 2015-07-19 DIAGNOSIS — Z87442 Personal history of urinary calculi: Secondary | ICD-10-CM | POA: Insufficient documentation

## 2015-07-19 DIAGNOSIS — T8789 Other complications of amputation stump: Secondary | ICD-10-CM | POA: Diagnosis not present

## 2015-07-19 DIAGNOSIS — M1990 Unspecified osteoarthritis, unspecified site: Secondary | ICD-10-CM | POA: Diagnosis not present

## 2015-07-19 DIAGNOSIS — E042 Nontoxic multinodular goiter: Secondary | ICD-10-CM | POA: Diagnosis not present

## 2015-07-19 DIAGNOSIS — Z7901 Long term (current) use of anticoagulants: Secondary | ICD-10-CM | POA: Insufficient documentation

## 2015-07-19 DIAGNOSIS — F329 Major depressive disorder, single episode, unspecified: Secondary | ICD-10-CM | POA: Diagnosis not present

## 2015-07-19 DIAGNOSIS — Z881 Allergy status to other antibiotic agents status: Secondary | ICD-10-CM | POA: Insufficient documentation

## 2015-07-19 DIAGNOSIS — Z8673 Personal history of transient ischemic attack (TIA), and cerebral infarction without residual deficits: Secondary | ICD-10-CM | POA: Diagnosis not present

## 2015-07-19 DIAGNOSIS — J449 Chronic obstructive pulmonary disease, unspecified: Secondary | ICD-10-CM | POA: Diagnosis not present

## 2015-07-19 DIAGNOSIS — G4730 Sleep apnea, unspecified: Secondary | ICD-10-CM | POA: Diagnosis not present

## 2015-07-19 DIAGNOSIS — Y835 Amputation of limb(s) as the cause of abnormal reaction of the patient, or of later complication, without mention of misadventure at the time of the procedure: Secondary | ICD-10-CM | POA: Diagnosis not present

## 2015-07-19 DIAGNOSIS — F419 Anxiety disorder, unspecified: Secondary | ICD-10-CM | POA: Insufficient documentation

## 2015-07-19 DIAGNOSIS — R300 Dysuria: Secondary | ICD-10-CM | POA: Diagnosis not present

## 2015-07-19 DIAGNOSIS — Z87891 Personal history of nicotine dependence: Secondary | ICD-10-CM | POA: Insufficient documentation

## 2015-07-19 DIAGNOSIS — Z89619 Acquired absence of unspecified leg above knee: Secondary | ICD-10-CM

## 2015-07-19 DIAGNOSIS — Z888 Allergy status to other drugs, medicaments and biological substances status: Secondary | ICD-10-CM | POA: Insufficient documentation

## 2015-07-19 DIAGNOSIS — Z89612 Acquired absence of left leg above knee: Secondary | ICD-10-CM | POA: Diagnosis not present

## 2015-07-19 DIAGNOSIS — Z885 Allergy status to narcotic agent status: Secondary | ICD-10-CM | POA: Diagnosis not present

## 2015-07-19 DIAGNOSIS — K219 Gastro-esophageal reflux disease without esophagitis: Secondary | ICD-10-CM | POA: Diagnosis not present

## 2015-07-19 DIAGNOSIS — T8781 Dehiscence of amputation stump: Secondary | ICD-10-CM | POA: Insufficient documentation

## 2015-07-19 DIAGNOSIS — G90522 Complex regional pain syndrome I of left lower limb: Secondary | ICD-10-CM | POA: Diagnosis not present

## 2015-07-19 DIAGNOSIS — Z9981 Dependence on supplemental oxygen: Secondary | ICD-10-CM | POA: Insufficient documentation

## 2015-07-19 DIAGNOSIS — D649 Anemia, unspecified: Secondary | ICD-10-CM | POA: Diagnosis not present

## 2015-07-19 HISTORY — DX: Nontoxic multinodular goiter: E04.2

## 2015-07-19 HISTORY — PX: STUMP REVISION: SHX6102

## 2015-07-19 HISTORY — DX: Personal history of urinary calculi: Z87.442

## 2015-07-19 HISTORY — DX: Acquired absence of unspecified leg above knee: Z89.619

## 2015-07-19 LAB — COMPREHENSIVE METABOLIC PANEL
ALT: 57 U/L — ABNORMAL HIGH (ref 14–54)
ANION GAP: 7 (ref 5–15)
AST: 34 U/L (ref 15–41)
Albumin: 3.3 g/dL — ABNORMAL LOW (ref 3.5–5.0)
Alkaline Phosphatase: 104 U/L (ref 38–126)
BUN: 13 mg/dL (ref 6–20)
CALCIUM: 9.6 mg/dL (ref 8.9–10.3)
CO2: 31 mmol/L (ref 22–32)
Chloride: 102 mmol/L (ref 101–111)
Creatinine, Ser: 0.56 mg/dL (ref 0.44–1.00)
GFR calc Af Amer: >60 mL/min (ref >60)
GFR calc non Af Amer: >60 mL/min (ref >60)
GLUCOSE: 114 mg/dL — AB (ref 65–99)
POTASSIUM: 3.8 mmol/L (ref 3.5–5.1)
Sodium: 140 mmol/L (ref 135–145)
TOTAL PROTEIN: 6.9 g/dL (ref 6.5–8.1)
Total Bilirubin: 0.4 mg/dL (ref 0.3–1.2)

## 2015-07-19 LAB — MRSA PCR SCREENING: MRSA BY PCR: NEGATIVE

## 2015-07-19 LAB — CBC
HCT: 40.4 % (ref 36.0–46.0)
Hemoglobin: 12.5 g/dL (ref 12.0–15.0)
MCH: 29.2 pg (ref 26.0–34.0)
MCHC: 30.9 g/dL (ref 30.0–36.0)
MCV: 94.4 fL (ref 78.0–100.0)
PLATELETS: 243 10*3/uL (ref 150–400)
RBC: 4.28 MIL/uL (ref 3.87–5.11)
RDW: 13.8 % (ref 11.5–15.5)
WBC: 9.9 10*3/uL (ref 4.0–10.5)

## 2015-07-19 LAB — PROTIME-INR
INR: 1 (ref 0.00–1.49)
Prothrombin Time: 13.4 seconds (ref 11.6–15.2)

## 2015-07-19 LAB — GLUCOSE, CAPILLARY: Glucose-Capillary: 148 mg/dL — ABNORMAL HIGH (ref 65–99)

## 2015-07-19 LAB — APTT: aPTT: 33 seconds (ref 24–37)

## 2015-07-19 SURGERY — REVISION, AMPUTATION SITE
Anesthesia: General | Site: Leg Upper | Laterality: Left

## 2015-07-19 MED ORDER — OXYCODONE-ACETAMINOPHEN 5-325 MG PO TABS
1.0000 | ORAL_TABLET | ORAL | Status: AC | PRN
  Administered 2015-07-19: 1 via ORAL

## 2015-07-19 MED ORDER — HYDROMORPHONE HCL 1 MG/ML IJ SOLN
1.0000 mg | INTRAMUSCULAR | Status: DC | PRN
  Administered 2015-07-20 – 2015-07-21 (×6): 1 mg via INTRAVENOUS
  Filled 2015-07-19 (×6): qty 1

## 2015-07-19 MED ORDER — MIDAZOLAM HCL 2 MG/2ML IJ SOLN
INTRAMUSCULAR | Status: DC | PRN
Start: 2015-07-19 — End: 2015-07-19
  Administered 2015-07-19: 2 mg via INTRAVENOUS

## 2015-07-19 MED ORDER — ACETAMINOPHEN 325 MG PO TABS
650.0000 mg | ORAL_TABLET | Freq: Four times a day (QID) | ORAL | Status: DC | PRN
  Administered 2015-07-21: 650 mg via ORAL
  Filled 2015-07-19: qty 2

## 2015-07-19 MED ORDER — LACTATED RINGERS IV SOLN
INTRAVENOUS | Status: DC | PRN
  Administered 2015-07-19: 08:00:00 via INTRAVENOUS

## 2015-07-19 MED ORDER — MIDAZOLAM HCL 2 MG/2ML IJ SOLN
INTRAMUSCULAR | Status: AC
  Filled 2015-07-19: qty 2

## 2015-07-19 MED ORDER — PHENYLEPHRINE HCL 10 MG/ML IJ SOLN
INTRAMUSCULAR | Status: DC | PRN
  Administered 2015-07-19: 80 ug via INTRAVENOUS

## 2015-07-19 MED ORDER — QUETIAPINE FUMARATE 25 MG PO TABS
25.0000 mg | ORAL_TABLET | Freq: Two times a day (BID) | ORAL | Status: DC
Start: 2015-07-19 — End: 2015-07-21
  Administered 2015-07-19 – 2015-07-21 (×4): 25 mg via ORAL
  Filled 2015-07-19 (×5): qty 1

## 2015-07-19 MED ORDER — LIDOCAINE HCL (CARDIAC) 20 MG/ML IV SOLN
INTRAVENOUS | Status: AC
  Filled 2015-07-19: qty 5

## 2015-07-19 MED ORDER — FENTANYL CITRATE (PF) 250 MCG/5ML IJ SOLN
INTRAMUSCULAR | Status: AC
  Filled 2015-07-19: qty 5

## 2015-07-19 MED ORDER — METOCLOPRAMIDE HCL 5 MG PO TABS: 5.0000 mg | ORAL_TABLET | Freq: Three times a day (TID) | ORAL | Status: DC | PRN

## 2015-07-19 MED ORDER — ACETAMINOPHEN 650 MG RE SUPP: 650.0000 mg | Freq: Four times a day (QID) | RECTAL | Status: DC | PRN

## 2015-07-19 MED ORDER — THEOPHYLLINE ER 300 MG PO TB12
300.0000 mg | ORAL_TABLET | Freq: Two times a day (BID) | ORAL | Status: DC
  Administered 2015-07-19 – 2015-07-21 (×4): 300 mg via ORAL
  Filled 2015-07-19 (×5): qty 1

## 2015-07-19 MED ORDER — HYDROMORPHONE HCL 1 MG/ML IJ SOLN
INTRAMUSCULAR | Status: AC
Start: 2015-07-19 — End: 2015-07-20
  Filled 2015-07-19: qty 1

## 2015-07-19 MED ORDER — DULOXETINE HCL 60 MG PO CPEP
60.0000 mg | ORAL_CAPSULE | Freq: Every day | ORAL | Status: DC
  Administered 2015-07-20 – 2015-07-21 (×2): 60 mg via ORAL
  Filled 2015-07-19 (×3): qty 1

## 2015-07-19 MED ORDER — HYDROMORPHONE HCL 1 MG/ML IJ SOLN
INTRAMUSCULAR | Status: AC
  Administered 2015-07-19: 0.5 mg via INTRAVENOUS
  Filled 2015-07-19: qty 1

## 2015-07-19 MED ORDER — DEXTROSE 5 % IV SOLN: 500.0000 mg | Freq: Four times a day (QID) | INTRAVENOUS | Status: DC | PRN

## 2015-07-19 MED ORDER — CHLORHEXIDINE GLUCONATE 4 % EX LIQD: 60.0000 mL | Freq: Once | CUTANEOUS | Status: DC

## 2015-07-19 MED ORDER — CEFAZOLIN SODIUM-DEXTROSE 2-3 GM-% IV SOLR
2.0000 g | INTRAVENOUS | Status: AC
  Administered 2015-07-19: 2 g via INTRAVENOUS
  Filled 2015-07-19: qty 50

## 2015-07-19 MED ORDER — PANTOPRAZOLE SODIUM 40 MG PO TBEC
40.0000 mg | DELAYED_RELEASE_TABLET | Freq: Every day | ORAL | Status: DC
  Administered 2015-07-20 – 2015-07-21 (×2): 40 mg via ORAL
  Filled 2015-07-19 (×2): qty 1

## 2015-07-19 MED ORDER — FENTANYL CITRATE (PF) 250 MCG/5ML IJ SOLN
INTRAMUSCULAR | Status: DC | PRN
  Administered 2015-07-19: 50 ug via INTRAVENOUS

## 2015-07-19 MED ORDER — ROCURONIUM BROMIDE 50 MG/5ML IV SOLN
INTRAVENOUS | Status: AC
  Filled 2015-07-19: qty 1

## 2015-07-19 MED ORDER — OXYCODONE-ACETAMINOPHEN 5-325 MG PO TABS
ORAL_TABLET | ORAL | Status: AC
  Filled 2015-07-19: qty 1

## 2015-07-19 MED ORDER — ONDANSETRON HCL 4 MG/2ML IJ SOLN
INTRAMUSCULAR | Status: DC | PRN
  Administered 2015-07-19: 4 mg via INTRAVENOUS

## 2015-07-19 MED ORDER — ONDANSETRON HCL 4 MG PO TABS
4.0000 mg | ORAL_TABLET | Freq: Four times a day (QID) | ORAL | Status: DC | PRN
  Administered 2015-07-20 – 2015-07-21 (×2): 4 mg via ORAL
  Filled 2015-07-19 (×2): qty 1

## 2015-07-19 MED ORDER — PROPOFOL 10 MG/ML IV BOLUS
INTRAVENOUS | Status: AC
  Filled 2015-07-19: qty 20

## 2015-07-19 MED ORDER — OXYCODONE HCL 5 MG PO TABS
5.0000 mg | ORAL_TABLET | ORAL | Status: DC | PRN
  Administered 2015-07-19 – 2015-07-21 (×10): 10 mg via ORAL
  Filled 2015-07-19 (×10): qty 2

## 2015-07-19 MED ORDER — ESCITALOPRAM OXALATE 10 MG PO TABS
10.0000 mg | ORAL_TABLET | Freq: Every day | ORAL | Status: DC
  Administered 2015-07-20 – 2015-07-21 (×2): 10 mg via ORAL
  Filled 2015-07-19 (×3): qty 1

## 2015-07-19 MED ORDER — PROPOFOL 10 MG/ML IV BOLUS
INTRAVENOUS | Status: DC | PRN
  Administered 2015-07-19: 150 mg via INTRAVENOUS

## 2015-07-19 MED ORDER — GABAPENTIN 300 MG PO CAPS
600.0000 mg | ORAL_CAPSULE | Freq: Three times a day (TID) | ORAL | Status: DC
  Administered 2015-07-19 – 2015-07-21 (×7): 600 mg via ORAL
  Filled 2015-07-19 (×7): qty 2

## 2015-07-19 MED ORDER — FUROSEMIDE 40 MG PO TABS
40.0000 mg | ORAL_TABLET | Freq: Every day | ORAL | Status: DC
  Administered 2015-07-20 – 2015-07-21 (×2): 40 mg via ORAL
  Filled 2015-07-19 (×2): qty 1

## 2015-07-19 MED ORDER — HYDROMORPHONE HCL 1 MG/ML IJ SOLN
0.2500 mg | INTRAMUSCULAR | Status: DC | PRN
  Administered 2015-07-19 (×3): 0.5 mg via INTRAVENOUS

## 2015-07-19 MED ORDER — LIDOCAINE HCL (CARDIAC) 20 MG/ML IV SOLN
INTRAVENOUS | Status: DC | PRN
  Administered 2015-07-19: 60 mg via INTRAVENOUS

## 2015-07-19 MED ORDER — SODIUM CHLORIDE 0.9 % IV SOLN: INTRAVENOUS | Status: DC

## 2015-07-19 MED ORDER — OXYCODONE-ACETAMINOPHEN 5-325 MG PO TABS: 1.0000 | ORAL_TABLET | ORAL | Status: DC | PRN

## 2015-07-19 MED ORDER — METHOCARBAMOL 500 MG PO TABS
ORAL_TABLET | ORAL | Status: AC
  Filled 2015-07-19: qty 1

## 2015-07-19 MED ORDER — HYDROCHLOROTHIAZIDE 25 MG PO TABS
25.0000 mg | ORAL_TABLET | Freq: Every day | ORAL | Status: DC
  Administered 2015-07-20 – 2015-07-21 (×2): 25 mg via ORAL
  Filled 2015-07-19 (×2): qty 1

## 2015-07-19 MED ORDER — 0.9 % SODIUM CHLORIDE (POUR BTL) OPTIME
TOPICAL | Status: DC | PRN
  Administered 2015-07-19: 1000 mL

## 2015-07-19 MED ORDER — ASPIRIN EC 325 MG PO TBEC
325.0000 mg | DELAYED_RELEASE_TABLET | Freq: Every day | ORAL | Status: DC
Start: 2015-07-19 — End: 2015-07-21
  Administered 2015-07-20 – 2015-07-21 (×2): 325 mg via ORAL
  Filled 2015-07-19 (×2): qty 1

## 2015-07-19 MED ORDER — METHOCARBAMOL 500 MG PO TABS
500.0000 mg | ORAL_TABLET | Freq: Four times a day (QID) | ORAL | Status: DC | PRN
  Administered 2015-07-19 – 2015-07-21 (×3): 500 mg via ORAL
  Filled 2015-07-19 (×2): qty 1

## 2015-07-19 MED ORDER — LACTATED RINGERS IV SOLN
INTRAVENOUS | Status: DC
  Administered 2015-07-19: 08:00:00 via INTRAVENOUS

## 2015-07-19 MED ORDER — METOCLOPRAMIDE HCL 5 MG/ML IJ SOLN: 5.0000 mg | Freq: Three times a day (TID) | INTRAMUSCULAR | Status: DC | PRN

## 2015-07-19 MED ORDER — ONDANSETRON HCL 4 MG/2ML IJ SOLN
4.0000 mg | Freq: Four times a day (QID) | INTRAMUSCULAR | Status: DC | PRN
  Administered 2015-07-19: 4 mg via INTRAVENOUS
  Filled 2015-07-19: qty 2

## 2015-07-19 MED ORDER — DEXAMETHASONE SODIUM PHOSPHATE 4 MG/ML IJ SOLN
INTRAMUSCULAR | Status: DC | PRN
  Administered 2015-07-19: 4 mg via INTRAVENOUS

## 2015-07-19 MED ORDER — CEFAZOLIN SODIUM 1-5 GM-% IV SOLN
1.0000 g | Freq: Four times a day (QID) | INTRAVENOUS | Status: AC
  Administered 2015-07-19 – 2015-07-20 (×3): 1 g via INTRAVENOUS
  Filled 2015-07-19 (×4): qty 50

## 2015-07-19 SURGICAL SUPPLY — 48 items
BANDAGE ESMARK 6X9 LF (GAUZE/BANDAGES/DRESSINGS) ×1 IMPLANT
BLADE SAW RECIP 87.9 MT (BLADE) ×2 IMPLANT
BNDG CMPR 9X6 STRL LF SNTH (GAUZE/BANDAGES/DRESSINGS)
BNDG COHESIVE 6X5 TAN STRL LF (GAUZE/BANDAGES/DRESSINGS) ×2 IMPLANT
BNDG ESMARK 6X9 LF (GAUZE/BANDAGES/DRESSINGS)
BNDG GAUZE STRTCH 6 (GAUZE/BANDAGES/DRESSINGS) IMPLANT
COVER SURGICAL LIGHT HANDLE (MISCELLANEOUS) ×6 IMPLANT
CUFF TOURNIQUET SINGLE 34IN LL (TOURNIQUET CUFF) IMPLANT
DRAIN PENROSE 1/2X12 LTX STRL (WOUND CARE) IMPLANT
DRAPE EXTREMITY T 121X128X90 (DRAPE) ×3 IMPLANT
DRAPE PROXIMA HALF (DRAPES) ×6 IMPLANT
DRAPE U-SHAPE 47X51 STRL (DRAPES) ×6 IMPLANT
DRSG ADAPTIC 3X8 NADH LF (GAUZE/BANDAGES/DRESSINGS) ×1 IMPLANT
DRSG AQUACEL AG ADV 3.5X14 (GAUZE/BANDAGES/DRESSINGS) ×2 IMPLANT
DURAPREP 26ML APPLICATOR (WOUND CARE) ×3 IMPLANT
ELECT CAUTERY BLADE 6.4 (BLADE) IMPLANT
ELECT REM PT RETURN 9FT ADLT (ELECTROSURGICAL) ×3
ELECTRODE REM PT RTRN 9FT ADLT (ELECTROSURGICAL) ×1 IMPLANT
EVACUATOR 1/8 PVC DRAIN (DRAIN) IMPLANT
GAUZE SPONGE 4X4 12PLY STRL (GAUZE/BANDAGES/DRESSINGS) ×1 IMPLANT
GLOVE BIOGEL PI IND STRL 9 (GLOVE) ×1 IMPLANT
GLOVE BIOGEL PI INDICATOR 9 (GLOVE) ×2
GLOVE SURG ORTHO 9.0 STRL STRW (GLOVE) ×3 IMPLANT
GOWN STRL REUS W/ TWL XL LVL3 (GOWN DISPOSABLE) ×2 IMPLANT
GOWN STRL REUS W/TWL XL LVL3 (GOWN DISPOSABLE) ×6
KIT BASIN OR (CUSTOM PROCEDURE TRAY) ×3 IMPLANT
KIT ROOM TURNOVER OR (KITS) ×3 IMPLANT
MANIFOLD NEPTUNE II (INSTRUMENTS) ×3 IMPLANT
NS IRRIG 1000ML POUR BTL (IV SOLUTION) ×3 IMPLANT
PACK GENERAL/GYN (CUSTOM PROCEDURE TRAY) ×3 IMPLANT
PAD ARMBOARD 7.5X6 YLW CONV (MISCELLANEOUS) ×6 IMPLANT
PAD CAST 4YDX4 CTTN HI CHSV (CAST SUPPLIES) ×1 IMPLANT
PADDING CAST COTTON 4X4 STRL (CAST SUPPLIES) ×3
PADDING CAST COTTON 6X4 STRL (CAST SUPPLIES) ×3 IMPLANT
SPONGE LAP 18X18 X RAY DECT (DISPOSABLE) IMPLANT
STAPLER VISISTAT 35W (STAPLE) ×2 IMPLANT
STOCKINETTE IMPERVIOUS LG (DRAPES) IMPLANT
SUT ETHILON 2 0 PSLX (SUTURE) ×4 IMPLANT
SUT PDS AB 1 CT  36 (SUTURE)
SUT PDS AB 1 CT 36 (SUTURE) IMPLANT
SUT SILK 2 0 (SUTURE) ×3
SUT SILK 2-0 18XBRD TIE 12 (SUTURE) ×1 IMPLANT
SUT VIC AB 1 CTX 36 (SUTURE)
SUT VIC AB 1 CTX36XBRD ANBCTR (SUTURE) IMPLANT
TOWEL OR 17X24 6PK STRL BLUE (TOWEL DISPOSABLE) ×3 IMPLANT
TOWEL OR 17X26 10 PK STRL BLUE (TOWEL DISPOSABLE) ×3 IMPLANT
TUBE ANAEROBIC SPECIMEN COL (MISCELLANEOUS) IMPLANT
WATER STERILE IRR 1000ML POUR (IV SOLUTION) ×3 IMPLANT

## 2015-07-19 NOTE — Progress Notes (Signed)
Pt states pain level high. Not relieved by oral meds. Oxygen need at 3L / min to maintain O2 Saturation levels above 90. Heart Rate 100-110. Dr. Lajoyce Cornersuda updated.

## 2015-07-19 NOTE — Op Note (Signed)
07/19/2015  9:20 AM  PATIENT:  Amanda PernaMona Wolak    PRE-OPERATIVE DIAGNOSIS:  Breakdown of Left Above Knee Amputation  POST-OPERATIVE DIAGNOSIS:  Same  PROCEDURE:  Revision Left Above Knee Amputation  SURGEON:  Nadara MustardUDA,MARCUS V, MD  PHYSICIAN ASSISTANT:None ANESTHESIA:   General  PREOPERATIVE INDICATIONS:  Amanda Lewis is a  59 y.o. female with a diagnosis of Breakdown of Left Above Knee Amputation who failed conservative measures and elected for surgical management.    The risks benefits and alternatives were discussed with the patient preoperatively including but not limited to the risks of infection, bleeding, nerve injury, cardiopulmonary complications, the need for revision surgery, among others, and the patient was willing to proceed.  OPERATIVE IMPLANTS: None  OPERATIVE FINDINGS: Good petechial bleeding no deep abscess no signs of bone infection  OPERATIVE PROCEDURE: Patient is a [age] year old woman who presents with breakdown of her left above-the-knee amputation. Patient was brought to the operating room and underwent a general anesthetic. After adequate levels anesthesia were obtained patient's left lower extremity was prepped using DuraPrep draped into a sterile field. A timeout was called. Elliptical incision was made around the area of skin breakdown the distal 3 cm of bone and soft tissue were resected in 1 wedge block of tissue. Electrocautery was used for hemostasis. The wound was irrigated with normal saline. The deep fascial layers were closed using #1 Vicryl. The skin was closed using 2-0 nylon. A aqua cell dressing was applied patient was extubated taken to the PACU in stable condition.

## 2015-07-19 NOTE — H&P (Signed)
Rodell PernaMona Pauwels is an 59 y.o. female.   Chief Complaint: Breakdown left above-the-knee amputation. HPI: Patient is a 59 year old woman with multiple medical problems who has had progressive breakdown with her left above-the-knee amputation. She now has pending skin breakdown with muscle atrophy.  Past Medical History  Diagnosis Date  . Stroke (HCC) 1997  . Asthma     exertional  . COPD (chronic obstructive pulmonary disease) (HCC)     usesO2 24/7  . Anemia   . Dysuria-frequency syndrome   . GERD (gastroesophageal reflux disease)   . Arthritis     arms  . Rotator cuff tear, right   . Anxiety     on treatment  . Chronic lower back pain   . RSD lower limb     left lower leg  . Recurrent upper respiratory infection (URI)   . Depression   . Sleep apnea 2010    uses continous oxygen  . Pneumonia   . History of kidney stones   . Headache(784.0)     intermittent migraines  . Multiple thyroid nodules     Past Surgical History  Procedure Laterality Date  . Orif ankle fracture  1995  . Appendectomy    . Abdominal hysterectomy  2003  . Fracture surgery    . Morphine pump    . Morphine pump removed  2006  . Amputation  01/12/2012    Procedure: AMPUTATION ABOVE KNEE;  Surgeon: Nadara MustardMarcus V Duda, MD;  Location: [facility] OR;  Service: Orthopedics;  Laterality: Left;  Left Above Knee Amputation    Family History  Problem Relation Age of Onset  . Anesthesia problems Neg Hx    Social History:  reports that she quit smoking about 6 years ago. Her smoking use included Cigarettes. She has a 70 pack-year smoking history. She has never used smokeless tobacco. She reports that she does not drink alcohol or use illicit drugs.  Allergies:  Allergies  Allergen Reactions  . Aspirin Hives  . Bactrim [Sulfamethoxazole-Trimethoprim] Hives  . Nitroglycerin Other (See Comments)    Blood pressure drops dangerously low  . Percodan [Oxycodone-Aspirin] Hives  . Tape Rash    Adhesive    Medications Prior to  Admission  Medication Sig Dispense Refill  . albuterol (PROVENTIL HFA;VENTOLIN HFA) 108 (90 BASE) MCG/ACT inhaler Inhale 2 puffs into the lungs every 6 (six) hours as needed. For shortness of breath     . atorvastatin (LIPITOR) 40 MG tablet Take 40 mg by mouth daily.  2  . DULoxetine (CYMBALTA) 60 MG capsule Take 60 mg by mouth daily.      Marland Kitchen. escitalopram (LEXAPRO) 10 MG tablet Take 10 mg by mouth daily.  2  . furosemide (LASIX) 40 MG tablet Take 40 mg by mouth daily.  1  . gabapentin (NEURONTIN) 300 MG capsule Take 600 mg by mouth 3 (three) times daily.  2  . hydrochlorothiazide (HYDRODIURIL) 25 MG tablet Take 25 mg by mouth daily.      Marland Kitchen. omeprazole (PRILOSEC) 40 MG capsule Take 40 mg by mouth daily.  2  . QUEtiapine (SEROQUEL) 25 MG tablet Take 25 mg by mouth 2 (two) times daily.     . theophylline (THEODUR) 300 MG 12 hr tablet Take 300 mg by mouth 2 (two) times daily.      . traMADol (ULTRAM) 50 MG tablet Take 50 mg by mouth 2 (two) times daily as needed. for pain  0  . warfarin (COUMADIN) 1 MG tablet Take 1 tablet (1  mg total) by mouth daily. (Patient not taking: Reported on 07/19/2015) 30 tablet 0    No results found for this or any previous visit (from the past 48 hour(s)). No results found.  Review of Systems  All other systems reviewed and are negative.   Blood pressure 123/69, pulse 106, temperature 98.1 F (36.7 C), temperature source Oral, resp. rate 18, height 5' 5.5" (1.664 m), weight 73.483 kg (162 lb), SpO2 96 %. Physical Exam  On examination patient has atrophy of the muscles of the left thigh the own has pending breakdown of the skin with essentially a very thin skin envelope around the distal femur. Assessment/Plan Assessment: Breakdown left above-knee dictation.  Plan: We will plan for revision of the left above-the-knee amputation. Risk and benefits were discussed patient states she understands wish to proceed at this time.  DUDA,MARCUS V 07/19/2015, 7:35  AM

## 2015-07-19 NOTE — Transfer of Care (Signed)
Immediate Anesthesia Transfer of Care Note  Patient: Amanda PernaMona Vandagriff  Procedure(s) Performed: Procedure(s): Revision Left Above Knee Amputation (Left)  Patient Location: PACU  Anesthesia Type:General  Level of Consciousness: awake  Airway & Oxygen Therapy: Patient Spontanous Breathing and Patient connected to nasal cannula oxygen  Post-op Assessment: Report given to RN and Post -op Vital signs reviewed and stable  Post vital signs: Reviewed and stable  Last Vitals:  Filed Vitals:   07/19/15 0630  BP: 123/69  Pulse: 106  Temp: 36.7 C  Resp: 18    Complications: No apparent anesthesia complications

## 2015-07-19 NOTE — Anesthesia Postprocedure Evaluation (Signed)
  Anesthesia Post-op Note  Patient: Amanda Lewis  Procedure(s) Performed: Procedure(s): Revision Left Above Knee Amputation (Left)  Patient Location: PACU  Anesthesia Type:General  Level of Consciousness: awake and alert   Airway and Oxygen Therapy: Patient Spontanous Breathing  Post-op Pain: Controlled  Post-op Assessment: Post-op Vital signs reviewed, Patient's Cardiovascular Status Stable and Respiratory Function Stable  Post-op Vital Signs: Reviewed  Filed Vitals:   07/19/15 1045  BP: 113/62  Pulse: 102  Temp:   Resp: 9    Complications: No apparent anesthesia complications

## 2015-07-19 NOTE — Anesthesia Procedure Notes (Signed)
Procedure Name: LMA Insertion Date/Time: 07/19/2015 8:45 AM Performed by: Alanda AmassFRIEDMAN, Asuka Dusseau A Pre-anesthesia Checklist: Patient identified, Emergency Drugs available, Suction available, Patient being monitored and Timeout performed Patient Re-evaluated:Patient Re-evaluated prior to inductionOxygen Delivery Method: Circle system utilized Preoxygenation: Pre-oxygenation with 100% oxygen Intubation Type: IV induction LMA: LMA inserted LMA Size: 4.0 Number of attempts: 1 Placement Confirmation: positive ETCO2 and breath sounds checked- equal and bilateral Tube secured with: Tape Dental Injury: Teeth and Oropharynx as per pre-operative assessment

## 2015-07-19 NOTE — Anesthesia Preprocedure Evaluation (Addendum)
Anesthesia Evaluation  Patient identified by MRN, date of birth, ID band Patient awake    Reviewed: Allergy & Precautions, H&P , NPO status , Patient's Chart, lab work & pertinent test results  Airway Mallampati: III  TM Distance: >3 FB Neck ROM: Full    Dental no notable dental hx. (+) Upper Dentures, Edentulous Lower, Dental Advisory Given   Pulmonary asthma , sleep apnea and Oxygen sleep apnea , COPD,  COPD inhaler and oxygen dependent, former smoker,    Pulmonary exam normal breath sounds clear to auscultation       Cardiovascular negative cardio ROS   Rhythm:Regular Rate:Normal     Neuro/Psych  Headaches, Anxiety Depression CVA    GI/Hepatic Neg liver ROS, GERD  Medicated and Controlled,  Endo/Other  negative endocrine ROS  Renal/GU negative Renal ROS  negative genitourinary   Musculoskeletal  (+) Arthritis , Osteoarthritis,    Abdominal   Peds  Hematology negative hematology ROS (+)   Anesthesia Other Findings   Reproductive/Obstetrics negative OB ROS                           Anesthesia Physical Anesthesia Plan  ASA: III  Anesthesia Plan: General   Post-op Pain Management:    Induction: Intravenous  Airway Management Planned: LMA  Additional Equipment:   Intra-op Plan:   Post-operative Plan: Extubation in OR  Informed Consent: I have reviewed the patients History and Physical, chart, labs and discussed the procedure including the risks, benefits and alternatives for the proposed anesthesia with the patient or authorized representative who has indicated his/her understanding and acceptance.   Dental advisory given  Plan Discussed with: CRNA  Anesthesia Plan Comments:         Anesthesia Quick Evaluation

## 2015-07-20 DIAGNOSIS — K219 Gastro-esophageal reflux disease without esophagitis: Secondary | ICD-10-CM | POA: Diagnosis not present

## 2015-07-20 DIAGNOSIS — F419 Anxiety disorder, unspecified: Secondary | ICD-10-CM | POA: Diagnosis not present

## 2015-07-20 DIAGNOSIS — T8781 Dehiscence of amputation stump: Secondary | ICD-10-CM | POA: Diagnosis not present

## 2015-07-20 DIAGNOSIS — M545 Low back pain: Secondary | ICD-10-CM | POA: Diagnosis not present

## 2015-07-20 DIAGNOSIS — J449 Chronic obstructive pulmonary disease, unspecified: Secondary | ICD-10-CM | POA: Diagnosis not present

## 2015-07-20 DIAGNOSIS — R300 Dysuria: Secondary | ICD-10-CM | POA: Diagnosis not present

## 2015-07-20 LAB — GLUCOSE, CAPILLARY
GLUCOSE-CAPILLARY: 110 mg/dL — AB (ref 65–99)
Glucose-Capillary: 126 mg/dL — ABNORMAL HIGH (ref 65–99)

## 2015-07-20 NOTE — Evaluation (Signed)
Physical Therapy Evaluation Patient Details Name: Amanda PernaMona Andrades MRN: [id number] DOB: 1956-03-20 Today's Date: 07/20/2015   History of Present Illness  Admitted for revision of L AKA;  has a past medical history of Stroke (HCC) (1997); Asthma; COPD (chronic obstructive pulmonary disease) (HCC); Anemia; Dysuria-frequency syndrome; GERD (gastroesophageal reflux disease); Arthritis; Rotator cuff tear, right; Anxiety; Chronic lower back pain; RSD lower limb; Recurrent upper respiratory infection (URI); Depression; Sleep apnea (2010); Pneumonia; History of kidney stones; Headache(784.0); and Multiple thyroid nodules.  has past surgical history that includes ORIF ankle fracture (1995); Appendectomy; Abdominal hysterectomy (2003); Fracture surgery; Morphine Pump; Morphine Pump Removed (2006); and Amputation (01/12/2012).  Clinical Impression   Patient is s/p above surgery resulting in functional limitations due to the deficits listed below (see PT Problem List).  Patient will benefit from skilled PT to increase their independence and safety with mobility to allow discharge to the venue listed below.       Follow Up Recommendations Home health PT    Equipment Recommendations  Other (comment) (Motorized wheelchair/scooter) Worth considering a power chair to maximize independence   Recommendations for Other Services OT consult     Precautions / Restrictions Precautions Precautions: Fall      Mobility  Bed Mobility Overal bed mobility: Needs Assistance Bed Mobility: Supine to Sit     Supine to sit: Min assist     General bed mobility comments: Min handheld assist to pull to sit; overall inefficient movement patterns  Transfers Overall transfer level: Needs assistance Equipment used: 1 person hand held assist Transfers: Stand Pivot Transfers   Stand pivot transfers: Mod assist       General transfer comment: squat pivot transfer bed to [facility] to recliner; mod assist to help with hygeine  while Raynelle FanningMona supported herself with both arms on aremrests of [facility]  Ambulation/Gait                Stairs            Wheelchair Mobility    Modified Rankin (Stroke Patients Only)       Balance Overall balance assessment: Needs assistance           Standing balance-Leahy Scale: Zero                               Pertinent Vitals/Pain Pain Assessment: 0-10 Pain Score: 10-Worst pain ever Pain Location: L residual limb with moving Pain Descriptors / Indicators: Aching Pain Intervention(s): Limited activity within patient's tolerance;Monitored during session;Premedicated before session;Repositioned    Home Living Family/patient expects to be discharged to:: Private residence Living Arrangements: Alone;Other (Comment) (Brother in law can stay with her initially) Available Help at Discharge: Family;Available 24 hours/day (for the first couple of weeks home) Type of Home: Mobile home Home Access: Ramped entrance     Home Layout: One level Home Equipment: Bedside commode;Walker - 2 wheels;Wheelchair - manual      Prior Function Level of Independence: Independent with assistive device(s)               Hand Dominance        Extremity/Trunk Assessment   Upper Extremity Assessment: Generalized weakness           Lower Extremity Assessment: LLE deficits/detail   LLE Deficits / Details: L residual limb quite painful with any motion; pain subsides relatively quickly with rest     Communication   Communication: No difficulties  Cognition Arousal/Alertness: Awake/alert  Behavior During Therapy: WFL for tasks assessed/performed Overall Cognitive Status: Within Functional Limits for tasks assessed                      General Comments      Exercises        Assessment/Plan    PT Assessment Patient needs continued PT services  PT Diagnosis Acute pain;Generalized weakness   PT Problem List Decreased strength;Decreased  range of motion;Decreased activity tolerance;Decreased balance;Decreased mobility;Decreased knowledge of use of DME;Pain;Decreased knowledge of precautions  PT Treatment Interventions DME instruction;Functional mobility training;Therapeutic activities;Therapeutic exercise;Balance training;Patient/family education;Wheelchair mobility training   PT Goals (Current goals can be found in the Care Plan section) Acute Rehab PT Goals Patient Stated Goal: Hopes to be able to get home PT Goal Formulation: With patient Time For Goal Achievement: 08/03/15 Potential to Achieve Goals: Good    Frequency Min 3X/week   Barriers to discharge   Need to verify that pt's Brother in law will be able to assist once pt is home    Co-evaluation               End of Session Equipment Utilized During Treatment: Gait belt Activity Tolerance: Patient limited by pain Patient left: in chair;with call bell/phone within reach Nurse Communication: Mobility status    Functional Assessment Tool Used: Clinical Judgement Functional Limitation: Mobility: Walking and moving around Mobility: Walking and Moving Around Current Status (Z6109): At least 20 percent but less than 40 percent impaired, limited or restricted Mobility: Walking and Moving Around Goal Status [phone]): 0 percent impaired, limited or restricted    Time: 0981-1914 PT Time Calculation (min) (ACUTE ONLY): 33 min   Charges:   PT Evaluation $Initial PT Evaluation Tier I: 1 Procedure PT Treatments $Therapeutic Activity: 8-22 mins   PT G Codes:   PT G-Codes **NOT FOR INPATIENT CLASS** Functional Assessment Tool Used: Clinical Judgement Functional Limitation: Mobility: Walking and moving around Mobility: Walking and Moving Around Current Status (N8295): At least 20 percent but less than 40 percent impaired, limited or restricted Mobility: Walking and Moving Around Goal Status [phone]): 0 percent impaired, limited or restricted    Van Clines  [facility] 07/20/2015, 1:59 PM  Van Clines, PT  Acute Rehabilitation Services Pager [phone] Office [phone]

## 2015-07-20 NOTE — Progress Notes (Signed)
Subjective: 1 Day Post-Op Procedure(s) (LRB): Revision Left Above Knee Amputation (Left) Patient reports pain as severe.     ( past Hx of pain clinics, fentanyl patch use for 9 yrs in past )  Objective: Vital signs in last 24 hours: Temp:  [98.5 F (36.9 C)] 98.5 F (36.9 C) (11/20 0524) Pulse Rate:  [90-98] 90 (11/20 0524) Resp:  [14-17] 16 (11/20 0524) BP: (109-112)/(58-74) 112/58 mmHg (11/20 0524) SpO2:  [95 %-99 %] 99 % (11/20 0524)  Intake/Output from previous day: 11/19 0701 - 11/20 0700 In: 1170 [P.O.:340; I.V.:730; IV Piggyback:100] Out: 15 [Blood:15] Intake/Output this shift:     Recent Labs  07/19/15 0759  HGB 12.5    Recent Labs  07/19/15 0759  WBC 9.9  RBC 4.28  HCT 40.4  PLT 243    Recent Labs  07/19/15 0759  NA 140  K 3.8  CL 102  CO2 31  BUN 13  CREATININE 0.56  GLUCOSE 114*  CALCIUM 9.6    Recent Labs  07/19/15 0759  INR 1.00    dressing dry  Assessment/Plan: 1 Day Post-Op Procedure(s) (LRB): Revision Left Above Knee Amputation (Left) Up with therapy  Analisa Sledd C 07/20/2015, 2:07 PM

## 2015-07-21 ENCOUNTER — Encounter (HOSPITAL_COMMUNITY): Payer: Self-pay | Admitting: Orthopedic Surgery

## 2015-07-21 DIAGNOSIS — T8781 Dehiscence of amputation stump: Secondary | ICD-10-CM | POA: Diagnosis not present

## 2015-07-21 DIAGNOSIS — F419 Anxiety disorder, unspecified: Secondary | ICD-10-CM | POA: Diagnosis not present

## 2015-07-21 DIAGNOSIS — R300 Dysuria: Secondary | ICD-10-CM | POA: Diagnosis not present

## 2015-07-21 DIAGNOSIS — M545 Low back pain: Secondary | ICD-10-CM | POA: Diagnosis not present

## 2015-07-21 DIAGNOSIS — K219 Gastro-esophageal reflux disease without esophagitis: Secondary | ICD-10-CM | POA: Diagnosis not present

## 2015-07-21 DIAGNOSIS — J449 Chronic obstructive pulmonary disease, unspecified: Secondary | ICD-10-CM | POA: Diagnosis not present

## 2015-07-21 NOTE — Progress Notes (Signed)
Physical Therapy Treatment Patient Details Name: Amanda Lewis MRN: [id number] DOB: 1955-09-08 Today's Date: 07/21/2015    History of Present Illness Admitted for revision of L AKA;  has a past medical history of Stroke (HCC) (1997); Asthma; COPD (chronic obstructive pulmonary disease) (HCC); Anemia; Dysuria-frequency syndrome; GERD (gastroesophageal reflux disease); Arthritis; Rotator cuff tear, right; Anxiety; Chronic lower back pain; RSD lower limb; Recurrent upper respiratory infection (URI); Depression; Sleep apnea (2010); Pneumonia; History of kidney stones; Headache(784.0); and Multiple thyroid nodules.  has past surgical history that includes ORIF ankle fracture (1995); Appendectomy; Abdominal hysterectomy (2003); Fracture surgery; Morphine Pump; Morphine Pump Removed (2006); and Amputation (01/12/2012).    PT Comments    Amanda Lewis's activity tolerance today was limited by headache, but she still participated and needed less assist with transfer than yesterday; She confirmed that her brother-in-law and a helper will be able to provide 24 hour assist in her home for the first few weeks home   Follow Up Recommendations  Home health PT     Equipment Recommendations  Other (comment) (motorized wheelchair/scooter)  This can be addressed by HHPT and/or at Ortho follow-up appontments   Recommendations for Other Services       Precautions / Restrictions Precautions Precautions: Fall Restrictions LLE Weight Bearing: Non weight bearing    Mobility  Bed Mobility Overal bed mobility: Needs Assistance Bed Mobility: Supine to Sit     Supine to sit: Min assist     General bed mobility comments: Min handheld assist to pull to sit; overall inefficient movement patterns  Transfers Overall transfer level: Needs assistance Equipment used: 1 person hand held assist Transfers: Stand Pivot Transfers   Stand pivot transfers: Min guard       General transfer comment: squat pivot transfer bed  to recliner; minguard assist for safety; painful, but Amanda Lewis performed transfer well  Ambulation/Gait                 Stairs            Wheelchair Mobility    Modified Rankin (Stroke Patients Only)       Balance                                    Cognition Arousal/Alertness: Awake/alert Behavior During Therapy: WFL for tasks assessed/performed Overall Cognitive Status: Within Functional Limits for tasks assessed                      Exercises      General Comments        Pertinent Vitals/Pain Pain Assessment: Faces Faces Pain Scale: Hurts whole lot Pain Location: L residual limb with moving; seems to subside quickly; pt also with headache she did not rate Pain Descriptors / Indicators: Aching;Sore Pain Intervention(s): Limited activity within patient's tolerance;Monitored during session    Home Living                      Prior Function            PT Goals (current goals can now be found in the care plan section) Acute Rehab PT Goals Patient Stated Goal: Hopes to be able to get home PT Goal Formulation: With patient Time For Goal Achievement: 08/03/15 Potential to Achieve Goals: Good Progress towards PT goals: Progressing toward goals    Frequency  Min 3X/week    PT Plan Current plan remains  appropriate    Co-evaluation             End of Session   Activity Tolerance: Patient tolerated treatment well Patient left: in chair;with call bell/phone within reach     Time: [id number] PT Time Calculation (min) (ACUTE ONLY): 18 min  Charges:  $Therapeutic Activity: 8-22 mins                    G Codes:      Olen PelGarrigan, Rozlynn Lippold Hamff 07/21/2015, 4:34 PM  Van ClinesHolly Alyannah Sanks, [place]  Acute Rehabilitation Services Pager [phone] Office [phone]'

## 2015-07-21 NOTE — Progress Notes (Signed)
Awaiting for friend to pick her up after1800 hrs

## 2015-07-21 NOTE — Discharge Summary (Signed)
Physician Discharge Summary  Patient ID: Amanda Lewis MRN: [id number] DOB/AGE: 60-23-1957 60 y.o.  Admit date: 07/19/2015 Discharge date: 07/21/2015  Admission Diagnoses: Dehiscence above-the-knee amputation  Discharge Diagnoses: Revision above-the-knee amputation Active Problems:   S/P AKA (above knee amputation) [facility])   Discharged Condition: stable  Hospital Course: Patient's hospital course was essentially unremarkable. She underwent revision of the above knee amputation and was discharged to home in stable condition.  Consults: None  Significant Diagnostic Studies: labs: Routine labs  Treatments: surgery: See operative note  Discharge Exam: Blood pressure 127/84, pulse 120, temperature 98.2 F (36.8 C), temperature source Oral, resp. rate 17, height 5' 5.5" (1.664 m), weight 73.483 kg (162 lb), SpO2 95 %. Incision/Wound: dressing clean and dry  Disposition: 03-Skilled Nursing Facility  Discharge Instructions    Call MD / Call 911    Complete by:  As directed   If you experience chest pain or shortness of breath, CALL 911 and be transported to the hospital emergency room.  If you develope a fever above 101 F, pus (white drainage) or increased drainage or redness at the wound, or calf pain, call your surgeon's office.     Call MD / Call 911    Complete by:  As directed   If you experience chest pain or shortness of breath, CALL 911 and be transported to the hospital emergency room.  If you develope a fever above 101 F, pus (white drainage) or increased drainage or redness at the wound, or calf pain, call your surgeon's office.     Constipation Prevention    Complete by:  As directed   Drink plenty of fluids.  Prune juice may be helpful.  You may use a stool softener, such as Colace (over the counter) 100 mg twice a day.  Use MiraLax (over the counter) for constipation as needed.     Constipation Prevention    Complete by:  As directed   Drink plenty of fluids.  Prune juice  may be helpful.  You may use a stool softener, such as Colace (over the counter) 100 mg twice a day.  Use MiraLax (over the counter) for constipation as needed.     Diet - low sodium heart healthy    Complete by:  As directed      Diet - low sodium heart healthy    Complete by:  As directed      Increase activity slowly as tolerated    Complete by:  As directed      Increase activity slowly as tolerated    Complete by:  As directed      Non weight bearing    Complete by:  As directed   Laterality:  left  Extremity:  Lower            Medication List    STOP taking these medications        warfarin 1 MG tablet  Commonly known as:  COUMADIN      TAKE these medications        albuterol 108 (90 BASE) MCG/ACT inhaler  Commonly known as:  PROVENTIL HFA;VENTOLIN HFA  Inhale 2 puffs into the lungs every 6 (six) hours as needed. For shortness of breath     atorvastatin 40 MG tablet  Commonly known as:  LIPITOR  Take 40 mg by mouth daily.     DULoxetine 60 MG capsule  Commonly known as:  CYMBALTA  Take 60 mg by mouth daily.     escitalopram  10 MG tablet  Commonly known as:  LEXAPRO  Take 10 mg by mouth daily.     furosemide 40 MG tablet  Commonly known as:  LASIX  Take 40 mg by mouth daily.     gabapentin 300 MG capsule  Commonly known as:  NEURONTIN  Take 600 mg by mouth 3 (three) times daily.     hydrochlorothiazide 25 MG tablet  Commonly known as:  HYDRODIURIL  Take 25 mg by mouth daily.     omeprazole 40 MG capsule  Commonly known as:  PRILOSEC  Take 40 mg by mouth daily.     oxyCODONE-acetaminophen 5-325 MG tablet  Commonly known as:  ROXICET  Take 1 tablet by mouth every 4 (four) hours as needed for severe pain.     QUEtiapine 25 MG tablet  Commonly known as:  SEROQUEL  Take 25 mg by mouth 2 (two) times daily.     theophylline 300 MG 12 hr tablet  Commonly known as:  THEODUR  Take 300 mg by mouth 2 (two) times daily.     traMADol 50 MG tablet   Commonly known as:  ULTRAM  Take 50 mg by mouth 2 (two) times daily as needed. for pain           Follow-up Information    Follow up with Karyl Sharrar V, MD In 2 weeks.   Specialty:  Orthopedic Surgery   Contact information:   [address] [place] [address] [phone]       Signed: [name],[name] 07/21/2015, 6:55 AM

## 2015-07-21 NOTE — Care Management Note (Signed)
Case Management Note  Patient Details  Name: Rodell PernaMona Nasby MRN: [id number] Date of Birth: 1956/08/26  Subjective/Objective:    59 yr old female admitted with left BKA wound dehiscence, patient underwent a left AKA.                 Action/Plan: Case manager spoke with patient concerning her home health needs at discharge.Choice was offered. Patient states [facility]. Case manager contacted Enrique SackKendra at  [facility] [phone],[phone],  faxed orders, H&P and demograhics to her at [phone]    Expected Discharge Date:  07/20/15               Expected Discharge Plan:   Home with Home Health  In-House Referral:     Discharge planning Services  CM Consult  Post Acute Care Choice:  Home Health Choice offered to:  Patient  DME Arranged:  N/A DME Agency:  NA  HH Arranged:  RN, PT HH [facility]  [facility]  Status of Service:  Completed, signed off  Medicare Important Message Given:    Date Medicare IM Given:    Medicare IM give by:    Date Additional Medicare IM Given:    Additional Medicare Important Message give by:     If discussed at Long Length of Stay Meetings, dates discussed:    Additional Comments:  Durenda GuthrieBrady, Jules Vidovich Naomi, RN 07/21/2015, 2:42 PM

## 2015-08-01 DIAGNOSIS — Z6826 Body mass index (BMI) 26.0-26.9, adult: Secondary | ICD-10-CM | POA: Diagnosis not present

## 2015-08-01 DIAGNOSIS — F329 Major depressive disorder, single episode, unspecified: Secondary | ICD-10-CM | POA: Diagnosis not present

## 2015-08-01 DIAGNOSIS — R739 Hyperglycemia, unspecified: Secondary | ICD-10-CM | POA: Diagnosis not present

## 2015-08-01 DIAGNOSIS — J449 Chronic obstructive pulmonary disease, unspecified: Secondary | ICD-10-CM | POA: Diagnosis not present

## 2015-08-01 DIAGNOSIS — M2550 Pain in unspecified joint: Secondary | ICD-10-CM | POA: Diagnosis not present

## 2015-08-01 DIAGNOSIS — E785 Hyperlipidemia, unspecified: Secondary | ICD-10-CM | POA: Diagnosis not present

## 2015-09-03 DIAGNOSIS — M8589 Other specified disorders of bone density and structure, multiple sites: Secondary | ICD-10-CM | POA: Diagnosis not present

## 2015-09-03 DIAGNOSIS — J449 Chronic obstructive pulmonary disease, unspecified: Secondary | ICD-10-CM | POA: Diagnosis not present

## 2015-09-03 DIAGNOSIS — Z79899 Other long term (current) drug therapy: Secondary | ICD-10-CM | POA: Diagnosis not present

## 2015-09-03 DIAGNOSIS — E785 Hyperlipidemia, unspecified: Secondary | ICD-10-CM | POA: Diagnosis not present

## 2015-09-03 DIAGNOSIS — Z6826 Body mass index (BMI) 26.0-26.9, adult: Secondary | ICD-10-CM | POA: Diagnosis not present

## 2015-09-03 DIAGNOSIS — E663 Overweight: Secondary | ICD-10-CM | POA: Diagnosis not present

## 2015-09-03 DIAGNOSIS — Z1231 Encounter for screening mammogram for malignant neoplasm of breast: Secondary | ICD-10-CM | POA: Diagnosis not present

## 2015-09-03 DIAGNOSIS — M2550 Pain in unspecified joint: Secondary | ICD-10-CM | POA: Diagnosis not present

## 2015-09-17 DIAGNOSIS — Z1231 Encounter for screening mammogram for malignant neoplasm of breast: Secondary | ICD-10-CM | POA: Diagnosis not present

## 2015-09-17 DIAGNOSIS — M810 Age-related osteoporosis without current pathological fracture: Secondary | ICD-10-CM | POA: Diagnosis not present

## 2015-09-30 DIAGNOSIS — R928 Other abnormal and inconclusive findings on diagnostic imaging of breast: Secondary | ICD-10-CM | POA: Diagnosis not present

## 2015-09-30 DIAGNOSIS — N6489 Other specified disorders of breast: Secondary | ICD-10-CM | POA: Diagnosis not present

## 2015-10-21 DIAGNOSIS — Z6826 Body mass index (BMI) 26.0-26.9, adult: Secondary | ICD-10-CM | POA: Diagnosis not present

## 2015-10-21 DIAGNOSIS — F419 Anxiety disorder, unspecified: Secondary | ICD-10-CM | POA: Diagnosis not present

## 2015-10-21 DIAGNOSIS — M810 Age-related osteoporosis without current pathological fracture: Secondary | ICD-10-CM | POA: Diagnosis not present

## 2015-10-21 DIAGNOSIS — M2550 Pain in unspecified joint: Secondary | ICD-10-CM | POA: Diagnosis not present

## 2015-10-21 DIAGNOSIS — G4700 Insomnia, unspecified: Secondary | ICD-10-CM | POA: Diagnosis not present

## 2015-10-21 DIAGNOSIS — E785 Hyperlipidemia, unspecified: Secondary | ICD-10-CM | POA: Diagnosis not present

## 2015-10-21 DIAGNOSIS — R739 Hyperglycemia, unspecified: Secondary | ICD-10-CM | POA: Diagnosis not present

## 2015-10-21 DIAGNOSIS — Z79899 Other long term (current) drug therapy: Secondary | ICD-10-CM | POA: Diagnosis not present

## 2015-11-19 DIAGNOSIS — Z1389 Encounter for screening for other disorder: Secondary | ICD-10-CM | POA: Diagnosis not present

## 2015-11-19 DIAGNOSIS — J069 Acute upper respiratory infection, unspecified: Secondary | ICD-10-CM | POA: Diagnosis not present

## 2015-11-19 DIAGNOSIS — F329 Major depressive disorder, single episode, unspecified: Secondary | ICD-10-CM | POA: Diagnosis not present

## 2015-11-19 DIAGNOSIS — E785 Hyperlipidemia, unspecified: Secondary | ICD-10-CM | POA: Diagnosis not present

## 2015-11-19 DIAGNOSIS — Z6826 Body mass index (BMI) 26.0-26.9, adult: Secondary | ICD-10-CM | POA: Diagnosis not present

## 2015-11-19 DIAGNOSIS — M2550 Pain in unspecified joint: Secondary | ICD-10-CM | POA: Diagnosis not present

## 2015-11-19 DIAGNOSIS — J449 Chronic obstructive pulmonary disease, unspecified: Secondary | ICD-10-CM | POA: Diagnosis not present

## 2015-12-05 DIAGNOSIS — J069 Acute upper respiratory infection, unspecified: Secondary | ICD-10-CM | POA: Diagnosis not present

## 2015-12-05 DIAGNOSIS — Z6826 Body mass index (BMI) 26.0-26.9, adult: Secondary | ICD-10-CM | POA: Diagnosis not present

## 2015-12-05 DIAGNOSIS — R5381 Other malaise: Secondary | ICD-10-CM | POA: Diagnosis not present

## 2015-12-05 DIAGNOSIS — J449 Chronic obstructive pulmonary disease, unspecified: Secondary | ICD-10-CM | POA: Diagnosis not present

## 2015-12-24 DIAGNOSIS — R05 Cough: Secondary | ICD-10-CM | POA: Diagnosis not present

## 2015-12-26 DIAGNOSIS — Z6826 Body mass index (BMI) 26.0-26.9, adult: Secondary | ICD-10-CM | POA: Diagnosis not present

## 2015-12-26 DIAGNOSIS — J449 Chronic obstructive pulmonary disease, unspecified: Secondary | ICD-10-CM | POA: Diagnosis not present

## 2015-12-26 DIAGNOSIS — Z89619 Acquired absence of unspecified leg above knee: Secondary | ICD-10-CM | POA: Diagnosis not present

## 2015-12-26 DIAGNOSIS — R05 Cough: Secondary | ICD-10-CM | POA: Diagnosis not present

## 2015-12-26 DIAGNOSIS — G90529 Complex regional pain syndrome I of unspecified lower limb: Secondary | ICD-10-CM | POA: Diagnosis not present

## 2015-12-26 DIAGNOSIS — M2550 Pain in unspecified joint: Secondary | ICD-10-CM | POA: Diagnosis not present

## 2016-01-27 DIAGNOSIS — F419 Anxiety disorder, unspecified: Secondary | ICD-10-CM | POA: Diagnosis not present

## 2016-01-27 DIAGNOSIS — N3000 Acute cystitis without hematuria: Secondary | ICD-10-CM | POA: Diagnosis not present

## 2016-01-27 DIAGNOSIS — I509 Heart failure, unspecified: Secondary | ICD-10-CM | POA: Diagnosis not present

## 2016-01-27 DIAGNOSIS — E7800 Pure hypercholesterolemia, unspecified: Secondary | ICD-10-CM | POA: Diagnosis not present

## 2016-01-27 DIAGNOSIS — I110 Hypertensive heart disease with heart failure: Secondary | ICD-10-CM | POA: Diagnosis not present

## 2016-01-27 DIAGNOSIS — G839 Paralytic syndrome, unspecified: Secondary | ICD-10-CM | POA: Diagnosis not present

## 2016-01-27 DIAGNOSIS — I6789 Other cerebrovascular disease: Secondary | ICD-10-CM | POA: Diagnosis not present

## 2016-01-27 DIAGNOSIS — Z79899 Other long term (current) drug therapy: Secondary | ICD-10-CM | POA: Diagnosis not present

## 2016-01-27 DIAGNOSIS — M549 Dorsalgia, unspecified: Secondary | ICD-10-CM | POA: Diagnosis not present

## 2016-01-27 DIAGNOSIS — K219 Gastro-esophageal reflux disease without esophagitis: Secondary | ICD-10-CM | POA: Diagnosis not present

## 2016-01-27 DIAGNOSIS — N390 Urinary tract infection, site not specified: Secondary | ICD-10-CM | POA: Diagnosis not present

## 2016-01-27 DIAGNOSIS — R0602 Shortness of breath: Secondary | ICD-10-CM | POA: Diagnosis not present

## 2016-01-27 DIAGNOSIS — J449 Chronic obstructive pulmonary disease, unspecified: Secondary | ICD-10-CM | POA: Diagnosis not present

## 2016-01-27 DIAGNOSIS — G4733 Obstructive sleep apnea (adult) (pediatric): Secondary | ICD-10-CM | POA: Diagnosis not present

## 2016-01-28 DIAGNOSIS — Z79899 Other long term (current) drug therapy: Secondary | ICD-10-CM | POA: Diagnosis not present

## 2016-01-28 DIAGNOSIS — E785 Hyperlipidemia, unspecified: Secondary | ICD-10-CM | POA: Diagnosis not present

## 2016-01-28 DIAGNOSIS — J449 Chronic obstructive pulmonary disease, unspecified: Secondary | ICD-10-CM | POA: Diagnosis not present

## 2016-01-28 DIAGNOSIS — R739 Hyperglycemia, unspecified: Secondary | ICD-10-CM | POA: Diagnosis not present

## 2016-01-28 DIAGNOSIS — L905 Scar conditions and fibrosis of skin: Secondary | ICD-10-CM | POA: Diagnosis not present

## 2016-01-28 DIAGNOSIS — Z6826 Body mass index (BMI) 26.0-26.9, adult: Secondary | ICD-10-CM | POA: Diagnosis not present

## 2016-01-28 DIAGNOSIS — M2550 Pain in unspecified joint: Secondary | ICD-10-CM | POA: Diagnosis not present

## 2016-01-28 DIAGNOSIS — M542 Cervicalgia: Secondary | ICD-10-CM | POA: Diagnosis not present

## 2016-02-19 DIAGNOSIS — G629 Polyneuropathy, unspecified: Secondary | ICD-10-CM | POA: Diagnosis not present

## 2016-02-19 DIAGNOSIS — J449 Chronic obstructive pulmonary disease, unspecified: Secondary | ICD-10-CM | POA: Diagnosis not present

## 2016-02-19 DIAGNOSIS — Z6828 Body mass index (BMI) 28.0-28.9, adult: Secondary | ICD-10-CM | POA: Diagnosis not present

## 2016-02-19 DIAGNOSIS — E785 Hyperlipidemia, unspecified: Secondary | ICD-10-CM | POA: Diagnosis not present

## 2016-02-19 DIAGNOSIS — M2550 Pain in unspecified joint: Secondary | ICD-10-CM | POA: Diagnosis not present

## 2016-03-22 DIAGNOSIS — M25561 Pain in right knee: Secondary | ICD-10-CM | POA: Diagnosis not present

## 2016-03-22 DIAGNOSIS — R12 Heartburn: Secondary | ICD-10-CM | POA: Diagnosis not present

## 2016-03-22 DIAGNOSIS — N390 Urinary tract infection, site not specified: Secondary | ICD-10-CM | POA: Diagnosis not present

## 2016-03-22 DIAGNOSIS — Z6828 Body mass index (BMI) 28.0-28.9, adult: Secondary | ICD-10-CM | POA: Diagnosis not present

## 2016-03-22 DIAGNOSIS — K5900 Constipation, unspecified: Secondary | ICD-10-CM | POA: Diagnosis not present

## 2016-03-22 DIAGNOSIS — R300 Dysuria: Secondary | ICD-10-CM | POA: Diagnosis not present

## 2016-03-22 DIAGNOSIS — M2550 Pain in unspecified joint: Secondary | ICD-10-CM | POA: Diagnosis not present

## 2016-03-22 DIAGNOSIS — E663 Overweight: Secondary | ICD-10-CM | POA: Diagnosis not present

## 2016-03-30 DIAGNOSIS — F17200 Nicotine dependence, unspecified, uncomplicated: Secondary | ICD-10-CM | POA: Diagnosis not present

## 2016-03-30 DIAGNOSIS — Z89619 Acquired absence of unspecified leg above knee: Secondary | ICD-10-CM | POA: Diagnosis not present

## 2016-03-30 DIAGNOSIS — Z87891 Personal history of nicotine dependence: Secondary | ICD-10-CM | POA: Diagnosis not present

## 2016-03-30 DIAGNOSIS — J449 Chronic obstructive pulmonary disease, unspecified: Secondary | ICD-10-CM | POA: Diagnosis not present

## 2016-03-30 DIAGNOSIS — R0602 Shortness of breath: Secondary | ICD-10-CM | POA: Diagnosis not present

## 2016-04-06 DIAGNOSIS — J449 Chronic obstructive pulmonary disease, unspecified: Secondary | ICD-10-CM | POA: Diagnosis not present

## 2016-04-06 DIAGNOSIS — J069 Acute upper respiratory infection, unspecified: Secondary | ICD-10-CM | POA: Diagnosis not present

## 2016-04-06 DIAGNOSIS — Z6828 Body mass index (BMI) 28.0-28.9, adult: Secondary | ICD-10-CM | POA: Diagnosis not present

## 2016-04-06 DIAGNOSIS — J029 Acute pharyngitis, unspecified: Secondary | ICD-10-CM | POA: Diagnosis not present

## 2016-04-07 DIAGNOSIS — J9621 Acute and chronic respiratory failure with hypoxia: Secondary | ICD-10-CM | POA: Diagnosis not present

## 2016-04-07 DIAGNOSIS — M810 Age-related osteoporosis without current pathological fracture: Secondary | ICD-10-CM | POA: Diagnosis present

## 2016-04-07 DIAGNOSIS — Z79899 Other long term (current) drug therapy: Secondary | ICD-10-CM | POA: Diagnosis not present

## 2016-04-07 DIAGNOSIS — J441 Chronic obstructive pulmonary disease with (acute) exacerbation: Secondary | ICD-10-CM | POA: Diagnosis present

## 2016-04-07 DIAGNOSIS — Z9981 Dependence on supplemental oxygen: Secondary | ICD-10-CM | POA: Diagnosis not present

## 2016-04-07 DIAGNOSIS — Z885 Allergy status to narcotic agent status: Secondary | ICD-10-CM | POA: Diagnosis not present

## 2016-04-07 DIAGNOSIS — D638 Anemia in other chronic diseases classified elsewhere: Secondary | ICD-10-CM | POA: Diagnosis present

## 2016-04-07 DIAGNOSIS — J189 Pneumonia, unspecified organism: Secondary | ICD-10-CM | POA: Diagnosis not present

## 2016-04-07 DIAGNOSIS — J440 Chronic obstructive pulmonary disease with acute lower respiratory infection: Secondary | ICD-10-CM | POA: Diagnosis not present

## 2016-04-07 DIAGNOSIS — Z87891 Personal history of nicotine dependence: Secondary | ICD-10-CM | POA: Diagnosis not present

## 2016-04-07 DIAGNOSIS — G9050 Complex regional pain syndrome I, unspecified: Secondary | ICD-10-CM

## 2016-04-07 DIAGNOSIS — K449 Diaphragmatic hernia without obstruction or gangrene: Secondary | ICD-10-CM | POA: Diagnosis not present

## 2016-04-07 DIAGNOSIS — I509 Heart failure, unspecified: Secondary | ICD-10-CM

## 2016-04-07 DIAGNOSIS — E875 Hyperkalemia: Secondary | ICD-10-CM | POA: Diagnosis not present

## 2016-04-07 DIAGNOSIS — J9601 Acute respiratory failure with hypoxia: Secondary | ICD-10-CM | POA: Diagnosis not present

## 2016-04-07 DIAGNOSIS — Z89612 Acquired absence of left leg above knee: Secondary | ICD-10-CM | POA: Diagnosis not present

## 2016-04-07 DIAGNOSIS — G4700 Insomnia, unspecified: Secondary | ICD-10-CM | POA: Diagnosis not present

## 2016-04-07 DIAGNOSIS — Z881 Allergy status to other antibiotic agents status: Secondary | ICD-10-CM | POA: Diagnosis not present

## 2016-04-07 DIAGNOSIS — M1990 Unspecified osteoarthritis, unspecified site: Secondary | ICD-10-CM | POA: Diagnosis present

## 2016-04-07 DIAGNOSIS — R05 Cough: Secondary | ICD-10-CM | POA: Diagnosis not present

## 2016-04-07 DIAGNOSIS — R0602 Shortness of breath: Secondary | ICD-10-CM | POA: Diagnosis not present

## 2016-04-07 DIAGNOSIS — I5032 Chronic diastolic (congestive) heart failure: Secondary | ICD-10-CM | POA: Diagnosis not present

## 2016-04-07 DIAGNOSIS — E876 Hypokalemia: Secondary | ICD-10-CM | POA: Diagnosis present

## 2016-04-07 DIAGNOSIS — E785 Hyperlipidemia, unspecified: Secondary | ICD-10-CM | POA: Diagnosis present

## 2016-04-07 DIAGNOSIS — R079 Chest pain, unspecified: Secondary | ICD-10-CM | POA: Diagnosis not present

## 2016-04-07 DIAGNOSIS — Z882 Allergy status to sulfonamides status: Secondary | ICD-10-CM | POA: Diagnosis not present

## 2016-04-07 DIAGNOSIS — K219 Gastro-esophageal reflux disease without esophagitis: Secondary | ICD-10-CM

## 2016-04-07 DIAGNOSIS — J181 Lobar pneumonia, unspecified organism: Secondary | ICD-10-CM | POA: Diagnosis not present

## 2016-04-07 DIAGNOSIS — F418 Other specified anxiety disorders: Secondary | ICD-10-CM | POA: Diagnosis present

## 2016-04-07 DIAGNOSIS — J9600 Acute respiratory failure, unspecified whether with hypoxia or hypercapnia: Secondary | ICD-10-CM | POA: Diagnosis not present

## 2016-04-07 DIAGNOSIS — Z888 Allergy status to other drugs, medicaments and biological substances status: Secondary | ICD-10-CM | POA: Diagnosis not present

## 2016-04-07 DIAGNOSIS — I69954 Hemiplegia and hemiparesis following unspecified cerebrovascular disease affecting left non-dominant side: Secondary | ICD-10-CM | POA: Diagnosis not present

## 2016-04-07 DIAGNOSIS — G4733 Obstructive sleep apnea (adult) (pediatric): Secondary | ICD-10-CM

## 2016-04-07 DIAGNOSIS — R069 Unspecified abnormalities of breathing: Secondary | ICD-10-CM | POA: Diagnosis not present

## 2016-04-07 DIAGNOSIS — J9690 Respiratory failure, unspecified, unspecified whether with hypoxia or hypercapnia: Secondary | ICD-10-CM | POA: Diagnosis not present

## 2016-04-07 DIAGNOSIS — R1310 Dysphagia, unspecified: Secondary | ICD-10-CM | POA: Diagnosis not present

## 2016-04-08 DIAGNOSIS — G4733 Obstructive sleep apnea (adult) (pediatric): Secondary | ICD-10-CM

## 2016-04-08 DIAGNOSIS — K219 Gastro-esophageal reflux disease without esophagitis: Secondary | ICD-10-CM

## 2016-04-08 DIAGNOSIS — I509 Heart failure, unspecified: Secondary | ICD-10-CM

## 2016-04-08 DIAGNOSIS — J189 Pneumonia, unspecified organism: Secondary | ICD-10-CM

## 2016-04-08 DIAGNOSIS — J9621 Acute and chronic respiratory failure with hypoxia: Secondary | ICD-10-CM

## 2016-04-08 DIAGNOSIS — J440 Chronic obstructive pulmonary disease with acute lower respiratory infection: Secondary | ICD-10-CM

## 2016-04-08 DIAGNOSIS — E875 Hyperkalemia: Secondary | ICD-10-CM

## 2016-04-08 DIAGNOSIS — G9050 Complex regional pain syndrome I, unspecified: Secondary | ICD-10-CM

## 2016-04-13 DIAGNOSIS — Z8673 Personal history of transient ischemic attack (TIA), and cerebral infarction without residual deficits: Secondary | ICD-10-CM | POA: Diagnosis not present

## 2016-04-13 DIAGNOSIS — Z9981 Dependence on supplemental oxygen: Secondary | ICD-10-CM | POA: Diagnosis not present

## 2016-04-13 DIAGNOSIS — J45909 Unspecified asthma, uncomplicated: Secondary | ICD-10-CM | POA: Diagnosis not present

## 2016-04-13 DIAGNOSIS — I509 Heart failure, unspecified: Secondary | ICD-10-CM | POA: Diagnosis not present

## 2016-04-13 DIAGNOSIS — J440 Chronic obstructive pulmonary disease with acute lower respiratory infection: Secondary | ICD-10-CM | POA: Diagnosis not present

## 2016-04-13 DIAGNOSIS — I10 Essential (primary) hypertension: Secondary | ICD-10-CM | POA: Diagnosis not present

## 2016-04-13 DIAGNOSIS — G9050 Complex regional pain syndrome I, unspecified: Secondary | ICD-10-CM | POA: Diagnosis not present

## 2016-04-13 DIAGNOSIS — G4733 Obstructive sleep apnea (adult) (pediatric): Secondary | ICD-10-CM | POA: Diagnosis not present

## 2016-04-13 DIAGNOSIS — J441 Chronic obstructive pulmonary disease with (acute) exacerbation: Secondary | ICD-10-CM | POA: Diagnosis not present

## 2016-04-13 DIAGNOSIS — Z87440 Personal history of urinary (tract) infections: Secondary | ICD-10-CM | POA: Diagnosis not present

## 2016-04-13 DIAGNOSIS — J189 Pneumonia, unspecified organism: Secondary | ICD-10-CM | POA: Diagnosis not present

## 2016-04-14 DIAGNOSIS — J449 Chronic obstructive pulmonary disease, unspecified: Secondary | ICD-10-CM | POA: Diagnosis not present

## 2016-04-14 DIAGNOSIS — R5383 Other fatigue: Secondary | ICD-10-CM | POA: Diagnosis not present

## 2016-04-14 DIAGNOSIS — G4733 Obstructive sleep apnea (adult) (pediatric): Secondary | ICD-10-CM | POA: Diagnosis not present

## 2016-04-15 DIAGNOSIS — I10 Essential (primary) hypertension: Secondary | ICD-10-CM | POA: Diagnosis not present

## 2016-04-15 DIAGNOSIS — I509 Heart failure, unspecified: Secondary | ICD-10-CM | POA: Diagnosis not present

## 2016-04-15 DIAGNOSIS — J45909 Unspecified asthma, uncomplicated: Secondary | ICD-10-CM | POA: Diagnosis not present

## 2016-04-15 DIAGNOSIS — J189 Pneumonia, unspecified organism: Secondary | ICD-10-CM | POA: Diagnosis not present

## 2016-04-15 DIAGNOSIS — J441 Chronic obstructive pulmonary disease with (acute) exacerbation: Secondary | ICD-10-CM | POA: Diagnosis not present

## 2016-04-15 DIAGNOSIS — J440 Chronic obstructive pulmonary disease with acute lower respiratory infection: Secondary | ICD-10-CM | POA: Diagnosis not present

## 2016-04-16 DIAGNOSIS — J189 Pneumonia, unspecified organism: Secondary | ICD-10-CM | POA: Diagnosis not present

## 2016-04-16 DIAGNOSIS — J45909 Unspecified asthma, uncomplicated: Secondary | ICD-10-CM | POA: Diagnosis not present

## 2016-04-16 DIAGNOSIS — I10 Essential (primary) hypertension: Secondary | ICD-10-CM | POA: Diagnosis not present

## 2016-04-16 DIAGNOSIS — J440 Chronic obstructive pulmonary disease with acute lower respiratory infection: Secondary | ICD-10-CM | POA: Diagnosis not present

## 2016-04-16 DIAGNOSIS — J441 Chronic obstructive pulmonary disease with (acute) exacerbation: Secondary | ICD-10-CM | POA: Diagnosis not present

## 2016-04-16 DIAGNOSIS — I509 Heart failure, unspecified: Secondary | ICD-10-CM | POA: Diagnosis not present

## 2016-04-19 DIAGNOSIS — I10 Essential (primary) hypertension: Secondary | ICD-10-CM | POA: Diagnosis not present

## 2016-04-19 DIAGNOSIS — Z6828 Body mass index (BMI) 28.0-28.9, adult: Secondary | ICD-10-CM | POA: Diagnosis not present

## 2016-04-19 DIAGNOSIS — Z79899 Other long term (current) drug therapy: Secondary | ICD-10-CM | POA: Diagnosis not present

## 2016-04-19 DIAGNOSIS — J440 Chronic obstructive pulmonary disease with acute lower respiratory infection: Secondary | ICD-10-CM | POA: Diagnosis not present

## 2016-04-19 DIAGNOSIS — J45909 Unspecified asthma, uncomplicated: Secondary | ICD-10-CM | POA: Diagnosis not present

## 2016-04-19 DIAGNOSIS — J441 Chronic obstructive pulmonary disease with (acute) exacerbation: Secondary | ICD-10-CM | POA: Diagnosis not present

## 2016-04-19 DIAGNOSIS — J189 Pneumonia, unspecified organism: Secondary | ICD-10-CM | POA: Diagnosis not present

## 2016-04-19 DIAGNOSIS — J449 Chronic obstructive pulmonary disease, unspecified: Secondary | ICD-10-CM | POA: Diagnosis not present

## 2016-04-19 DIAGNOSIS — I509 Heart failure, unspecified: Secondary | ICD-10-CM | POA: Diagnosis not present

## 2016-04-19 DIAGNOSIS — E559 Vitamin D deficiency, unspecified: Secondary | ICD-10-CM | POA: Diagnosis not present

## 2016-04-19 DIAGNOSIS — Z8673 Personal history of transient ischemic attack (TIA), and cerebral infarction without residual deficits: Secondary | ICD-10-CM | POA: Diagnosis not present

## 2016-04-20 DIAGNOSIS — J441 Chronic obstructive pulmonary disease with (acute) exacerbation: Secondary | ICD-10-CM | POA: Diagnosis not present

## 2016-04-20 DIAGNOSIS — J189 Pneumonia, unspecified organism: Secondary | ICD-10-CM | POA: Diagnosis not present

## 2016-04-20 DIAGNOSIS — I10 Essential (primary) hypertension: Secondary | ICD-10-CM | POA: Diagnosis not present

## 2016-04-20 DIAGNOSIS — I509 Heart failure, unspecified: Secondary | ICD-10-CM | POA: Diagnosis not present

## 2016-04-20 DIAGNOSIS — J440 Chronic obstructive pulmonary disease with acute lower respiratory infection: Secondary | ICD-10-CM | POA: Diagnosis not present

## 2016-04-20 DIAGNOSIS — J45909 Unspecified asthma, uncomplicated: Secondary | ICD-10-CM | POA: Diagnosis not present

## 2016-04-22 DIAGNOSIS — I10 Essential (primary) hypertension: Secondary | ICD-10-CM | POA: Diagnosis not present

## 2016-04-22 DIAGNOSIS — I509 Heart failure, unspecified: Secondary | ICD-10-CM | POA: Diagnosis not present

## 2016-04-22 DIAGNOSIS — M2550 Pain in unspecified joint: Secondary | ICD-10-CM | POA: Diagnosis not present

## 2016-04-22 DIAGNOSIS — J189 Pneumonia, unspecified organism: Secondary | ICD-10-CM | POA: Diagnosis not present

## 2016-04-22 DIAGNOSIS — E785 Hyperlipidemia, unspecified: Secondary | ICD-10-CM | POA: Diagnosis not present

## 2016-04-22 DIAGNOSIS — J440 Chronic obstructive pulmonary disease with acute lower respiratory infection: Secondary | ICD-10-CM | POA: Diagnosis not present

## 2016-04-22 DIAGNOSIS — J45909 Unspecified asthma, uncomplicated: Secondary | ICD-10-CM | POA: Diagnosis not present

## 2016-04-22 DIAGNOSIS — G459 Transient cerebral ischemic attack, unspecified: Secondary | ICD-10-CM | POA: Diagnosis not present

## 2016-04-22 DIAGNOSIS — Z6828 Body mass index (BMI) 28.0-28.9, adult: Secondary | ICD-10-CM | POA: Diagnosis not present

## 2016-04-22 DIAGNOSIS — F419 Anxiety disorder, unspecified: Secondary | ICD-10-CM | POA: Diagnosis not present

## 2016-04-22 DIAGNOSIS — Z79899 Other long term (current) drug therapy: Secondary | ICD-10-CM | POA: Diagnosis not present

## 2016-04-22 DIAGNOSIS — R739 Hyperglycemia, unspecified: Secondary | ICD-10-CM | POA: Diagnosis not present

## 2016-04-22 DIAGNOSIS — J449 Chronic obstructive pulmonary disease, unspecified: Secondary | ICD-10-CM | POA: Diagnosis not present

## 2016-04-22 DIAGNOSIS — J441 Chronic obstructive pulmonary disease with (acute) exacerbation: Secondary | ICD-10-CM | POA: Diagnosis not present

## 2016-04-23 DIAGNOSIS — J441 Chronic obstructive pulmonary disease with (acute) exacerbation: Secondary | ICD-10-CM | POA: Diagnosis not present

## 2016-04-23 DIAGNOSIS — J45909 Unspecified asthma, uncomplicated: Secondary | ICD-10-CM | POA: Diagnosis not present

## 2016-04-23 DIAGNOSIS — J189 Pneumonia, unspecified organism: Secondary | ICD-10-CM | POA: Diagnosis not present

## 2016-04-23 DIAGNOSIS — I509 Heart failure, unspecified: Secondary | ICD-10-CM | POA: Diagnosis not present

## 2016-04-23 DIAGNOSIS — I10 Essential (primary) hypertension: Secondary | ICD-10-CM | POA: Diagnosis not present

## 2016-04-23 DIAGNOSIS — J440 Chronic obstructive pulmonary disease with acute lower respiratory infection: Secondary | ICD-10-CM | POA: Diagnosis not present

## 2016-04-26 DIAGNOSIS — J441 Chronic obstructive pulmonary disease with (acute) exacerbation: Secondary | ICD-10-CM | POA: Diagnosis not present

## 2016-04-26 DIAGNOSIS — I509 Heart failure, unspecified: Secondary | ICD-10-CM | POA: Diagnosis not present

## 2016-04-26 DIAGNOSIS — J45909 Unspecified asthma, uncomplicated: Secondary | ICD-10-CM | POA: Diagnosis not present

## 2016-04-26 DIAGNOSIS — I10 Essential (primary) hypertension: Secondary | ICD-10-CM | POA: Diagnosis not present

## 2016-04-26 DIAGNOSIS — J189 Pneumonia, unspecified organism: Secondary | ICD-10-CM | POA: Diagnosis not present

## 2016-04-26 DIAGNOSIS — J440 Chronic obstructive pulmonary disease with acute lower respiratory infection: Secondary | ICD-10-CM | POA: Diagnosis not present

## 2016-04-27 DIAGNOSIS — J441 Chronic obstructive pulmonary disease with (acute) exacerbation: Secondary | ICD-10-CM | POA: Diagnosis not present

## 2016-04-27 DIAGNOSIS — J45909 Unspecified asthma, uncomplicated: Secondary | ICD-10-CM | POA: Diagnosis not present

## 2016-04-27 DIAGNOSIS — I509 Heart failure, unspecified: Secondary | ICD-10-CM | POA: Diagnosis not present

## 2016-04-27 DIAGNOSIS — I10 Essential (primary) hypertension: Secondary | ICD-10-CM | POA: Diagnosis not present

## 2016-04-27 DIAGNOSIS — J189 Pneumonia, unspecified organism: Secondary | ICD-10-CM | POA: Diagnosis not present

## 2016-04-27 DIAGNOSIS — J440 Chronic obstructive pulmonary disease with acute lower respiratory infection: Secondary | ICD-10-CM | POA: Diagnosis not present

## 2016-04-28 DIAGNOSIS — J449 Chronic obstructive pulmonary disease, unspecified: Secondary | ICD-10-CM | POA: Diagnosis not present

## 2016-04-28 DIAGNOSIS — G4733 Obstructive sleep apnea (adult) (pediatric): Secondary | ICD-10-CM | POA: Diagnosis not present

## 2016-04-28 DIAGNOSIS — R5383 Other fatigue: Secondary | ICD-10-CM | POA: Diagnosis not present

## 2016-04-29 DIAGNOSIS — J45909 Unspecified asthma, uncomplicated: Secondary | ICD-10-CM | POA: Diagnosis not present

## 2016-04-29 DIAGNOSIS — I509 Heart failure, unspecified: Secondary | ICD-10-CM | POA: Diagnosis not present

## 2016-04-29 DIAGNOSIS — J440 Chronic obstructive pulmonary disease with acute lower respiratory infection: Secondary | ICD-10-CM | POA: Diagnosis not present

## 2016-04-29 DIAGNOSIS — I10 Essential (primary) hypertension: Secondary | ICD-10-CM | POA: Diagnosis not present

## 2016-04-29 DIAGNOSIS — J189 Pneumonia, unspecified organism: Secondary | ICD-10-CM | POA: Diagnosis not present

## 2016-04-29 DIAGNOSIS — J441 Chronic obstructive pulmonary disease with (acute) exacerbation: Secondary | ICD-10-CM | POA: Diagnosis not present

## 2016-05-04 DIAGNOSIS — J441 Chronic obstructive pulmonary disease with (acute) exacerbation: Secondary | ICD-10-CM | POA: Diagnosis not present

## 2016-05-04 DIAGNOSIS — I10 Essential (primary) hypertension: Secondary | ICD-10-CM | POA: Diagnosis not present

## 2016-05-04 DIAGNOSIS — I509 Heart failure, unspecified: Secondary | ICD-10-CM | POA: Diagnosis not present

## 2016-05-04 DIAGNOSIS — J440 Chronic obstructive pulmonary disease with acute lower respiratory infection: Secondary | ICD-10-CM | POA: Diagnosis not present

## 2016-05-04 DIAGNOSIS — J45909 Unspecified asthma, uncomplicated: Secondary | ICD-10-CM | POA: Diagnosis not present

## 2016-05-04 DIAGNOSIS — J189 Pneumonia, unspecified organism: Secondary | ICD-10-CM | POA: Diagnosis not present

## 2016-05-06 DIAGNOSIS — J441 Chronic obstructive pulmonary disease with (acute) exacerbation: Secondary | ICD-10-CM | POA: Diagnosis not present

## 2016-05-06 DIAGNOSIS — J45909 Unspecified asthma, uncomplicated: Secondary | ICD-10-CM | POA: Diagnosis not present

## 2016-05-06 DIAGNOSIS — J189 Pneumonia, unspecified organism: Secondary | ICD-10-CM | POA: Diagnosis not present

## 2016-05-06 DIAGNOSIS — I10 Essential (primary) hypertension: Secondary | ICD-10-CM | POA: Diagnosis not present

## 2016-05-06 DIAGNOSIS — J440 Chronic obstructive pulmonary disease with acute lower respiratory infection: Secondary | ICD-10-CM | POA: Diagnosis not present

## 2016-05-06 DIAGNOSIS — I509 Heart failure, unspecified: Secondary | ICD-10-CM | POA: Diagnosis not present

## 2016-05-07 DIAGNOSIS — J45909 Unspecified asthma, uncomplicated: Secondary | ICD-10-CM | POA: Diagnosis not present

## 2016-05-07 DIAGNOSIS — J440 Chronic obstructive pulmonary disease with acute lower respiratory infection: Secondary | ICD-10-CM | POA: Diagnosis not present

## 2016-05-07 DIAGNOSIS — J441 Chronic obstructive pulmonary disease with (acute) exacerbation: Secondary | ICD-10-CM | POA: Diagnosis not present

## 2016-05-07 DIAGNOSIS — J449 Chronic obstructive pulmonary disease, unspecified: Secondary | ICD-10-CM | POA: Diagnosis not present

## 2016-05-07 DIAGNOSIS — I10 Essential (primary) hypertension: Secondary | ICD-10-CM | POA: Diagnosis not present

## 2016-05-07 DIAGNOSIS — J189 Pneumonia, unspecified organism: Secondary | ICD-10-CM | POA: Diagnosis not present

## 2016-05-07 DIAGNOSIS — G4733 Obstructive sleep apnea (adult) (pediatric): Secondary | ICD-10-CM | POA: Diagnosis not present

## 2016-05-07 DIAGNOSIS — I509 Heart failure, unspecified: Secondary | ICD-10-CM | POA: Diagnosis not present

## 2016-05-07 DIAGNOSIS — R5383 Other fatigue: Secondary | ICD-10-CM | POA: Diagnosis not present

## 2016-05-24 DIAGNOSIS — R739 Hyperglycemia, unspecified: Secondary | ICD-10-CM | POA: Diagnosis not present

## 2016-05-24 DIAGNOSIS — Z23 Encounter for immunization: Secondary | ICD-10-CM | POA: Diagnosis not present

## 2016-05-24 DIAGNOSIS — R3915 Urgency of urination: Secondary | ICD-10-CM | POA: Diagnosis not present

## 2016-05-24 DIAGNOSIS — E785 Hyperlipidemia, unspecified: Secondary | ICD-10-CM | POA: Diagnosis not present

## 2016-05-24 DIAGNOSIS — M2550 Pain in unspecified joint: Secondary | ICD-10-CM | POA: Diagnosis not present

## 2016-05-24 DIAGNOSIS — Z6829 Body mass index (BMI) 29.0-29.9, adult: Secondary | ICD-10-CM | POA: Diagnosis not present

## 2016-05-25 DIAGNOSIS — R1314 Dysphagia, pharyngoesophageal phase: Secondary | ICD-10-CM | POA: Diagnosis not present

## 2016-05-25 DIAGNOSIS — G4733 Obstructive sleep apnea (adult) (pediatric): Secondary | ICD-10-CM | POA: Diagnosis not present

## 2016-05-25 DIAGNOSIS — K210 Gastro-esophageal reflux disease with esophagitis: Secondary | ICD-10-CM | POA: Diagnosis not present

## 2016-05-25 DIAGNOSIS — J449 Chronic obstructive pulmonary disease, unspecified: Secondary | ICD-10-CM | POA: Diagnosis not present

## 2016-05-26 DIAGNOSIS — I10 Essential (primary) hypertension: Secondary | ICD-10-CM | POA: Diagnosis not present

## 2016-05-26 DIAGNOSIS — J189 Pneumonia, unspecified organism: Secondary | ICD-10-CM | POA: Diagnosis not present

## 2016-05-26 DIAGNOSIS — J440 Chronic obstructive pulmonary disease with acute lower respiratory infection: Secondary | ICD-10-CM | POA: Diagnosis not present

## 2016-05-26 DIAGNOSIS — J45909 Unspecified asthma, uncomplicated: Secondary | ICD-10-CM | POA: Diagnosis not present

## 2016-05-26 DIAGNOSIS — J441 Chronic obstructive pulmonary disease with (acute) exacerbation: Secondary | ICD-10-CM | POA: Diagnosis not present

## 2016-05-26 DIAGNOSIS — I509 Heart failure, unspecified: Secondary | ICD-10-CM | POA: Diagnosis not present

## 2016-05-27 DIAGNOSIS — I509 Heart failure, unspecified: Secondary | ICD-10-CM | POA: Diagnosis not present

## 2016-05-27 DIAGNOSIS — J440 Chronic obstructive pulmonary disease with acute lower respiratory infection: Secondary | ICD-10-CM | POA: Diagnosis not present

## 2016-05-27 DIAGNOSIS — J189 Pneumonia, unspecified organism: Secondary | ICD-10-CM | POA: Diagnosis not present

## 2016-05-27 DIAGNOSIS — J441 Chronic obstructive pulmonary disease with (acute) exacerbation: Secondary | ICD-10-CM | POA: Diagnosis not present

## 2016-05-27 DIAGNOSIS — I10 Essential (primary) hypertension: Secondary | ICD-10-CM | POA: Diagnosis not present

## 2016-05-27 DIAGNOSIS — J45909 Unspecified asthma, uncomplicated: Secondary | ICD-10-CM | POA: Diagnosis not present

## 2016-05-31 DIAGNOSIS — J189 Pneumonia, unspecified organism: Secondary | ICD-10-CM | POA: Diagnosis not present

## 2016-05-31 DIAGNOSIS — I10 Essential (primary) hypertension: Secondary | ICD-10-CM | POA: Diagnosis not present

## 2016-05-31 DIAGNOSIS — I509 Heart failure, unspecified: Secondary | ICD-10-CM | POA: Diagnosis not present

## 2016-05-31 DIAGNOSIS — J440 Chronic obstructive pulmonary disease with acute lower respiratory infection: Secondary | ICD-10-CM | POA: Diagnosis not present

## 2016-05-31 DIAGNOSIS — J441 Chronic obstructive pulmonary disease with (acute) exacerbation: Secondary | ICD-10-CM | POA: Diagnosis not present

## 2016-05-31 DIAGNOSIS — J45909 Unspecified asthma, uncomplicated: Secondary | ICD-10-CM | POA: Diagnosis not present

## 2016-06-01 DIAGNOSIS — I10 Essential (primary) hypertension: Secondary | ICD-10-CM | POA: Diagnosis not present

## 2016-06-01 DIAGNOSIS — S199XXA Unspecified injury of neck, initial encounter: Secondary | ICD-10-CM | POA: Diagnosis not present

## 2016-06-01 DIAGNOSIS — J189 Pneumonia, unspecified organism: Secondary | ICD-10-CM | POA: Diagnosis not present

## 2016-06-01 DIAGNOSIS — J441 Chronic obstructive pulmonary disease with (acute) exacerbation: Secondary | ICD-10-CM | POA: Diagnosis not present

## 2016-06-01 DIAGNOSIS — J449 Chronic obstructive pulmonary disease, unspecified: Secondary | ICD-10-CM | POA: Diagnosis not present

## 2016-06-01 DIAGNOSIS — J440 Chronic obstructive pulmonary disease with acute lower respiratory infection: Secondary | ICD-10-CM | POA: Diagnosis not present

## 2016-06-01 DIAGNOSIS — R0602 Shortness of breath: Secondary | ICD-10-CM | POA: Diagnosis not present

## 2016-06-01 DIAGNOSIS — R069 Unspecified abnormalities of breathing: Secondary | ICD-10-CM | POA: Diagnosis not present

## 2016-06-01 DIAGNOSIS — J45909 Unspecified asthma, uncomplicated: Secondary | ICD-10-CM | POA: Diagnosis not present

## 2016-06-01 DIAGNOSIS — M542 Cervicalgia: Secondary | ICD-10-CM | POA: Diagnosis not present

## 2016-06-01 DIAGNOSIS — N390 Urinary tract infection, site not specified: Secondary | ICD-10-CM | POA: Diagnosis not present

## 2016-06-01 DIAGNOSIS — I509 Heart failure, unspecified: Secondary | ICD-10-CM | POA: Diagnosis not present

## 2016-06-02 DIAGNOSIS — R8271 Bacteriuria: Secondary | ICD-10-CM | POA: Diagnosis present

## 2016-06-02 DIAGNOSIS — J189 Pneumonia, unspecified organism: Secondary | ICD-10-CM | POA: Diagnosis not present

## 2016-06-02 DIAGNOSIS — J9600 Acute respiratory failure, unspecified whether with hypoxia or hypercapnia: Secondary | ICD-10-CM | POA: Diagnosis not present

## 2016-06-02 DIAGNOSIS — M542 Cervicalgia: Secondary | ICD-10-CM | POA: Diagnosis not present

## 2016-06-02 DIAGNOSIS — R5383 Other fatigue: Secondary | ICD-10-CM | POA: Diagnosis not present

## 2016-06-02 DIAGNOSIS — G4733 Obstructive sleep apnea (adult) (pediatric): Secondary | ICD-10-CM | POA: Diagnosis not present

## 2016-06-02 DIAGNOSIS — J449 Chronic obstructive pulmonary disease, unspecified: Secondary | ICD-10-CM | POA: Diagnosis not present

## 2016-06-02 DIAGNOSIS — J9690 Respiratory failure, unspecified, unspecified whether with hypoxia or hypercapnia: Secondary | ICD-10-CM | POA: Diagnosis not present

## 2016-06-02 DIAGNOSIS — N390 Urinary tract infection, site not specified: Secondary | ICD-10-CM | POA: Diagnosis not present

## 2016-06-02 DIAGNOSIS — Z7952 Long term (current) use of systemic steroids: Secondary | ICD-10-CM | POA: Diagnosis not present

## 2016-06-02 DIAGNOSIS — K219 Gastro-esophageal reflux disease without esophagitis: Secondary | ICD-10-CM | POA: Diagnosis not present

## 2016-06-02 DIAGNOSIS — R0602 Shortness of breath: Secondary | ICD-10-CM | POA: Diagnosis not present

## 2016-06-02 DIAGNOSIS — J441 Chronic obstructive pulmonary disease with (acute) exacerbation: Secondary | ICD-10-CM | POA: Diagnosis not present

## 2016-06-02 DIAGNOSIS — E785 Hyperlipidemia, unspecified: Secondary | ICD-10-CM | POA: Diagnosis not present

## 2016-06-02 DIAGNOSIS — Z79899 Other long term (current) drug therapy: Secondary | ICD-10-CM | POA: Diagnosis not present

## 2016-06-02 DIAGNOSIS — I10 Essential (primary) hypertension: Secondary | ICD-10-CM | POA: Diagnosis not present

## 2016-06-02 DIAGNOSIS — J9621 Acute and chronic respiratory failure with hypoxia: Secondary | ICD-10-CM | POA: Diagnosis not present

## 2016-06-02 DIAGNOSIS — R05 Cough: Secondary | ICD-10-CM | POA: Diagnosis not present

## 2016-06-02 DIAGNOSIS — Z8673 Personal history of transient ischemic attack (TIA), and cerebral infarction without residual deficits: Secondary | ICD-10-CM | POA: Diagnosis not present

## 2016-06-02 DIAGNOSIS — S199XXA Unspecified injury of neck, initial encounter: Secondary | ICD-10-CM | POA: Diagnosis not present

## 2016-06-10 DIAGNOSIS — J440 Chronic obstructive pulmonary disease with acute lower respiratory infection: Secondary | ICD-10-CM | POA: Diagnosis not present

## 2016-06-10 DIAGNOSIS — J45909 Unspecified asthma, uncomplicated: Secondary | ICD-10-CM | POA: Diagnosis not present

## 2016-06-10 DIAGNOSIS — J189 Pneumonia, unspecified organism: Secondary | ICD-10-CM | POA: Diagnosis not present

## 2016-06-10 DIAGNOSIS — I10 Essential (primary) hypertension: Secondary | ICD-10-CM | POA: Diagnosis not present

## 2016-06-10 DIAGNOSIS — J441 Chronic obstructive pulmonary disease with (acute) exacerbation: Secondary | ICD-10-CM | POA: Diagnosis not present

## 2016-06-10 DIAGNOSIS — I509 Heart failure, unspecified: Secondary | ICD-10-CM | POA: Diagnosis not present

## 2016-06-12 DIAGNOSIS — J440 Chronic obstructive pulmonary disease with acute lower respiratory infection: Secondary | ICD-10-CM | POA: Diagnosis not present

## 2016-06-12 DIAGNOSIS — J441 Chronic obstructive pulmonary disease with (acute) exacerbation: Secondary | ICD-10-CM | POA: Diagnosis not present

## 2016-06-12 DIAGNOSIS — I509 Heart failure, unspecified: Secondary | ICD-10-CM | POA: Diagnosis not present

## 2016-06-12 DIAGNOSIS — Z87440 Personal history of urinary (tract) infections: Secondary | ICD-10-CM | POA: Diagnosis not present

## 2016-06-12 DIAGNOSIS — G4733 Obstructive sleep apnea (adult) (pediatric): Secondary | ICD-10-CM | POA: Diagnosis not present

## 2016-06-12 DIAGNOSIS — G9050 Complex regional pain syndrome I, unspecified: Secondary | ICD-10-CM | POA: Diagnosis not present

## 2016-06-12 DIAGNOSIS — Z9981 Dependence on supplemental oxygen: Secondary | ICD-10-CM | POA: Diagnosis not present

## 2016-06-12 DIAGNOSIS — Z8673 Personal history of transient ischemic attack (TIA), and cerebral infarction without residual deficits: Secondary | ICD-10-CM | POA: Diagnosis not present

## 2016-06-12 DIAGNOSIS — J45909 Unspecified asthma, uncomplicated: Secondary | ICD-10-CM | POA: Diagnosis not present

## 2016-06-12 DIAGNOSIS — J189 Pneumonia, unspecified organism: Secondary | ICD-10-CM | POA: Diagnosis not present

## 2016-06-12 DIAGNOSIS — I10 Essential (primary) hypertension: Secondary | ICD-10-CM | POA: Diagnosis not present

## 2016-06-14 DIAGNOSIS — I10 Essential (primary) hypertension: Secondary | ICD-10-CM | POA: Diagnosis not present

## 2016-06-14 DIAGNOSIS — J45909 Unspecified asthma, uncomplicated: Secondary | ICD-10-CM | POA: Diagnosis not present

## 2016-06-14 DIAGNOSIS — J189 Pneumonia, unspecified organism: Secondary | ICD-10-CM | POA: Diagnosis not present

## 2016-06-14 DIAGNOSIS — J441 Chronic obstructive pulmonary disease with (acute) exacerbation: Secondary | ICD-10-CM | POA: Diagnosis not present

## 2016-06-14 DIAGNOSIS — J440 Chronic obstructive pulmonary disease with acute lower respiratory infection: Secondary | ICD-10-CM | POA: Diagnosis not present

## 2016-06-14 DIAGNOSIS — I509 Heart failure, unspecified: Secondary | ICD-10-CM | POA: Diagnosis not present

## 2016-06-16 DIAGNOSIS — J189 Pneumonia, unspecified organism: Secondary | ICD-10-CM | POA: Diagnosis not present

## 2016-06-16 DIAGNOSIS — J441 Chronic obstructive pulmonary disease with (acute) exacerbation: Secondary | ICD-10-CM | POA: Diagnosis not present

## 2016-06-16 DIAGNOSIS — J45909 Unspecified asthma, uncomplicated: Secondary | ICD-10-CM | POA: Diagnosis not present

## 2016-06-16 DIAGNOSIS — J440 Chronic obstructive pulmonary disease with acute lower respiratory infection: Secondary | ICD-10-CM | POA: Diagnosis not present

## 2016-06-16 DIAGNOSIS — I10 Essential (primary) hypertension: Secondary | ICD-10-CM | POA: Diagnosis not present

## 2016-06-16 DIAGNOSIS — I509 Heart failure, unspecified: Secondary | ICD-10-CM | POA: Diagnosis not present

## 2016-06-18 DIAGNOSIS — J449 Chronic obstructive pulmonary disease, unspecified: Secondary | ICD-10-CM | POA: Diagnosis not present

## 2016-06-18 DIAGNOSIS — R5383 Other fatigue: Secondary | ICD-10-CM | POA: Diagnosis not present

## 2016-06-18 DIAGNOSIS — G4733 Obstructive sleep apnea (adult) (pediatric): Secondary | ICD-10-CM | POA: Diagnosis not present

## 2016-06-22 DIAGNOSIS — J440 Chronic obstructive pulmonary disease with acute lower respiratory infection: Secondary | ICD-10-CM | POA: Diagnosis not present

## 2016-06-22 DIAGNOSIS — I10 Essential (primary) hypertension: Secondary | ICD-10-CM | POA: Diagnosis not present

## 2016-06-22 DIAGNOSIS — J441 Chronic obstructive pulmonary disease with (acute) exacerbation: Secondary | ICD-10-CM | POA: Diagnosis not present

## 2016-06-22 DIAGNOSIS — J45909 Unspecified asthma, uncomplicated: Secondary | ICD-10-CM | POA: Diagnosis not present

## 2016-06-22 DIAGNOSIS — I509 Heart failure, unspecified: Secondary | ICD-10-CM | POA: Diagnosis not present

## 2016-06-22 DIAGNOSIS — J189 Pneumonia, unspecified organism: Secondary | ICD-10-CM | POA: Diagnosis not present

## 2016-06-23 DIAGNOSIS — Z6829 Body mass index (BMI) 29.0-29.9, adult: Secondary | ICD-10-CM | POA: Diagnosis not present

## 2016-06-23 DIAGNOSIS — J189 Pneumonia, unspecified organism: Secondary | ICD-10-CM | POA: Diagnosis not present

## 2016-06-23 DIAGNOSIS — B370 Candidal stomatitis: Secondary | ICD-10-CM | POA: Diagnosis not present

## 2016-06-23 DIAGNOSIS — M2550 Pain in unspecified joint: Secondary | ICD-10-CM | POA: Diagnosis not present

## 2016-06-23 DIAGNOSIS — Z79899 Other long term (current) drug therapy: Secondary | ICD-10-CM | POA: Diagnosis not present

## 2016-06-23 DIAGNOSIS — J4540 Moderate persistent asthma, uncomplicated: Secondary | ICD-10-CM | POA: Diagnosis not present

## 2016-06-23 DIAGNOSIS — I10 Essential (primary) hypertension: Secondary | ICD-10-CM | POA: Diagnosis not present

## 2016-06-25 DIAGNOSIS — J45909 Unspecified asthma, uncomplicated: Secondary | ICD-10-CM | POA: Diagnosis not present

## 2016-06-25 DIAGNOSIS — J189 Pneumonia, unspecified organism: Secondary | ICD-10-CM | POA: Diagnosis not present

## 2016-06-25 DIAGNOSIS — J440 Chronic obstructive pulmonary disease with acute lower respiratory infection: Secondary | ICD-10-CM | POA: Diagnosis not present

## 2016-06-25 DIAGNOSIS — I509 Heart failure, unspecified: Secondary | ICD-10-CM | POA: Diagnosis not present

## 2016-06-25 DIAGNOSIS — I10 Essential (primary) hypertension: Secondary | ICD-10-CM | POA: Diagnosis not present

## 2016-06-25 DIAGNOSIS — J441 Chronic obstructive pulmonary disease with (acute) exacerbation: Secondary | ICD-10-CM | POA: Diagnosis not present

## 2016-06-28 DIAGNOSIS — J189 Pneumonia, unspecified organism: Secondary | ICD-10-CM | POA: Diagnosis not present

## 2016-06-28 DIAGNOSIS — J440 Chronic obstructive pulmonary disease with acute lower respiratory infection: Secondary | ICD-10-CM | POA: Diagnosis not present

## 2016-06-28 DIAGNOSIS — I10 Essential (primary) hypertension: Secondary | ICD-10-CM | POA: Diagnosis not present

## 2016-06-28 DIAGNOSIS — J45909 Unspecified asthma, uncomplicated: Secondary | ICD-10-CM | POA: Diagnosis not present

## 2016-06-28 DIAGNOSIS — I509 Heart failure, unspecified: Secondary | ICD-10-CM | POA: Diagnosis not present

## 2016-06-28 DIAGNOSIS — J441 Chronic obstructive pulmonary disease with (acute) exacerbation: Secondary | ICD-10-CM | POA: Diagnosis not present

## 2016-06-29 DIAGNOSIS — J189 Pneumonia, unspecified organism: Secondary | ICD-10-CM | POA: Diagnosis not present

## 2016-06-29 DIAGNOSIS — R5383 Other fatigue: Secondary | ICD-10-CM | POA: Diagnosis not present

## 2016-06-29 DIAGNOSIS — J441 Chronic obstructive pulmonary disease with (acute) exacerbation: Secondary | ICD-10-CM | POA: Diagnosis not present

## 2016-06-29 DIAGNOSIS — J440 Chronic obstructive pulmonary disease with acute lower respiratory infection: Secondary | ICD-10-CM | POA: Diagnosis not present

## 2016-06-29 DIAGNOSIS — G4733 Obstructive sleep apnea (adult) (pediatric): Secondary | ICD-10-CM | POA: Diagnosis not present

## 2016-06-29 DIAGNOSIS — J45909 Unspecified asthma, uncomplicated: Secondary | ICD-10-CM | POA: Diagnosis not present

## 2016-06-29 DIAGNOSIS — I509 Heart failure, unspecified: Secondary | ICD-10-CM | POA: Diagnosis not present

## 2016-06-29 DIAGNOSIS — J4551 Severe persistent asthma with (acute) exacerbation: Secondary | ICD-10-CM | POA: Diagnosis not present

## 2016-06-29 DIAGNOSIS — I10 Essential (primary) hypertension: Secondary | ICD-10-CM | POA: Diagnosis not present

## 2016-06-30 DIAGNOSIS — J45909 Unspecified asthma, uncomplicated: Secondary | ICD-10-CM | POA: Diagnosis not present

## 2016-06-30 DIAGNOSIS — J189 Pneumonia, unspecified organism: Secondary | ICD-10-CM | POA: Diagnosis not present

## 2016-06-30 DIAGNOSIS — J441 Chronic obstructive pulmonary disease with (acute) exacerbation: Secondary | ICD-10-CM | POA: Diagnosis not present

## 2016-06-30 DIAGNOSIS — I10 Essential (primary) hypertension: Secondary | ICD-10-CM | POA: Diagnosis not present

## 2016-06-30 DIAGNOSIS — I509 Heart failure, unspecified: Secondary | ICD-10-CM | POA: Diagnosis not present

## 2016-06-30 DIAGNOSIS — J440 Chronic obstructive pulmonary disease with acute lower respiratory infection: Secondary | ICD-10-CM | POA: Diagnosis not present

## 2016-07-01 DIAGNOSIS — I509 Heart failure, unspecified: Secondary | ICD-10-CM | POA: Diagnosis not present

## 2016-07-01 DIAGNOSIS — J440 Chronic obstructive pulmonary disease with acute lower respiratory infection: Secondary | ICD-10-CM | POA: Diagnosis not present

## 2016-07-01 DIAGNOSIS — I10 Essential (primary) hypertension: Secondary | ICD-10-CM | POA: Diagnosis not present

## 2016-07-01 DIAGNOSIS — J45909 Unspecified asthma, uncomplicated: Secondary | ICD-10-CM | POA: Diagnosis not present

## 2016-07-01 DIAGNOSIS — J441 Chronic obstructive pulmonary disease with (acute) exacerbation: Secondary | ICD-10-CM | POA: Diagnosis not present

## 2016-07-01 DIAGNOSIS — J189 Pneumonia, unspecified organism: Secondary | ICD-10-CM | POA: Diagnosis not present

## 2016-07-03 DIAGNOSIS — G4733 Obstructive sleep apnea (adult) (pediatric): Secondary | ICD-10-CM | POA: Diagnosis not present

## 2016-07-06 DIAGNOSIS — J440 Chronic obstructive pulmonary disease with acute lower respiratory infection: Secondary | ICD-10-CM | POA: Diagnosis not present

## 2016-07-06 DIAGNOSIS — I10 Essential (primary) hypertension: Secondary | ICD-10-CM | POA: Diagnosis not present

## 2016-07-06 DIAGNOSIS — J45909 Unspecified asthma, uncomplicated: Secondary | ICD-10-CM | POA: Diagnosis not present

## 2016-07-06 DIAGNOSIS — I509 Heart failure, unspecified: Secondary | ICD-10-CM | POA: Diagnosis not present

## 2016-07-06 DIAGNOSIS — J441 Chronic obstructive pulmonary disease with (acute) exacerbation: Secondary | ICD-10-CM | POA: Diagnosis not present

## 2016-07-06 DIAGNOSIS — J189 Pneumonia, unspecified organism: Secondary | ICD-10-CM | POA: Diagnosis not present

## 2016-07-12 DIAGNOSIS — I509 Heart failure, unspecified: Secondary | ICD-10-CM | POA: Diagnosis not present

## 2016-07-12 DIAGNOSIS — J45909 Unspecified asthma, uncomplicated: Secondary | ICD-10-CM | POA: Diagnosis not present

## 2016-07-12 DIAGNOSIS — J441 Chronic obstructive pulmonary disease with (acute) exacerbation: Secondary | ICD-10-CM | POA: Diagnosis not present

## 2016-07-12 DIAGNOSIS — I10 Essential (primary) hypertension: Secondary | ICD-10-CM | POA: Diagnosis not present

## 2016-07-12 DIAGNOSIS — J440 Chronic obstructive pulmonary disease with acute lower respiratory infection: Secondary | ICD-10-CM | POA: Diagnosis not present

## 2016-07-12 DIAGNOSIS — J189 Pneumonia, unspecified organism: Secondary | ICD-10-CM | POA: Diagnosis not present

## 2016-07-13 DIAGNOSIS — J4540 Moderate persistent asthma, uncomplicated: Secondary | ICD-10-CM | POA: Diagnosis not present

## 2016-07-13 DIAGNOSIS — G4733 Obstructive sleep apnea (adult) (pediatric): Secondary | ICD-10-CM | POA: Diagnosis not present

## 2016-07-13 DIAGNOSIS — J441 Chronic obstructive pulmonary disease with (acute) exacerbation: Secondary | ICD-10-CM | POA: Diagnosis not present

## 2016-07-13 DIAGNOSIS — I509 Heart failure, unspecified: Secondary | ICD-10-CM | POA: Diagnosis not present

## 2016-07-13 DIAGNOSIS — J440 Chronic obstructive pulmonary disease with acute lower respiratory infection: Secondary | ICD-10-CM | POA: Diagnosis not present

## 2016-07-13 DIAGNOSIS — I10 Essential (primary) hypertension: Secondary | ICD-10-CM | POA: Diagnosis not present

## 2016-07-13 DIAGNOSIS — J189 Pneumonia, unspecified organism: Secondary | ICD-10-CM | POA: Diagnosis not present

## 2016-07-13 DIAGNOSIS — R5383 Other fatigue: Secondary | ICD-10-CM | POA: Diagnosis not present

## 2016-07-13 DIAGNOSIS — J45909 Unspecified asthma, uncomplicated: Secondary | ICD-10-CM | POA: Diagnosis not present

## 2016-07-15 DIAGNOSIS — I509 Heart failure, unspecified: Secondary | ICD-10-CM | POA: Diagnosis not present

## 2016-07-15 DIAGNOSIS — J45909 Unspecified asthma, uncomplicated: Secondary | ICD-10-CM | POA: Diagnosis not present

## 2016-07-15 DIAGNOSIS — J440 Chronic obstructive pulmonary disease with acute lower respiratory infection: Secondary | ICD-10-CM | POA: Diagnosis not present

## 2016-07-15 DIAGNOSIS — I10 Essential (primary) hypertension: Secondary | ICD-10-CM | POA: Diagnosis not present

## 2016-07-15 DIAGNOSIS — J189 Pneumonia, unspecified organism: Secondary | ICD-10-CM | POA: Diagnosis not present

## 2016-07-15 DIAGNOSIS — J441 Chronic obstructive pulmonary disease with (acute) exacerbation: Secondary | ICD-10-CM | POA: Diagnosis not present

## 2016-07-19 DIAGNOSIS — J440 Chronic obstructive pulmonary disease with acute lower respiratory infection: Secondary | ICD-10-CM | POA: Diagnosis not present

## 2016-07-19 DIAGNOSIS — J441 Chronic obstructive pulmonary disease with (acute) exacerbation: Secondary | ICD-10-CM | POA: Diagnosis not present

## 2016-07-19 DIAGNOSIS — J45909 Unspecified asthma, uncomplicated: Secondary | ICD-10-CM | POA: Diagnosis not present

## 2016-07-19 DIAGNOSIS — I10 Essential (primary) hypertension: Secondary | ICD-10-CM | POA: Diagnosis not present

## 2016-07-19 DIAGNOSIS — I509 Heart failure, unspecified: Secondary | ICD-10-CM | POA: Diagnosis not present

## 2016-07-19 DIAGNOSIS — J189 Pneumonia, unspecified organism: Secondary | ICD-10-CM | POA: Diagnosis not present

## 2016-07-20 DIAGNOSIS — J4540 Moderate persistent asthma, uncomplicated: Secondary | ICD-10-CM | POA: Diagnosis not present

## 2016-07-21 DIAGNOSIS — J441 Chronic obstructive pulmonary disease with (acute) exacerbation: Secondary | ICD-10-CM | POA: Diagnosis not present

## 2016-07-21 DIAGNOSIS — J45909 Unspecified asthma, uncomplicated: Secondary | ICD-10-CM | POA: Diagnosis not present

## 2016-07-21 DIAGNOSIS — I509 Heart failure, unspecified: Secondary | ICD-10-CM | POA: Diagnosis not present

## 2016-07-21 DIAGNOSIS — J440 Chronic obstructive pulmonary disease with acute lower respiratory infection: Secondary | ICD-10-CM | POA: Diagnosis not present

## 2016-07-21 DIAGNOSIS — J189 Pneumonia, unspecified organism: Secondary | ICD-10-CM | POA: Diagnosis not present

## 2016-07-21 DIAGNOSIS — I10 Essential (primary) hypertension: Secondary | ICD-10-CM | POA: Diagnosis not present

## 2016-07-23 DIAGNOSIS — K219 Gastro-esophageal reflux disease without esophagitis: Secondary | ICD-10-CM | POA: Diagnosis present

## 2016-07-23 DIAGNOSIS — Z87891 Personal history of nicotine dependence: Secondary | ICD-10-CM | POA: Diagnosis not present

## 2016-07-23 DIAGNOSIS — Z888 Allergy status to other drugs, medicaments and biological substances status: Secondary | ICD-10-CM | POA: Diagnosis not present

## 2016-07-23 DIAGNOSIS — I110 Hypertensive heart disease with heart failure: Secondary | ICD-10-CM | POA: Diagnosis not present

## 2016-07-23 DIAGNOSIS — Z79899 Other long term (current) drug therapy: Secondary | ICD-10-CM | POA: Diagnosis not present

## 2016-07-23 DIAGNOSIS — F418 Other specified anxiety disorders: Secondary | ICD-10-CM | POA: Diagnosis present

## 2016-07-23 DIAGNOSIS — I959 Hypotension, unspecified: Secondary | ICD-10-CM | POA: Diagnosis present

## 2016-07-23 DIAGNOSIS — G43401 Hemiplegic migraine, not intractable, with status migrainosus: Secondary | ICD-10-CM

## 2016-07-23 DIAGNOSIS — Z89612 Acquired absence of left leg above knee: Secondary | ICD-10-CM | POA: Diagnosis not present

## 2016-07-23 DIAGNOSIS — M1990 Unspecified osteoarthritis, unspecified site: Secondary | ICD-10-CM | POA: Diagnosis present

## 2016-07-23 DIAGNOSIS — G9050 Complex regional pain syndrome I, unspecified: Secondary | ICD-10-CM | POA: Diagnosis present

## 2016-07-23 DIAGNOSIS — R531 Weakness: Secondary | ICD-10-CM | POA: Diagnosis not present

## 2016-07-23 DIAGNOSIS — E785 Hyperlipidemia, unspecified: Secondary | ICD-10-CM | POA: Diagnosis present

## 2016-07-23 DIAGNOSIS — Z9981 Dependence on supplemental oxygen: Secondary | ICD-10-CM | POA: Diagnosis not present

## 2016-07-23 DIAGNOSIS — J9600 Acute respiratory failure, unspecified whether with hypoxia or hypercapnia: Secondary | ICD-10-CM

## 2016-07-23 DIAGNOSIS — J9622 Acute and chronic respiratory failure with hypercapnia: Secondary | ICD-10-CM | POA: Diagnosis not present

## 2016-07-23 DIAGNOSIS — I69954 Hemiplegia and hemiparesis following unspecified cerebrovascular disease affecting left non-dominant side: Secondary | ICD-10-CM | POA: Diagnosis not present

## 2016-07-23 DIAGNOSIS — R4182 Altered mental status, unspecified: Secondary | ICD-10-CM | POA: Diagnosis not present

## 2016-07-23 DIAGNOSIS — Z882 Allergy status to sulfonamides status: Secondary | ICD-10-CM | POA: Diagnosis not present

## 2016-07-23 DIAGNOSIS — I5032 Chronic diastolic (congestive) heart failure: Secondary | ICD-10-CM | POA: Diagnosis not present

## 2016-07-23 DIAGNOSIS — Z91040 Latex allergy status: Secondary | ICD-10-CM | POA: Diagnosis not present

## 2016-07-23 DIAGNOSIS — R740 Nonspecific elevation of levels of transaminase and lactic acid dehydrogenase [LDH]: Secondary | ICD-10-CM | POA: Diagnosis present

## 2016-07-23 DIAGNOSIS — J9621 Acute and chronic respiratory failure with hypoxia: Secondary | ICD-10-CM | POA: Diagnosis not present

## 2016-07-23 DIAGNOSIS — G9340 Encephalopathy, unspecified: Secondary | ICD-10-CM | POA: Diagnosis not present

## 2016-07-23 DIAGNOSIS — Z885 Allergy status to narcotic agent status: Secondary | ICD-10-CM | POA: Diagnosis not present

## 2016-07-23 DIAGNOSIS — G4733 Obstructive sleep apnea (adult) (pediatric): Secondary | ICD-10-CM | POA: Diagnosis present

## 2016-07-23 DIAGNOSIS — J449 Chronic obstructive pulmonary disease, unspecified: Secondary | ICD-10-CM | POA: Diagnosis present

## 2016-07-26 DIAGNOSIS — I10 Essential (primary) hypertension: Secondary | ICD-10-CM | POA: Diagnosis not present

## 2016-07-26 DIAGNOSIS — J45909 Unspecified asthma, uncomplicated: Secondary | ICD-10-CM | POA: Diagnosis not present

## 2016-07-26 DIAGNOSIS — J189 Pneumonia, unspecified organism: Secondary | ICD-10-CM | POA: Diagnosis not present

## 2016-07-26 DIAGNOSIS — J440 Chronic obstructive pulmonary disease with acute lower respiratory infection: Secondary | ICD-10-CM | POA: Diagnosis not present

## 2016-07-26 DIAGNOSIS — I509 Heart failure, unspecified: Secondary | ICD-10-CM | POA: Diagnosis not present

## 2016-07-26 DIAGNOSIS — J441 Chronic obstructive pulmonary disease with (acute) exacerbation: Secondary | ICD-10-CM | POA: Diagnosis not present

## 2016-07-27 DIAGNOSIS — I10 Essential (primary) hypertension: Secondary | ICD-10-CM | POA: Diagnosis not present

## 2016-07-27 DIAGNOSIS — J440 Chronic obstructive pulmonary disease with acute lower respiratory infection: Secondary | ICD-10-CM | POA: Diagnosis not present

## 2016-07-27 DIAGNOSIS — J441 Chronic obstructive pulmonary disease with (acute) exacerbation: Secondary | ICD-10-CM | POA: Diagnosis not present

## 2016-07-27 DIAGNOSIS — J189 Pneumonia, unspecified organism: Secondary | ICD-10-CM | POA: Diagnosis not present

## 2016-07-27 DIAGNOSIS — I509 Heart failure, unspecified: Secondary | ICD-10-CM | POA: Diagnosis not present

## 2016-07-27 DIAGNOSIS — J45909 Unspecified asthma, uncomplicated: Secondary | ICD-10-CM | POA: Diagnosis not present

## 2016-07-30 DIAGNOSIS — J441 Chronic obstructive pulmonary disease with (acute) exacerbation: Secondary | ICD-10-CM | POA: Diagnosis not present

## 2016-07-30 DIAGNOSIS — J45909 Unspecified asthma, uncomplicated: Secondary | ICD-10-CM | POA: Diagnosis not present

## 2016-07-30 DIAGNOSIS — J440 Chronic obstructive pulmonary disease with acute lower respiratory infection: Secondary | ICD-10-CM | POA: Diagnosis not present

## 2016-07-30 DIAGNOSIS — I10 Essential (primary) hypertension: Secondary | ICD-10-CM | POA: Diagnosis not present

## 2016-07-30 DIAGNOSIS — I509 Heart failure, unspecified: Secondary | ICD-10-CM | POA: Diagnosis not present

## 2016-07-30 DIAGNOSIS — J189 Pneumonia, unspecified organism: Secondary | ICD-10-CM | POA: Diagnosis not present

## 2016-08-02 DIAGNOSIS — S83249A Other tear of medial meniscus, current injury, unspecified knee, initial encounter: Secondary | ICD-10-CM | POA: Diagnosis not present

## 2016-08-03 DIAGNOSIS — F419 Anxiety disorder, unspecified: Secondary | ICD-10-CM | POA: Diagnosis not present

## 2016-08-03 DIAGNOSIS — M2550 Pain in unspecified joint: Secondary | ICD-10-CM | POA: Diagnosis not present

## 2016-08-03 DIAGNOSIS — G9340 Encephalopathy, unspecified: Secondary | ICD-10-CM | POA: Diagnosis not present

## 2016-08-03 DIAGNOSIS — I509 Heart failure, unspecified: Secondary | ICD-10-CM | POA: Diagnosis not present

## 2016-08-03 DIAGNOSIS — I10 Essential (primary) hypertension: Secondary | ICD-10-CM | POA: Diagnosis not present

## 2016-08-03 DIAGNOSIS — Z79899 Other long term (current) drug therapy: Secondary | ICD-10-CM | POA: Diagnosis not present

## 2016-08-03 DIAGNOSIS — J449 Chronic obstructive pulmonary disease, unspecified: Secondary | ICD-10-CM | POA: Diagnosis not present

## 2016-08-03 DIAGNOSIS — K7689 Other specified diseases of liver: Secondary | ICD-10-CM | POA: Diagnosis not present

## 2016-08-03 DIAGNOSIS — J189 Pneumonia, unspecified organism: Secondary | ICD-10-CM | POA: Diagnosis not present

## 2016-08-03 DIAGNOSIS — J441 Chronic obstructive pulmonary disease with (acute) exacerbation: Secondary | ICD-10-CM | POA: Diagnosis not present

## 2016-08-03 DIAGNOSIS — J45909 Unspecified asthma, uncomplicated: Secondary | ICD-10-CM | POA: Diagnosis not present

## 2016-08-03 DIAGNOSIS — Z6830 Body mass index (BMI) 30.0-30.9, adult: Secondary | ICD-10-CM | POA: Diagnosis not present

## 2016-08-03 DIAGNOSIS — J440 Chronic obstructive pulmonary disease with acute lower respiratory infection: Secondary | ICD-10-CM | POA: Diagnosis not present

## 2016-08-04 DIAGNOSIS — J45909 Unspecified asthma, uncomplicated: Secondary | ICD-10-CM | POA: Diagnosis not present

## 2016-08-04 DIAGNOSIS — J440 Chronic obstructive pulmonary disease with acute lower respiratory infection: Secondary | ICD-10-CM | POA: Diagnosis not present

## 2016-08-04 DIAGNOSIS — I509 Heart failure, unspecified: Secondary | ICD-10-CM | POA: Diagnosis not present

## 2016-08-04 DIAGNOSIS — I10 Essential (primary) hypertension: Secondary | ICD-10-CM | POA: Diagnosis not present

## 2016-08-04 DIAGNOSIS — J189 Pneumonia, unspecified organism: Secondary | ICD-10-CM | POA: Diagnosis not present

## 2016-08-04 DIAGNOSIS — J441 Chronic obstructive pulmonary disease with (acute) exacerbation: Secondary | ICD-10-CM | POA: Diagnosis not present

## 2016-08-09 DIAGNOSIS — Z79899 Other long term (current) drug therapy: Secondary | ICD-10-CM | POA: Diagnosis not present

## 2016-08-09 DIAGNOSIS — I10 Essential (primary) hypertension: Secondary | ICD-10-CM | POA: Diagnosis not present

## 2016-08-09 DIAGNOSIS — J441 Chronic obstructive pulmonary disease with (acute) exacerbation: Secondary | ICD-10-CM | POA: Diagnosis not present

## 2016-08-09 DIAGNOSIS — H1010 Acute atopic conjunctivitis, unspecified eye: Secondary | ICD-10-CM | POA: Diagnosis not present

## 2016-08-09 DIAGNOSIS — E785 Hyperlipidemia, unspecified: Secondary | ICD-10-CM | POA: Diagnosis not present

## 2016-08-09 DIAGNOSIS — J45909 Unspecified asthma, uncomplicated: Secondary | ICD-10-CM | POA: Diagnosis not present

## 2016-08-09 DIAGNOSIS — F419 Anxiety disorder, unspecified: Secondary | ICD-10-CM | POA: Diagnosis not present

## 2016-08-09 DIAGNOSIS — Z6829 Body mass index (BMI) 29.0-29.9, adult: Secondary | ICD-10-CM | POA: Diagnosis not present

## 2016-08-09 DIAGNOSIS — I509 Heart failure, unspecified: Secondary | ICD-10-CM | POA: Diagnosis not present

## 2016-08-09 DIAGNOSIS — J189 Pneumonia, unspecified organism: Secondary | ICD-10-CM | POA: Diagnosis not present

## 2016-08-09 DIAGNOSIS — J440 Chronic obstructive pulmonary disease with acute lower respiratory infection: Secondary | ICD-10-CM | POA: Diagnosis not present

## 2016-08-09 DIAGNOSIS — J449 Chronic obstructive pulmonary disease, unspecified: Secondary | ICD-10-CM | POA: Diagnosis not present

## 2016-08-09 DIAGNOSIS — R739 Hyperglycemia, unspecified: Secondary | ICD-10-CM | POA: Diagnosis not present

## 2016-08-10 DIAGNOSIS — X58XXXA Exposure to other specified factors, initial encounter: Secondary | ICD-10-CM | POA: Diagnosis not present

## 2016-08-10 DIAGNOSIS — S83241A Other tear of medial meniscus, current injury, right knee, initial encounter: Secondary | ICD-10-CM | POA: Diagnosis not present

## 2016-08-10 DIAGNOSIS — M25561 Pain in right knee: Secondary | ICD-10-CM | POA: Diagnosis not present

## 2016-08-11 DIAGNOSIS — Z8673 Personal history of transient ischemic attack (TIA), and cerebral infarction without residual deficits: Secondary | ICD-10-CM | POA: Diagnosis not present

## 2016-08-11 DIAGNOSIS — Z9981 Dependence on supplemental oxygen: Secondary | ICD-10-CM | POA: Diagnosis not present

## 2016-08-11 DIAGNOSIS — G4733 Obstructive sleep apnea (adult) (pediatric): Secondary | ICD-10-CM | POA: Diagnosis not present

## 2016-08-11 DIAGNOSIS — I10 Essential (primary) hypertension: Secondary | ICD-10-CM | POA: Diagnosis not present

## 2016-08-11 DIAGNOSIS — J45909 Unspecified asthma, uncomplicated: Secondary | ICD-10-CM | POA: Diagnosis not present

## 2016-08-11 DIAGNOSIS — J189 Pneumonia, unspecified organism: Secondary | ICD-10-CM | POA: Diagnosis not present

## 2016-08-11 DIAGNOSIS — Z87440 Personal history of urinary (tract) infections: Secondary | ICD-10-CM | POA: Diagnosis not present

## 2016-08-11 DIAGNOSIS — J441 Chronic obstructive pulmonary disease with (acute) exacerbation: Secondary | ICD-10-CM | POA: Diagnosis not present

## 2016-08-11 DIAGNOSIS — I509 Heart failure, unspecified: Secondary | ICD-10-CM | POA: Diagnosis not present

## 2016-08-11 DIAGNOSIS — G9050 Complex regional pain syndrome I, unspecified: Secondary | ICD-10-CM | POA: Diagnosis not present

## 2016-08-11 DIAGNOSIS — J440 Chronic obstructive pulmonary disease with acute lower respiratory infection: Secondary | ICD-10-CM | POA: Diagnosis not present

## 2016-08-12 DIAGNOSIS — J441 Chronic obstructive pulmonary disease with (acute) exacerbation: Secondary | ICD-10-CM | POA: Diagnosis not present

## 2016-08-12 DIAGNOSIS — J189 Pneumonia, unspecified organism: Secondary | ICD-10-CM | POA: Diagnosis not present

## 2016-08-12 DIAGNOSIS — J45909 Unspecified asthma, uncomplicated: Secondary | ICD-10-CM | POA: Diagnosis not present

## 2016-08-12 DIAGNOSIS — J440 Chronic obstructive pulmonary disease with acute lower respiratory infection: Secondary | ICD-10-CM | POA: Diagnosis not present

## 2016-08-12 DIAGNOSIS — I509 Heart failure, unspecified: Secondary | ICD-10-CM | POA: Diagnosis not present

## 2016-08-12 DIAGNOSIS — S83249A Other tear of medial meniscus, current injury, unspecified knee, initial encounter: Secondary | ICD-10-CM | POA: Diagnosis not present

## 2016-08-12 DIAGNOSIS — I10 Essential (primary) hypertension: Secondary | ICD-10-CM | POA: Diagnosis not present

## 2016-08-17 DIAGNOSIS — J440 Chronic obstructive pulmonary disease with acute lower respiratory infection: Secondary | ICD-10-CM | POA: Diagnosis not present

## 2016-08-17 DIAGNOSIS — J189 Pneumonia, unspecified organism: Secondary | ICD-10-CM | POA: Diagnosis not present

## 2016-08-17 DIAGNOSIS — J441 Chronic obstructive pulmonary disease with (acute) exacerbation: Secondary | ICD-10-CM | POA: Diagnosis not present

## 2016-08-17 DIAGNOSIS — I10 Essential (primary) hypertension: Secondary | ICD-10-CM | POA: Diagnosis not present

## 2016-08-17 DIAGNOSIS — J45909 Unspecified asthma, uncomplicated: Secondary | ICD-10-CM | POA: Diagnosis not present

## 2016-08-17 DIAGNOSIS — I509 Heart failure, unspecified: Secondary | ICD-10-CM | POA: Diagnosis not present

## 2016-08-18 DIAGNOSIS — J440 Chronic obstructive pulmonary disease with acute lower respiratory infection: Secondary | ICD-10-CM | POA: Diagnosis not present

## 2016-08-18 DIAGNOSIS — I10 Essential (primary) hypertension: Secondary | ICD-10-CM | POA: Diagnosis not present

## 2016-08-18 DIAGNOSIS — J45909 Unspecified asthma, uncomplicated: Secondary | ICD-10-CM | POA: Diagnosis not present

## 2016-08-18 DIAGNOSIS — J189 Pneumonia, unspecified organism: Secondary | ICD-10-CM | POA: Diagnosis not present

## 2016-08-18 DIAGNOSIS — J441 Chronic obstructive pulmonary disease with (acute) exacerbation: Secondary | ICD-10-CM | POA: Diagnosis not present

## 2016-08-18 DIAGNOSIS — I509 Heart failure, unspecified: Secondary | ICD-10-CM | POA: Diagnosis not present

## 2016-08-20 DIAGNOSIS — R5383 Other fatigue: Secondary | ICD-10-CM | POA: Diagnosis not present

## 2016-08-20 DIAGNOSIS — J4540 Moderate persistent asthma, uncomplicated: Secondary | ICD-10-CM | POA: Diagnosis not present

## 2016-08-24 DIAGNOSIS — J45909 Unspecified asthma, uncomplicated: Secondary | ICD-10-CM | POA: Diagnosis not present

## 2016-08-24 DIAGNOSIS — I10 Essential (primary) hypertension: Secondary | ICD-10-CM | POA: Diagnosis not present

## 2016-08-24 DIAGNOSIS — I509 Heart failure, unspecified: Secondary | ICD-10-CM | POA: Diagnosis not present

## 2016-08-24 DIAGNOSIS — J441 Chronic obstructive pulmonary disease with (acute) exacerbation: Secondary | ICD-10-CM | POA: Diagnosis not present

## 2016-08-24 DIAGNOSIS — J440 Chronic obstructive pulmonary disease with acute lower respiratory infection: Secondary | ICD-10-CM | POA: Diagnosis not present

## 2016-08-24 DIAGNOSIS — J189 Pneumonia, unspecified organism: Secondary | ICD-10-CM | POA: Diagnosis not present

## 2016-08-26 DIAGNOSIS — Z6829 Body mass index (BMI) 29.0-29.9, adult: Secondary | ICD-10-CM | POA: Diagnosis not present

## 2016-08-26 DIAGNOSIS — M25561 Pain in right knee: Secondary | ICD-10-CM | POA: Diagnosis not present

## 2016-08-26 DIAGNOSIS — R29898 Other symptoms and signs involving the musculoskeletal system: Secondary | ICD-10-CM | POA: Diagnosis not present

## 2016-08-26 DIAGNOSIS — K7689 Other specified diseases of liver: Secondary | ICD-10-CM | POA: Diagnosis not present

## 2016-08-28 DIAGNOSIS — R945 Abnormal results of liver function studies: Secondary | ICD-10-CM | POA: Diagnosis not present

## 2016-08-28 DIAGNOSIS — K7689 Other specified diseases of liver: Secondary | ICD-10-CM | POA: Diagnosis not present

## 2016-08-28 DIAGNOSIS — K760 Fatty (change of) liver, not elsewhere classified: Secondary | ICD-10-CM | POA: Diagnosis not present

## 2016-08-31 DIAGNOSIS — J441 Chronic obstructive pulmonary disease with (acute) exacerbation: Secondary | ICD-10-CM | POA: Diagnosis not present

## 2016-08-31 DIAGNOSIS — I10 Essential (primary) hypertension: Secondary | ICD-10-CM | POA: Diagnosis not present

## 2016-08-31 DIAGNOSIS — I509 Heart failure, unspecified: Secondary | ICD-10-CM | POA: Diagnosis not present

## 2016-08-31 DIAGNOSIS — J45909 Unspecified asthma, uncomplicated: Secondary | ICD-10-CM | POA: Diagnosis not present

## 2016-08-31 DIAGNOSIS — J189 Pneumonia, unspecified organism: Secondary | ICD-10-CM | POA: Diagnosis not present

## 2016-08-31 DIAGNOSIS — J440 Chronic obstructive pulmonary disease with acute lower respiratory infection: Secondary | ICD-10-CM | POA: Diagnosis not present

## 2016-09-06 DIAGNOSIS — J441 Chronic obstructive pulmonary disease with (acute) exacerbation: Secondary | ICD-10-CM | POA: Diagnosis not present

## 2016-09-06 DIAGNOSIS — I10 Essential (primary) hypertension: Secondary | ICD-10-CM | POA: Diagnosis not present

## 2016-09-06 DIAGNOSIS — I509 Heart failure, unspecified: Secondary | ICD-10-CM | POA: Diagnosis not present

## 2016-09-06 DIAGNOSIS — J45909 Unspecified asthma, uncomplicated: Secondary | ICD-10-CM | POA: Diagnosis not present

## 2016-09-06 DIAGNOSIS — J189 Pneumonia, unspecified organism: Secondary | ICD-10-CM | POA: Diagnosis not present

## 2016-09-06 DIAGNOSIS — J440 Chronic obstructive pulmonary disease with acute lower respiratory infection: Secondary | ICD-10-CM | POA: Diagnosis not present

## 2016-09-07 DIAGNOSIS — M65861 Other synovitis and tenosynovitis, right lower leg: Secondary | ICD-10-CM | POA: Diagnosis not present

## 2016-09-07 DIAGNOSIS — Z87891 Personal history of nicotine dependence: Secondary | ICD-10-CM | POA: Diagnosis not present

## 2016-09-07 DIAGNOSIS — E785 Hyperlipidemia, unspecified: Secondary | ICD-10-CM | POA: Diagnosis not present

## 2016-09-07 DIAGNOSIS — S83241A Other tear of medial meniscus, current injury, right knee, initial encounter: Secondary | ICD-10-CM | POA: Diagnosis not present

## 2016-09-07 DIAGNOSIS — M23321 Other meniscus derangements, posterior horn of medial meniscus, right knee: Secondary | ICD-10-CM | POA: Diagnosis not present

## 2016-09-07 DIAGNOSIS — M1711 Unilateral primary osteoarthritis, right knee: Secondary | ICD-10-CM | POA: Diagnosis not present

## 2016-09-07 DIAGNOSIS — Z79891 Long term (current) use of opiate analgesic: Secondary | ICD-10-CM | POA: Diagnosis not present

## 2016-09-07 DIAGNOSIS — Z79899 Other long term (current) drug therapy: Secondary | ICD-10-CM | POA: Diagnosis not present

## 2016-09-07 DIAGNOSIS — G4733 Obstructive sleep apnea (adult) (pediatric): Secondary | ICD-10-CM | POA: Diagnosis not present

## 2016-09-09 DIAGNOSIS — J189 Pneumonia, unspecified organism: Secondary | ICD-10-CM | POA: Diagnosis not present

## 2016-09-09 DIAGNOSIS — J440 Chronic obstructive pulmonary disease with acute lower respiratory infection: Secondary | ICD-10-CM | POA: Diagnosis not present

## 2016-09-09 DIAGNOSIS — J45909 Unspecified asthma, uncomplicated: Secondary | ICD-10-CM | POA: Diagnosis not present

## 2016-09-09 DIAGNOSIS — I509 Heart failure, unspecified: Secondary | ICD-10-CM | POA: Diagnosis not present

## 2016-09-09 DIAGNOSIS — I10 Essential (primary) hypertension: Secondary | ICD-10-CM | POA: Diagnosis not present

## 2016-09-09 DIAGNOSIS — J441 Chronic obstructive pulmonary disease with (acute) exacerbation: Secondary | ICD-10-CM | POA: Diagnosis not present

## 2016-09-13 DIAGNOSIS — J189 Pneumonia, unspecified organism: Secondary | ICD-10-CM | POA: Diagnosis not present

## 2016-09-13 DIAGNOSIS — J440 Chronic obstructive pulmonary disease with acute lower respiratory infection: Secondary | ICD-10-CM | POA: Diagnosis not present

## 2016-09-13 DIAGNOSIS — J441 Chronic obstructive pulmonary disease with (acute) exacerbation: Secondary | ICD-10-CM | POA: Diagnosis not present

## 2016-09-13 DIAGNOSIS — J45909 Unspecified asthma, uncomplicated: Secondary | ICD-10-CM | POA: Diagnosis not present

## 2016-09-13 DIAGNOSIS — I10 Essential (primary) hypertension: Secondary | ICD-10-CM | POA: Diagnosis not present

## 2016-09-13 DIAGNOSIS — I509 Heart failure, unspecified: Secondary | ICD-10-CM | POA: Diagnosis not present

## 2016-09-14 DIAGNOSIS — I10 Essential (primary) hypertension: Secondary | ICD-10-CM | POA: Diagnosis not present

## 2016-09-14 DIAGNOSIS — J45909 Unspecified asthma, uncomplicated: Secondary | ICD-10-CM | POA: Diagnosis not present

## 2016-09-14 DIAGNOSIS — J189 Pneumonia, unspecified organism: Secondary | ICD-10-CM | POA: Diagnosis not present

## 2016-09-14 DIAGNOSIS — J440 Chronic obstructive pulmonary disease with acute lower respiratory infection: Secondary | ICD-10-CM | POA: Diagnosis not present

## 2016-09-14 DIAGNOSIS — J441 Chronic obstructive pulmonary disease with (acute) exacerbation: Secondary | ICD-10-CM | POA: Diagnosis not present

## 2016-09-14 DIAGNOSIS — I509 Heart failure, unspecified: Secondary | ICD-10-CM | POA: Diagnosis not present

## 2016-09-16 DIAGNOSIS — J441 Chronic obstructive pulmonary disease with (acute) exacerbation: Secondary | ICD-10-CM | POA: Diagnosis not present

## 2016-09-16 DIAGNOSIS — I509 Heart failure, unspecified: Secondary | ICD-10-CM | POA: Diagnosis not present

## 2016-09-16 DIAGNOSIS — J440 Chronic obstructive pulmonary disease with acute lower respiratory infection: Secondary | ICD-10-CM | POA: Diagnosis not present

## 2016-09-16 DIAGNOSIS — I10 Essential (primary) hypertension: Secondary | ICD-10-CM | POA: Diagnosis not present

## 2016-09-16 DIAGNOSIS — J189 Pneumonia, unspecified organism: Secondary | ICD-10-CM | POA: Diagnosis not present

## 2016-09-16 DIAGNOSIS — J45909 Unspecified asthma, uncomplicated: Secondary | ICD-10-CM | POA: Diagnosis not present

## 2016-09-21 DIAGNOSIS — J45909 Unspecified asthma, uncomplicated: Secondary | ICD-10-CM | POA: Diagnosis not present

## 2016-09-21 DIAGNOSIS — J189 Pneumonia, unspecified organism: Secondary | ICD-10-CM | POA: Diagnosis not present

## 2016-09-21 DIAGNOSIS — I10 Essential (primary) hypertension: Secondary | ICD-10-CM | POA: Diagnosis not present

## 2016-09-21 DIAGNOSIS — I509 Heart failure, unspecified: Secondary | ICD-10-CM | POA: Diagnosis not present

## 2016-09-21 DIAGNOSIS — J441 Chronic obstructive pulmonary disease with (acute) exacerbation: Secondary | ICD-10-CM | POA: Diagnosis not present

## 2016-09-21 DIAGNOSIS — J440 Chronic obstructive pulmonary disease with acute lower respiratory infection: Secondary | ICD-10-CM | POA: Diagnosis not present

## 2016-09-22 DIAGNOSIS — I10 Essential (primary) hypertension: Secondary | ICD-10-CM | POA: Diagnosis not present

## 2016-09-22 DIAGNOSIS — J45909 Unspecified asthma, uncomplicated: Secondary | ICD-10-CM | POA: Diagnosis not present

## 2016-09-22 DIAGNOSIS — J440 Chronic obstructive pulmonary disease with acute lower respiratory infection: Secondary | ICD-10-CM | POA: Diagnosis not present

## 2016-09-22 DIAGNOSIS — J189 Pneumonia, unspecified organism: Secondary | ICD-10-CM | POA: Diagnosis not present

## 2016-09-22 DIAGNOSIS — I509 Heart failure, unspecified: Secondary | ICD-10-CM | POA: Diagnosis not present

## 2016-09-22 DIAGNOSIS — J441 Chronic obstructive pulmonary disease with (acute) exacerbation: Secondary | ICD-10-CM | POA: Diagnosis not present

## 2016-09-24 DIAGNOSIS — I509 Heart failure, unspecified: Secondary | ICD-10-CM | POA: Diagnosis not present

## 2016-09-24 DIAGNOSIS — J189 Pneumonia, unspecified organism: Secondary | ICD-10-CM | POA: Diagnosis not present

## 2016-09-24 DIAGNOSIS — I10 Essential (primary) hypertension: Secondary | ICD-10-CM | POA: Diagnosis not present

## 2016-09-24 DIAGNOSIS — J45909 Unspecified asthma, uncomplicated: Secondary | ICD-10-CM | POA: Diagnosis not present

## 2016-09-24 DIAGNOSIS — J440 Chronic obstructive pulmonary disease with acute lower respiratory infection: Secondary | ICD-10-CM | POA: Diagnosis not present

## 2016-09-24 DIAGNOSIS — J441 Chronic obstructive pulmonary disease with (acute) exacerbation: Secondary | ICD-10-CM | POA: Diagnosis not present

## 2016-09-27 DIAGNOSIS — J440 Chronic obstructive pulmonary disease with acute lower respiratory infection: Secondary | ICD-10-CM | POA: Diagnosis not present

## 2016-09-27 DIAGNOSIS — I509 Heart failure, unspecified: Secondary | ICD-10-CM | POA: Diagnosis not present

## 2016-09-27 DIAGNOSIS — J45909 Unspecified asthma, uncomplicated: Secondary | ICD-10-CM | POA: Diagnosis not present

## 2016-09-27 DIAGNOSIS — I10 Essential (primary) hypertension: Secondary | ICD-10-CM | POA: Diagnosis not present

## 2016-09-27 DIAGNOSIS — J441 Chronic obstructive pulmonary disease with (acute) exacerbation: Secondary | ICD-10-CM | POA: Diagnosis not present

## 2016-09-27 DIAGNOSIS — J189 Pneumonia, unspecified organism: Secondary | ICD-10-CM | POA: Diagnosis not present

## 2016-09-28 DIAGNOSIS — I509 Heart failure, unspecified: Secondary | ICD-10-CM | POA: Diagnosis not present

## 2016-09-28 DIAGNOSIS — J45909 Unspecified asthma, uncomplicated: Secondary | ICD-10-CM | POA: Diagnosis not present

## 2016-09-28 DIAGNOSIS — J440 Chronic obstructive pulmonary disease with acute lower respiratory infection: Secondary | ICD-10-CM | POA: Diagnosis not present

## 2016-09-28 DIAGNOSIS — J441 Chronic obstructive pulmonary disease with (acute) exacerbation: Secondary | ICD-10-CM | POA: Diagnosis not present

## 2016-09-28 DIAGNOSIS — I10 Essential (primary) hypertension: Secondary | ICD-10-CM | POA: Diagnosis not present

## 2016-09-28 DIAGNOSIS — J189 Pneumonia, unspecified organism: Secondary | ICD-10-CM | POA: Diagnosis not present

## 2016-09-29 DIAGNOSIS — G4700 Insomnia, unspecified: Secondary | ICD-10-CM | POA: Diagnosis not present

## 2016-09-29 DIAGNOSIS — R739 Hyperglycemia, unspecified: Secondary | ICD-10-CM | POA: Diagnosis not present

## 2016-09-29 DIAGNOSIS — Z6830 Body mass index (BMI) 30.0-30.9, adult: Secondary | ICD-10-CM | POA: Diagnosis not present

## 2016-09-29 DIAGNOSIS — F419 Anxiety disorder, unspecified: Secondary | ICD-10-CM | POA: Diagnosis not present

## 2016-09-29 DIAGNOSIS — Z1231 Encounter for screening mammogram for malignant neoplasm of breast: Secondary | ICD-10-CM | POA: Diagnosis not present

## 2016-10-01 DIAGNOSIS — J189 Pneumonia, unspecified organism: Secondary | ICD-10-CM | POA: Diagnosis not present

## 2016-10-01 DIAGNOSIS — I10 Essential (primary) hypertension: Secondary | ICD-10-CM | POA: Diagnosis not present

## 2016-10-01 DIAGNOSIS — J45909 Unspecified asthma, uncomplicated: Secondary | ICD-10-CM | POA: Diagnosis not present

## 2016-10-01 DIAGNOSIS — I509 Heart failure, unspecified: Secondary | ICD-10-CM | POA: Diagnosis not present

## 2016-10-01 DIAGNOSIS — J440 Chronic obstructive pulmonary disease with acute lower respiratory infection: Secondary | ICD-10-CM | POA: Diagnosis not present

## 2016-10-01 DIAGNOSIS — J441 Chronic obstructive pulmonary disease with (acute) exacerbation: Secondary | ICD-10-CM | POA: Diagnosis not present

## 2016-10-05 DIAGNOSIS — Z1231 Encounter for screening mammogram for malignant neoplasm of breast: Secondary | ICD-10-CM | POA: Diagnosis not present

## 2016-10-06 ENCOUNTER — Ambulatory Visit: Payer: Medicare Other | Admitting: Physical Therapy

## 2016-10-14 DIAGNOSIS — Z8673 Personal history of transient ischemic attack (TIA), and cerebral infarction without residual deficits: Secondary | ICD-10-CM | POA: Diagnosis not present

## 2016-10-14 DIAGNOSIS — Z9981 Dependence on supplemental oxygen: Secondary | ICD-10-CM | POA: Diagnosis not present

## 2016-10-14 DIAGNOSIS — J440 Chronic obstructive pulmonary disease with acute lower respiratory infection: Secondary | ICD-10-CM | POA: Diagnosis not present

## 2016-10-14 DIAGNOSIS — G9050 Complex regional pain syndrome I, unspecified: Secondary | ICD-10-CM | POA: Diagnosis not present

## 2016-10-14 DIAGNOSIS — Z87440 Personal history of urinary (tract) infections: Secondary | ICD-10-CM | POA: Diagnosis not present

## 2016-10-14 DIAGNOSIS — J45909 Unspecified asthma, uncomplicated: Secondary | ICD-10-CM | POA: Diagnosis not present

## 2016-10-14 DIAGNOSIS — R2689 Other abnormalities of gait and mobility: Secondary | ICD-10-CM | POA: Diagnosis not present

## 2016-10-14 DIAGNOSIS — G4733 Obstructive sleep apnea (adult) (pediatric): Secondary | ICD-10-CM | POA: Diagnosis not present

## 2016-10-14 DIAGNOSIS — I10 Essential (primary) hypertension: Secondary | ICD-10-CM | POA: Diagnosis not present

## 2016-10-14 DIAGNOSIS — I509 Heart failure, unspecified: Secondary | ICD-10-CM | POA: Diagnosis not present

## 2016-10-27 DIAGNOSIS — Z1389 Encounter for screening for other disorder: Secondary | ICD-10-CM | POA: Diagnosis not present

## 2016-10-27 DIAGNOSIS — Z79899 Other long term (current) drug therapy: Secondary | ICD-10-CM | POA: Diagnosis not present

## 2016-10-27 DIAGNOSIS — M2550 Pain in unspecified joint: Secondary | ICD-10-CM | POA: Diagnosis not present

## 2016-10-27 DIAGNOSIS — Z6826 Body mass index (BMI) 26.0-26.9, adult: Secondary | ICD-10-CM | POA: Diagnosis not present

## 2016-10-27 DIAGNOSIS — J449 Chronic obstructive pulmonary disease, unspecified: Secondary | ICD-10-CM | POA: Diagnosis not present

## 2016-10-27 DIAGNOSIS — E785 Hyperlipidemia, unspecified: Secondary | ICD-10-CM | POA: Diagnosis not present

## 2016-10-27 DIAGNOSIS — I10 Essential (primary) hypertension: Secondary | ICD-10-CM | POA: Diagnosis not present

## 2016-10-27 DIAGNOSIS — J069 Acute upper respiratory infection, unspecified: Secondary | ICD-10-CM | POA: Diagnosis not present

## 2016-10-27 DIAGNOSIS — R739 Hyperglycemia, unspecified: Secondary | ICD-10-CM | POA: Diagnosis not present

## 2016-10-29 DIAGNOSIS — R2689 Other abnormalities of gait and mobility: Secondary | ICD-10-CM | POA: Diagnosis not present

## 2016-10-29 DIAGNOSIS — I509 Heart failure, unspecified: Secondary | ICD-10-CM | POA: Diagnosis not present

## 2016-10-29 DIAGNOSIS — I10 Essential (primary) hypertension: Secondary | ICD-10-CM | POA: Diagnosis not present

## 2016-10-29 DIAGNOSIS — J45909 Unspecified asthma, uncomplicated: Secondary | ICD-10-CM | POA: Diagnosis not present

## 2016-10-29 DIAGNOSIS — G4733 Obstructive sleep apnea (adult) (pediatric): Secondary | ICD-10-CM | POA: Diagnosis not present

## 2016-10-29 DIAGNOSIS — J440 Chronic obstructive pulmonary disease with acute lower respiratory infection: Secondary | ICD-10-CM | POA: Diagnosis not present

## 2016-10-30 DIAGNOSIS — I10 Essential (primary) hypertension: Secondary | ICD-10-CM | POA: Diagnosis not present

## 2016-10-30 DIAGNOSIS — G4733 Obstructive sleep apnea (adult) (pediatric): Secondary | ICD-10-CM | POA: Diagnosis not present

## 2016-10-30 DIAGNOSIS — J440 Chronic obstructive pulmonary disease with acute lower respiratory infection: Secondary | ICD-10-CM | POA: Diagnosis not present

## 2016-10-30 DIAGNOSIS — R2689 Other abnormalities of gait and mobility: Secondary | ICD-10-CM | POA: Diagnosis not present

## 2016-10-30 DIAGNOSIS — J45909 Unspecified asthma, uncomplicated: Secondary | ICD-10-CM | POA: Diagnosis not present

## 2016-10-30 DIAGNOSIS — I509 Heart failure, unspecified: Secondary | ICD-10-CM | POA: Diagnosis not present

## 2016-11-01 DIAGNOSIS — J440 Chronic obstructive pulmonary disease with acute lower respiratory infection: Secondary | ICD-10-CM | POA: Diagnosis not present

## 2016-11-01 DIAGNOSIS — I509 Heart failure, unspecified: Secondary | ICD-10-CM | POA: Diagnosis not present

## 2016-11-01 DIAGNOSIS — I10 Essential (primary) hypertension: Secondary | ICD-10-CM | POA: Diagnosis not present

## 2016-11-01 DIAGNOSIS — R2689 Other abnormalities of gait and mobility: Secondary | ICD-10-CM | POA: Diagnosis not present

## 2016-11-01 DIAGNOSIS — J45909 Unspecified asthma, uncomplicated: Secondary | ICD-10-CM | POA: Diagnosis not present

## 2016-11-01 DIAGNOSIS — G4733 Obstructive sleep apnea (adult) (pediatric): Secondary | ICD-10-CM | POA: Diagnosis not present

## 2016-11-03 DIAGNOSIS — J45909 Unspecified asthma, uncomplicated: Secondary | ICD-10-CM | POA: Diagnosis not present

## 2016-11-03 DIAGNOSIS — I10 Essential (primary) hypertension: Secondary | ICD-10-CM | POA: Diagnosis not present

## 2016-11-03 DIAGNOSIS — G4733 Obstructive sleep apnea (adult) (pediatric): Secondary | ICD-10-CM | POA: Diagnosis not present

## 2016-11-03 DIAGNOSIS — J440 Chronic obstructive pulmonary disease with acute lower respiratory infection: Secondary | ICD-10-CM | POA: Diagnosis not present

## 2016-11-03 DIAGNOSIS — R2689 Other abnormalities of gait and mobility: Secondary | ICD-10-CM | POA: Diagnosis not present

## 2016-11-03 DIAGNOSIS — I509 Heart failure, unspecified: Secondary | ICD-10-CM | POA: Diagnosis not present

## 2016-11-08 DIAGNOSIS — I509 Heart failure, unspecified: Secondary | ICD-10-CM | POA: Diagnosis not present

## 2016-11-08 DIAGNOSIS — J440 Chronic obstructive pulmonary disease with acute lower respiratory infection: Secondary | ICD-10-CM | POA: Diagnosis not present

## 2016-11-08 DIAGNOSIS — R2689 Other abnormalities of gait and mobility: Secondary | ICD-10-CM | POA: Diagnosis not present

## 2016-11-08 DIAGNOSIS — G4733 Obstructive sleep apnea (adult) (pediatric): Secondary | ICD-10-CM | POA: Diagnosis not present

## 2016-11-08 DIAGNOSIS — I10 Essential (primary) hypertension: Secondary | ICD-10-CM | POA: Diagnosis not present

## 2016-11-08 DIAGNOSIS — J45909 Unspecified asthma, uncomplicated: Secondary | ICD-10-CM | POA: Diagnosis not present

## 2016-11-09 DIAGNOSIS — I509 Heart failure, unspecified: Secondary | ICD-10-CM | POA: Diagnosis not present

## 2016-11-09 DIAGNOSIS — G4733 Obstructive sleep apnea (adult) (pediatric): Secondary | ICD-10-CM | POA: Diagnosis not present

## 2016-11-09 DIAGNOSIS — R2689 Other abnormalities of gait and mobility: Secondary | ICD-10-CM | POA: Diagnosis not present

## 2016-11-09 DIAGNOSIS — J45909 Unspecified asthma, uncomplicated: Secondary | ICD-10-CM | POA: Diagnosis not present

## 2016-11-09 DIAGNOSIS — J440 Chronic obstructive pulmonary disease with acute lower respiratory infection: Secondary | ICD-10-CM | POA: Diagnosis not present

## 2016-11-09 DIAGNOSIS — I10 Essential (primary) hypertension: Secondary | ICD-10-CM | POA: Diagnosis not present

## 2016-11-10 DIAGNOSIS — I700 Atherosclerosis of aorta: Secondary | ICD-10-CM | POA: Diagnosis not present

## 2016-11-10 DIAGNOSIS — R05 Cough: Secondary | ICD-10-CM | POA: Diagnosis not present

## 2016-11-10 DIAGNOSIS — R079 Chest pain, unspecified: Secondary | ICD-10-CM | POA: Diagnosis not present

## 2016-11-10 DIAGNOSIS — R062 Wheezing: Secondary | ICD-10-CM | POA: Diagnosis not present

## 2016-11-10 DIAGNOSIS — R0602 Shortness of breath: Secondary | ICD-10-CM | POA: Diagnosis not present

## 2016-11-12 DIAGNOSIS — J45909 Unspecified asthma, uncomplicated: Secondary | ICD-10-CM | POA: Diagnosis not present

## 2016-11-12 DIAGNOSIS — R2689 Other abnormalities of gait and mobility: Secondary | ICD-10-CM | POA: Diagnosis not present

## 2016-11-12 DIAGNOSIS — I509 Heart failure, unspecified: Secondary | ICD-10-CM | POA: Diagnosis not present

## 2016-11-12 DIAGNOSIS — G4733 Obstructive sleep apnea (adult) (pediatric): Secondary | ICD-10-CM | POA: Diagnosis not present

## 2016-11-12 DIAGNOSIS — I10 Essential (primary) hypertension: Secondary | ICD-10-CM | POA: Diagnosis not present

## 2016-11-12 DIAGNOSIS — J440 Chronic obstructive pulmonary disease with acute lower respiratory infection: Secondary | ICD-10-CM | POA: Diagnosis not present

## 2016-11-16 DIAGNOSIS — G4733 Obstructive sleep apnea (adult) (pediatric): Secondary | ICD-10-CM | POA: Diagnosis not present

## 2016-11-16 DIAGNOSIS — J440 Chronic obstructive pulmonary disease with acute lower respiratory infection: Secondary | ICD-10-CM | POA: Diagnosis not present

## 2016-11-16 DIAGNOSIS — I10 Essential (primary) hypertension: Secondary | ICD-10-CM | POA: Diagnosis not present

## 2016-11-16 DIAGNOSIS — I509 Heart failure, unspecified: Secondary | ICD-10-CM | POA: Diagnosis not present

## 2016-11-16 DIAGNOSIS — R2689 Other abnormalities of gait and mobility: Secondary | ICD-10-CM | POA: Diagnosis not present

## 2016-11-16 DIAGNOSIS — J45909 Unspecified asthma, uncomplicated: Secondary | ICD-10-CM | POA: Diagnosis not present

## 2016-11-18 DIAGNOSIS — I10 Essential (primary) hypertension: Secondary | ICD-10-CM | POA: Diagnosis not present

## 2016-11-18 DIAGNOSIS — R2689 Other abnormalities of gait and mobility: Secondary | ICD-10-CM | POA: Diagnosis not present

## 2016-11-18 DIAGNOSIS — J440 Chronic obstructive pulmonary disease with acute lower respiratory infection: Secondary | ICD-10-CM | POA: Diagnosis not present

## 2016-11-18 DIAGNOSIS — J45909 Unspecified asthma, uncomplicated: Secondary | ICD-10-CM | POA: Diagnosis not present

## 2016-11-18 DIAGNOSIS — I509 Heart failure, unspecified: Secondary | ICD-10-CM | POA: Diagnosis not present

## 2016-11-18 DIAGNOSIS — G4733 Obstructive sleep apnea (adult) (pediatric): Secondary | ICD-10-CM | POA: Diagnosis not present

## 2016-11-23 DIAGNOSIS — R2689 Other abnormalities of gait and mobility: Secondary | ICD-10-CM | POA: Diagnosis not present

## 2016-11-23 DIAGNOSIS — I509 Heart failure, unspecified: Secondary | ICD-10-CM | POA: Diagnosis not present

## 2016-11-23 DIAGNOSIS — I10 Essential (primary) hypertension: Secondary | ICD-10-CM | POA: Diagnosis not present

## 2016-11-23 DIAGNOSIS — J45909 Unspecified asthma, uncomplicated: Secondary | ICD-10-CM | POA: Diagnosis not present

## 2016-11-23 DIAGNOSIS — J440 Chronic obstructive pulmonary disease with acute lower respiratory infection: Secondary | ICD-10-CM | POA: Diagnosis not present

## 2016-11-23 DIAGNOSIS — G4733 Obstructive sleep apnea (adult) (pediatric): Secondary | ICD-10-CM | POA: Diagnosis not present

## 2016-11-25 DIAGNOSIS — G4733 Obstructive sleep apnea (adult) (pediatric): Secondary | ICD-10-CM | POA: Diagnosis not present

## 2016-11-25 DIAGNOSIS — I509 Heart failure, unspecified: Secondary | ICD-10-CM | POA: Diagnosis not present

## 2016-11-25 DIAGNOSIS — I10 Essential (primary) hypertension: Secondary | ICD-10-CM | POA: Diagnosis not present

## 2016-11-25 DIAGNOSIS — J45909 Unspecified asthma, uncomplicated: Secondary | ICD-10-CM | POA: Diagnosis not present

## 2016-11-25 DIAGNOSIS — R2689 Other abnormalities of gait and mobility: Secondary | ICD-10-CM | POA: Diagnosis not present

## 2016-11-25 DIAGNOSIS — J440 Chronic obstructive pulmonary disease with acute lower respiratory infection: Secondary | ICD-10-CM | POA: Diagnosis not present

## 2016-11-29 DIAGNOSIS — G4733 Obstructive sleep apnea (adult) (pediatric): Secondary | ICD-10-CM | POA: Diagnosis not present

## 2016-11-29 DIAGNOSIS — R2689 Other abnormalities of gait and mobility: Secondary | ICD-10-CM | POA: Diagnosis not present

## 2016-11-29 DIAGNOSIS — I10 Essential (primary) hypertension: Secondary | ICD-10-CM | POA: Diagnosis not present

## 2016-11-29 DIAGNOSIS — I509 Heart failure, unspecified: Secondary | ICD-10-CM | POA: Diagnosis not present

## 2016-11-29 DIAGNOSIS — J45909 Unspecified asthma, uncomplicated: Secondary | ICD-10-CM | POA: Diagnosis not present

## 2016-11-29 DIAGNOSIS — J440 Chronic obstructive pulmonary disease with acute lower respiratory infection: Secondary | ICD-10-CM | POA: Diagnosis not present

## 2016-12-01 DIAGNOSIS — I10 Essential (primary) hypertension: Secondary | ICD-10-CM | POA: Diagnosis not present

## 2016-12-01 DIAGNOSIS — R2689 Other abnormalities of gait and mobility: Secondary | ICD-10-CM | POA: Diagnosis not present

## 2016-12-01 DIAGNOSIS — J440 Chronic obstructive pulmonary disease with acute lower respiratory infection: Secondary | ICD-10-CM | POA: Diagnosis not present

## 2016-12-01 DIAGNOSIS — I509 Heart failure, unspecified: Secondary | ICD-10-CM | POA: Diagnosis not present

## 2016-12-01 DIAGNOSIS — J45909 Unspecified asthma, uncomplicated: Secondary | ICD-10-CM | POA: Diagnosis not present

## 2016-12-01 DIAGNOSIS — G4733 Obstructive sleep apnea (adult) (pediatric): Secondary | ICD-10-CM | POA: Diagnosis not present

## 2016-12-06 DIAGNOSIS — S83249A Other tear of medial meniscus, current injury, unspecified knee, initial encounter: Secondary | ICD-10-CM | POA: Diagnosis not present

## 2016-12-06 DIAGNOSIS — M1710 Unilateral primary osteoarthritis, unspecified knee: Secondary | ICD-10-CM | POA: Diagnosis not present

## 2016-12-07 DIAGNOSIS — I10 Essential (primary) hypertension: Secondary | ICD-10-CM | POA: Diagnosis not present

## 2016-12-07 DIAGNOSIS — J45909 Unspecified asthma, uncomplicated: Secondary | ICD-10-CM | POA: Diagnosis not present

## 2016-12-07 DIAGNOSIS — J440 Chronic obstructive pulmonary disease with acute lower respiratory infection: Secondary | ICD-10-CM | POA: Diagnosis not present

## 2016-12-07 DIAGNOSIS — G4733 Obstructive sleep apnea (adult) (pediatric): Secondary | ICD-10-CM | POA: Diagnosis not present

## 2016-12-07 DIAGNOSIS — I509 Heart failure, unspecified: Secondary | ICD-10-CM | POA: Diagnosis not present

## 2016-12-07 DIAGNOSIS — R2689 Other abnormalities of gait and mobility: Secondary | ICD-10-CM | POA: Diagnosis not present

## 2016-12-08 DIAGNOSIS — R5383 Other fatigue: Secondary | ICD-10-CM | POA: Diagnosis not present

## 2016-12-08 DIAGNOSIS — J4540 Moderate persistent asthma, uncomplicated: Secondary | ICD-10-CM | POA: Diagnosis not present

## 2016-12-08 DIAGNOSIS — J301 Allergic rhinitis due to pollen: Secondary | ICD-10-CM | POA: Diagnosis not present

## 2017-01-06 DIAGNOSIS — K210 Gastro-esophageal reflux disease with esophagitis: Secondary | ICD-10-CM | POA: Diagnosis not present

## 2017-01-17 DIAGNOSIS — M1710 Unilateral primary osteoarthritis, unspecified knee: Secondary | ICD-10-CM | POA: Diagnosis not present

## 2017-01-17 DIAGNOSIS — S83249A Other tear of medial meniscus, current injury, unspecified knee, initial encounter: Secondary | ICD-10-CM | POA: Diagnosis not present

## 2017-01-25 DIAGNOSIS — M25561 Pain in right knee: Secondary | ICD-10-CM | POA: Diagnosis not present

## 2017-01-25 DIAGNOSIS — Z79899 Other long term (current) drug therapy: Secondary | ICD-10-CM | POA: Diagnosis not present

## 2017-01-25 DIAGNOSIS — E785 Hyperlipidemia, unspecified: Secondary | ICD-10-CM | POA: Diagnosis not present

## 2017-01-25 DIAGNOSIS — R300 Dysuria: Secondary | ICD-10-CM | POA: Diagnosis not present

## 2017-01-25 DIAGNOSIS — F419 Anxiety disorder, unspecified: Secondary | ICD-10-CM | POA: Diagnosis not present

## 2017-01-25 DIAGNOSIS — J309 Allergic rhinitis, unspecified: Secondary | ICD-10-CM | POA: Diagnosis not present

## 2017-01-25 DIAGNOSIS — I10 Essential (primary) hypertension: Secondary | ICD-10-CM | POA: Diagnosis not present

## 2017-01-25 DIAGNOSIS — R739 Hyperglycemia, unspecified: Secondary | ICD-10-CM | POA: Diagnosis not present

## 2017-01-28 DIAGNOSIS — J449 Chronic obstructive pulmonary disease, unspecified: Secondary | ICD-10-CM | POA: Diagnosis not present

## 2017-02-01 DIAGNOSIS — M25652 Stiffness of left hip, not elsewhere classified: Secondary | ICD-10-CM | POA: Diagnosis not present

## 2017-02-01 DIAGNOSIS — R262 Difficulty in walking, not elsewhere classified: Secondary | ICD-10-CM | POA: Diagnosis not present

## 2017-02-01 DIAGNOSIS — S83249D Other tear of medial meniscus, current injury, unspecified knee, subsequent encounter: Secondary | ICD-10-CM | POA: Diagnosis not present

## 2017-02-01 DIAGNOSIS — M1710 Unilateral primary osteoarthritis, unspecified knee: Secondary | ICD-10-CM | POA: Diagnosis not present

## 2017-02-01 DIAGNOSIS — M25651 Stiffness of right hip, not elsewhere classified: Secondary | ICD-10-CM | POA: Diagnosis not present

## 2017-02-01 DIAGNOSIS — M25661 Stiffness of right knee, not elsewhere classified: Secondary | ICD-10-CM | POA: Diagnosis not present

## 2017-02-01 DIAGNOSIS — M25461 Effusion, right knee: Secondary | ICD-10-CM | POA: Diagnosis not present

## 2017-02-03 DIAGNOSIS — M25651 Stiffness of right hip, not elsewhere classified: Secondary | ICD-10-CM | POA: Diagnosis not present

## 2017-02-03 DIAGNOSIS — M25461 Effusion, right knee: Secondary | ICD-10-CM | POA: Diagnosis not present

## 2017-02-03 DIAGNOSIS — M25661 Stiffness of right knee, not elsewhere classified: Secondary | ICD-10-CM | POA: Diagnosis not present

## 2017-02-03 DIAGNOSIS — M25652 Stiffness of left hip, not elsewhere classified: Secondary | ICD-10-CM | POA: Diagnosis not present

## 2017-02-03 DIAGNOSIS — S83249D Other tear of medial meniscus, current injury, unspecified knee, subsequent encounter: Secondary | ICD-10-CM | POA: Diagnosis not present

## 2017-02-03 DIAGNOSIS — R262 Difficulty in walking, not elsewhere classified: Secondary | ICD-10-CM | POA: Diagnosis not present

## 2017-02-03 DIAGNOSIS — M1710 Unilateral primary osteoarthritis, unspecified knee: Secondary | ICD-10-CM | POA: Diagnosis not present

## 2017-02-08 DIAGNOSIS — M25651 Stiffness of right hip, not elsewhere classified: Secondary | ICD-10-CM | POA: Diagnosis not present

## 2017-02-08 DIAGNOSIS — M25461 Effusion, right knee: Secondary | ICD-10-CM | POA: Diagnosis not present

## 2017-02-08 DIAGNOSIS — M25661 Stiffness of right knee, not elsewhere classified: Secondary | ICD-10-CM | POA: Diagnosis not present

## 2017-02-08 DIAGNOSIS — S83249D Other tear of medial meniscus, current injury, unspecified knee, subsequent encounter: Secondary | ICD-10-CM | POA: Diagnosis not present

## 2017-02-08 DIAGNOSIS — M25652 Stiffness of left hip, not elsewhere classified: Secondary | ICD-10-CM | POA: Diagnosis not present

## 2017-02-08 DIAGNOSIS — R262 Difficulty in walking, not elsewhere classified: Secondary | ICD-10-CM | POA: Diagnosis not present

## 2017-02-08 DIAGNOSIS — M1710 Unilateral primary osteoarthritis, unspecified knee: Secondary | ICD-10-CM | POA: Diagnosis not present

## 2017-02-14 DIAGNOSIS — J449 Chronic obstructive pulmonary disease, unspecified: Secondary | ICD-10-CM | POA: Diagnosis not present

## 2017-02-27 DIAGNOSIS — J449 Chronic obstructive pulmonary disease, unspecified: Secondary | ICD-10-CM | POA: Diagnosis not present

## 2017-02-28 DIAGNOSIS — M25561 Pain in right knee: Secondary | ICD-10-CM | POA: Diagnosis not present

## 2017-02-28 DIAGNOSIS — G4700 Insomnia, unspecified: Secondary | ICD-10-CM | POA: Diagnosis not present

## 2017-03-03 DIAGNOSIS — I509 Heart failure, unspecified: Secondary | ICD-10-CM | POA: Diagnosis not present

## 2017-03-03 DIAGNOSIS — G9050 Complex regional pain syndrome I, unspecified: Secondary | ICD-10-CM | POA: Diagnosis not present

## 2017-03-03 DIAGNOSIS — Z9981 Dependence on supplemental oxygen: Secondary | ICD-10-CM | POA: Diagnosis not present

## 2017-03-03 DIAGNOSIS — G4733 Obstructive sleep apnea (adult) (pediatric): Secondary | ICD-10-CM | POA: Diagnosis not present

## 2017-03-03 DIAGNOSIS — J440 Chronic obstructive pulmonary disease with acute lower respiratory infection: Secondary | ICD-10-CM | POA: Diagnosis not present

## 2017-03-03 DIAGNOSIS — I10 Essential (primary) hypertension: Secondary | ICD-10-CM | POA: Diagnosis not present

## 2017-03-03 DIAGNOSIS — Z87440 Personal history of urinary (tract) infections: Secondary | ICD-10-CM | POA: Diagnosis not present

## 2017-03-03 DIAGNOSIS — J45909 Unspecified asthma, uncomplicated: Secondary | ICD-10-CM | POA: Diagnosis not present

## 2017-03-03 DIAGNOSIS — Z8673 Personal history of transient ischemic attack (TIA), and cerebral infarction without residual deficits: Secondary | ICD-10-CM | POA: Diagnosis not present

## 2017-03-03 DIAGNOSIS — R2689 Other abnormalities of gait and mobility: Secondary | ICD-10-CM | POA: Diagnosis not present

## 2017-03-07 DIAGNOSIS — Z9981 Dependence on supplemental oxygen: Secondary | ICD-10-CM | POA: Diagnosis not present

## 2017-03-07 DIAGNOSIS — M1711 Unilateral primary osteoarthritis, right knee: Secondary | ICD-10-CM | POA: Diagnosis not present

## 2017-03-07 DIAGNOSIS — I509 Heart failure, unspecified: Secondary | ICD-10-CM | POA: Diagnosis not present

## 2017-03-07 DIAGNOSIS — I10 Essential (primary) hypertension: Secondary | ICD-10-CM | POA: Diagnosis not present

## 2017-03-07 DIAGNOSIS — G4733 Obstructive sleep apnea (adult) (pediatric): Secondary | ICD-10-CM | POA: Diagnosis not present

## 2017-03-07 DIAGNOSIS — Z8673 Personal history of transient ischemic attack (TIA), and cerebral infarction without residual deficits: Secondary | ICD-10-CM | POA: Diagnosis not present

## 2017-03-07 DIAGNOSIS — R2689 Other abnormalities of gait and mobility: Secondary | ICD-10-CM | POA: Diagnosis not present

## 2017-03-07 DIAGNOSIS — G9050 Complex regional pain syndrome I, unspecified: Secondary | ICD-10-CM | POA: Diagnosis not present

## 2017-03-07 DIAGNOSIS — J440 Chronic obstructive pulmonary disease with acute lower respiratory infection: Secondary | ICD-10-CM | POA: Diagnosis not present

## 2017-03-07 DIAGNOSIS — J45909 Unspecified asthma, uncomplicated: Secondary | ICD-10-CM | POA: Diagnosis not present

## 2017-03-07 DIAGNOSIS — Z87440 Personal history of urinary (tract) infections: Secondary | ICD-10-CM | POA: Diagnosis not present

## 2017-03-14 DIAGNOSIS — M1711 Unilateral primary osteoarthritis, right knee: Secondary | ICD-10-CM | POA: Diagnosis not present

## 2017-03-15 DIAGNOSIS — J449 Chronic obstructive pulmonary disease, unspecified: Secondary | ICD-10-CM | POA: Diagnosis not present

## 2017-03-21 DIAGNOSIS — M1711 Unilateral primary osteoarthritis, right knee: Secondary | ICD-10-CM | POA: Diagnosis not present

## 2017-03-22 DIAGNOSIS — R5383 Other fatigue: Secondary | ICD-10-CM | POA: Diagnosis not present

## 2017-03-22 DIAGNOSIS — J4540 Moderate persistent asthma, uncomplicated: Secondary | ICD-10-CM | POA: Diagnosis not present

## 2017-03-22 DIAGNOSIS — J301 Allergic rhinitis due to pollen: Secondary | ICD-10-CM | POA: Diagnosis not present

## 2017-03-28 DIAGNOSIS — M1711 Unilateral primary osteoarthritis, right knee: Secondary | ICD-10-CM | POA: Diagnosis not present

## 2017-03-30 DIAGNOSIS — J449 Chronic obstructive pulmonary disease, unspecified: Secondary | ICD-10-CM | POA: Diagnosis not present

## 2017-03-31 DIAGNOSIS — I10 Essential (primary) hypertension: Secondary | ICD-10-CM | POA: Diagnosis not present

## 2017-03-31 DIAGNOSIS — M2550 Pain in unspecified joint: Secondary | ICD-10-CM | POA: Diagnosis not present

## 2017-04-20 DIAGNOSIS — J449 Chronic obstructive pulmonary disease, unspecified: Secondary | ICD-10-CM | POA: Diagnosis not present

## 2017-04-25 DIAGNOSIS — Z89611 Acquired absence of right leg above knee: Secondary | ICD-10-CM | POA: Diagnosis not present

## 2017-04-29 DIAGNOSIS — J309 Allergic rhinitis, unspecified: Secondary | ICD-10-CM | POA: Diagnosis not present

## 2017-04-29 DIAGNOSIS — E785 Hyperlipidemia, unspecified: Secondary | ICD-10-CM | POA: Diagnosis not present

## 2017-04-29 DIAGNOSIS — R739 Hyperglycemia, unspecified: Secondary | ICD-10-CM | POA: Diagnosis not present

## 2017-04-29 DIAGNOSIS — Z79899 Other long term (current) drug therapy: Secondary | ICD-10-CM | POA: Diagnosis not present

## 2017-04-29 DIAGNOSIS — I10 Essential (primary) hypertension: Secondary | ICD-10-CM | POA: Diagnosis not present

## 2017-04-30 DIAGNOSIS — J449 Chronic obstructive pulmonary disease, unspecified: Secondary | ICD-10-CM | POA: Diagnosis not present

## 2017-05-04 DIAGNOSIS — G9050 Complex regional pain syndrome I, unspecified: Secondary | ICD-10-CM | POA: Diagnosis not present

## 2017-05-04 DIAGNOSIS — R2689 Other abnormalities of gait and mobility: Secondary | ICD-10-CM | POA: Diagnosis not present

## 2017-05-04 DIAGNOSIS — I509 Heart failure, unspecified: Secondary | ICD-10-CM | POA: Diagnosis not present

## 2017-05-04 DIAGNOSIS — Z8673 Personal history of transient ischemic attack (TIA), and cerebral infarction without residual deficits: Secondary | ICD-10-CM | POA: Diagnosis not present

## 2017-05-04 DIAGNOSIS — J45909 Unspecified asthma, uncomplicated: Secondary | ICD-10-CM | POA: Diagnosis not present

## 2017-05-04 DIAGNOSIS — G4733 Obstructive sleep apnea (adult) (pediatric): Secondary | ICD-10-CM | POA: Diagnosis not present

## 2017-05-04 DIAGNOSIS — Z9981 Dependence on supplemental oxygen: Secondary | ICD-10-CM | POA: Diagnosis not present

## 2017-05-04 DIAGNOSIS — J440 Chronic obstructive pulmonary disease with acute lower respiratory infection: Secondary | ICD-10-CM | POA: Diagnosis not present

## 2017-05-04 DIAGNOSIS — Z87440 Personal history of urinary (tract) infections: Secondary | ICD-10-CM | POA: Diagnosis not present

## 2017-05-04 DIAGNOSIS — I10 Essential (primary) hypertension: Secondary | ICD-10-CM | POA: Diagnosis not present

## 2017-05-09 DIAGNOSIS — M1711 Unilateral primary osteoarthritis, right knee: Secondary | ICD-10-CM | POA: Diagnosis not present

## 2017-05-11 DIAGNOSIS — Z8673 Personal history of transient ischemic attack (TIA), and cerebral infarction without residual deficits: Secondary | ICD-10-CM | POA: Diagnosis not present

## 2017-05-11 DIAGNOSIS — Z87440 Personal history of urinary (tract) infections: Secondary | ICD-10-CM | POA: Diagnosis not present

## 2017-05-11 DIAGNOSIS — M25561 Pain in right knee: Secondary | ICD-10-CM | POA: Diagnosis not present

## 2017-05-11 DIAGNOSIS — R2689 Other abnormalities of gait and mobility: Secondary | ICD-10-CM | POA: Diagnosis not present

## 2017-05-11 DIAGNOSIS — J440 Chronic obstructive pulmonary disease with acute lower respiratory infection: Secondary | ICD-10-CM | POA: Diagnosis not present

## 2017-05-11 DIAGNOSIS — Z9981 Dependence on supplemental oxygen: Secondary | ICD-10-CM | POA: Diagnosis not present

## 2017-05-11 DIAGNOSIS — G9050 Complex regional pain syndrome I, unspecified: Secondary | ICD-10-CM | POA: Diagnosis not present

## 2017-05-11 DIAGNOSIS — J45909 Unspecified asthma, uncomplicated: Secondary | ICD-10-CM | POA: Diagnosis not present

## 2017-05-11 DIAGNOSIS — G4733 Obstructive sleep apnea (adult) (pediatric): Secondary | ICD-10-CM | POA: Diagnosis not present

## 2017-05-11 DIAGNOSIS — I10 Essential (primary) hypertension: Secondary | ICD-10-CM | POA: Diagnosis not present

## 2017-05-11 DIAGNOSIS — I509 Heart failure, unspecified: Secondary | ICD-10-CM | POA: Diagnosis not present

## 2017-05-13 DIAGNOSIS — J449 Chronic obstructive pulmonary disease, unspecified: Secondary | ICD-10-CM | POA: Diagnosis not present

## 2017-05-20 DIAGNOSIS — Z1231 Encounter for screening mammogram for malignant neoplasm of breast: Secondary | ICD-10-CM | POA: Diagnosis not present

## 2017-05-20 DIAGNOSIS — Z9181 History of falling: Secondary | ICD-10-CM | POA: Diagnosis not present

## 2017-05-20 DIAGNOSIS — N959 Unspecified menopausal and perimenopausal disorder: Secondary | ICD-10-CM | POA: Diagnosis not present

## 2017-05-20 DIAGNOSIS — Z1389 Encounter for screening for other disorder: Secondary | ICD-10-CM | POA: Diagnosis not present

## 2017-05-20 DIAGNOSIS — Z1211 Encounter for screening for malignant neoplasm of colon: Secondary | ICD-10-CM | POA: Diagnosis not present

## 2017-05-20 DIAGNOSIS — Z0000 Encounter for general adult medical examination without abnormal findings: Secondary | ICD-10-CM | POA: Diagnosis not present

## 2017-05-20 DIAGNOSIS — E785 Hyperlipidemia, unspecified: Secondary | ICD-10-CM | POA: Diagnosis not present

## 2017-05-23 DIAGNOSIS — M1711 Unilateral primary osteoarthritis, right knee: Secondary | ICD-10-CM | POA: Diagnosis not present

## 2017-05-26 DIAGNOSIS — M1711 Unilateral primary osteoarthritis, right knee: Secondary | ICD-10-CM | POA: Diagnosis not present

## 2017-05-26 DIAGNOSIS — R262 Difficulty in walking, not elsewhere classified: Secondary | ICD-10-CM | POA: Diagnosis not present

## 2017-05-26 DIAGNOSIS — Z89611 Acquired absence of right leg above knee: Secondary | ICD-10-CM | POA: Diagnosis not present

## 2017-05-26 DIAGNOSIS — M25461 Effusion, right knee: Secondary | ICD-10-CM | POA: Diagnosis not present

## 2017-05-26 DIAGNOSIS — S83249D Other tear of medial meniscus, current injury, unspecified knee, subsequent encounter: Secondary | ICD-10-CM | POA: Diagnosis not present

## 2017-05-26 DIAGNOSIS — M25651 Stiffness of right hip, not elsewhere classified: Secondary | ICD-10-CM | POA: Diagnosis not present

## 2017-05-26 DIAGNOSIS — M25661 Stiffness of right knee, not elsewhere classified: Secondary | ICD-10-CM | POA: Diagnosis not present

## 2017-05-26 DIAGNOSIS — M25561 Pain in right knee: Secondary | ICD-10-CM | POA: Diagnosis not present

## 2017-05-26 DIAGNOSIS — M25652 Stiffness of left hip, not elsewhere classified: Secondary | ICD-10-CM | POA: Diagnosis not present

## 2017-05-26 DIAGNOSIS — M6281 Muscle weakness (generalized): Secondary | ICD-10-CM | POA: Diagnosis not present

## 2017-05-30 DIAGNOSIS — R739 Hyperglycemia, unspecified: Secondary | ICD-10-CM | POA: Diagnosis not present

## 2017-05-30 DIAGNOSIS — J449 Chronic obstructive pulmonary disease, unspecified: Secondary | ICD-10-CM | POA: Diagnosis not present

## 2017-05-30 DIAGNOSIS — E785 Hyperlipidemia, unspecified: Secondary | ICD-10-CM | POA: Diagnosis not present

## 2017-05-30 DIAGNOSIS — I10 Essential (primary) hypertension: Secondary | ICD-10-CM | POA: Diagnosis not present

## 2017-05-30 DIAGNOSIS — G4700 Insomnia, unspecified: Secondary | ICD-10-CM | POA: Diagnosis not present

## 2017-05-31 DIAGNOSIS — R262 Difficulty in walking, not elsewhere classified: Secondary | ICD-10-CM | POA: Diagnosis not present

## 2017-05-31 DIAGNOSIS — M6281 Muscle weakness (generalized): Secondary | ICD-10-CM | POA: Diagnosis not present

## 2017-05-31 DIAGNOSIS — M25461 Effusion, right knee: Secondary | ICD-10-CM | POA: Diagnosis not present

## 2017-05-31 DIAGNOSIS — M25651 Stiffness of right hip, not elsewhere classified: Secondary | ICD-10-CM | POA: Diagnosis not present

## 2017-05-31 DIAGNOSIS — M25561 Pain in right knee: Secondary | ICD-10-CM | POA: Diagnosis not present

## 2017-05-31 DIAGNOSIS — M25652 Stiffness of left hip, not elsewhere classified: Secondary | ICD-10-CM | POA: Diagnosis not present

## 2017-05-31 DIAGNOSIS — M1711 Unilateral primary osteoarthritis, right knee: Secondary | ICD-10-CM | POA: Diagnosis not present

## 2017-05-31 DIAGNOSIS — S83249D Other tear of medial meniscus, current injury, unspecified knee, subsequent encounter: Secondary | ICD-10-CM | POA: Diagnosis not present

## 2017-05-31 DIAGNOSIS — M25661 Stiffness of right knee, not elsewhere classified: Secondary | ICD-10-CM | POA: Diagnosis not present

## 2017-06-03 DIAGNOSIS — M25652 Stiffness of left hip, not elsewhere classified: Secondary | ICD-10-CM | POA: Diagnosis not present

## 2017-06-03 DIAGNOSIS — R262 Difficulty in walking, not elsewhere classified: Secondary | ICD-10-CM | POA: Diagnosis not present

## 2017-06-03 DIAGNOSIS — S83249D Other tear of medial meniscus, current injury, unspecified knee, subsequent encounter: Secondary | ICD-10-CM | POA: Diagnosis not present

## 2017-06-03 DIAGNOSIS — M25561 Pain in right knee: Secondary | ICD-10-CM | POA: Diagnosis not present

## 2017-06-03 DIAGNOSIS — M25461 Effusion, right knee: Secondary | ICD-10-CM | POA: Diagnosis not present

## 2017-06-03 DIAGNOSIS — M25651 Stiffness of right hip, not elsewhere classified: Secondary | ICD-10-CM | POA: Diagnosis not present

## 2017-06-03 DIAGNOSIS — M25661 Stiffness of right knee, not elsewhere classified: Secondary | ICD-10-CM | POA: Diagnosis not present

## 2017-06-03 DIAGNOSIS — M1711 Unilateral primary osteoarthritis, right knee: Secondary | ICD-10-CM | POA: Diagnosis not present

## 2017-06-03 DIAGNOSIS — M6281 Muscle weakness (generalized): Secondary | ICD-10-CM | POA: Diagnosis not present

## 2017-06-07 DIAGNOSIS — M25461 Effusion, right knee: Secondary | ICD-10-CM | POA: Diagnosis not present

## 2017-06-07 DIAGNOSIS — M25661 Stiffness of right knee, not elsewhere classified: Secondary | ICD-10-CM | POA: Diagnosis not present

## 2017-06-07 DIAGNOSIS — R262 Difficulty in walking, not elsewhere classified: Secondary | ICD-10-CM | POA: Diagnosis not present

## 2017-06-07 DIAGNOSIS — M6281 Muscle weakness (generalized): Secondary | ICD-10-CM | POA: Diagnosis not present

## 2017-06-07 DIAGNOSIS — M25652 Stiffness of left hip, not elsewhere classified: Secondary | ICD-10-CM | POA: Diagnosis not present

## 2017-06-07 DIAGNOSIS — J449 Chronic obstructive pulmonary disease, unspecified: Secondary | ICD-10-CM | POA: Diagnosis not present

## 2017-06-07 DIAGNOSIS — M25561 Pain in right knee: Secondary | ICD-10-CM | POA: Diagnosis not present

## 2017-06-07 DIAGNOSIS — S83249D Other tear of medial meniscus, current injury, unspecified knee, subsequent encounter: Secondary | ICD-10-CM | POA: Diagnosis not present

## 2017-06-07 DIAGNOSIS — M1711 Unilateral primary osteoarthritis, right knee: Secondary | ICD-10-CM | POA: Diagnosis not present

## 2017-06-07 DIAGNOSIS — M25651 Stiffness of right hip, not elsewhere classified: Secondary | ICD-10-CM | POA: Diagnosis not present

## 2017-06-14 DIAGNOSIS — M25661 Stiffness of right knee, not elsewhere classified: Secondary | ICD-10-CM | POA: Diagnosis not present

## 2017-06-14 DIAGNOSIS — M1711 Unilateral primary osteoarthritis, right knee: Secondary | ICD-10-CM | POA: Diagnosis not present

## 2017-06-14 DIAGNOSIS — M25561 Pain in right knee: Secondary | ICD-10-CM | POA: Diagnosis not present

## 2017-06-14 DIAGNOSIS — M6281 Muscle weakness (generalized): Secondary | ICD-10-CM | POA: Diagnosis not present

## 2017-06-14 DIAGNOSIS — M25652 Stiffness of left hip, not elsewhere classified: Secondary | ICD-10-CM | POA: Diagnosis not present

## 2017-06-14 DIAGNOSIS — M25651 Stiffness of right hip, not elsewhere classified: Secondary | ICD-10-CM | POA: Diagnosis not present

## 2017-06-14 DIAGNOSIS — R262 Difficulty in walking, not elsewhere classified: Secondary | ICD-10-CM | POA: Diagnosis not present

## 2017-06-14 DIAGNOSIS — S83249D Other tear of medial meniscus, current injury, unspecified knee, subsequent encounter: Secondary | ICD-10-CM | POA: Diagnosis not present

## 2017-06-14 DIAGNOSIS — M25461 Effusion, right knee: Secondary | ICD-10-CM | POA: Diagnosis not present

## 2017-06-16 DIAGNOSIS — M1711 Unilateral primary osteoarthritis, right knee: Secondary | ICD-10-CM | POA: Diagnosis not present

## 2017-06-16 DIAGNOSIS — M25651 Stiffness of right hip, not elsewhere classified: Secondary | ICD-10-CM | POA: Diagnosis not present

## 2017-06-16 DIAGNOSIS — M25661 Stiffness of right knee, not elsewhere classified: Secondary | ICD-10-CM | POA: Diagnosis not present

## 2017-06-16 DIAGNOSIS — R262 Difficulty in walking, not elsewhere classified: Secondary | ICD-10-CM | POA: Diagnosis not present

## 2017-06-16 DIAGNOSIS — M25461 Effusion, right knee: Secondary | ICD-10-CM | POA: Diagnosis not present

## 2017-06-16 DIAGNOSIS — M6281 Muscle weakness (generalized): Secondary | ICD-10-CM | POA: Diagnosis not present

## 2017-06-16 DIAGNOSIS — S83249D Other tear of medial meniscus, current injury, unspecified knee, subsequent encounter: Secondary | ICD-10-CM | POA: Diagnosis not present

## 2017-06-16 DIAGNOSIS — M25652 Stiffness of left hip, not elsewhere classified: Secondary | ICD-10-CM | POA: Diagnosis not present

## 2017-06-16 DIAGNOSIS — M25561 Pain in right knee: Secondary | ICD-10-CM | POA: Diagnosis not present

## 2017-06-21 DIAGNOSIS — M25461 Effusion, right knee: Secondary | ICD-10-CM | POA: Diagnosis not present

## 2017-06-21 DIAGNOSIS — S83249D Other tear of medial meniscus, current injury, unspecified knee, subsequent encounter: Secondary | ICD-10-CM | POA: Diagnosis not present

## 2017-06-21 DIAGNOSIS — M1711 Unilateral primary osteoarthritis, right knee: Secondary | ICD-10-CM | POA: Diagnosis not present

## 2017-06-21 DIAGNOSIS — M25651 Stiffness of right hip, not elsewhere classified: Secondary | ICD-10-CM | POA: Diagnosis not present

## 2017-06-21 DIAGNOSIS — M25652 Stiffness of left hip, not elsewhere classified: Secondary | ICD-10-CM | POA: Diagnosis not present

## 2017-06-21 DIAGNOSIS — M25561 Pain in right knee: Secondary | ICD-10-CM | POA: Diagnosis not present

## 2017-06-21 DIAGNOSIS — R262 Difficulty in walking, not elsewhere classified: Secondary | ICD-10-CM | POA: Diagnosis not present

## 2017-06-21 DIAGNOSIS — M6281 Muscle weakness (generalized): Secondary | ICD-10-CM | POA: Diagnosis not present

## 2017-06-21 DIAGNOSIS — M25661 Stiffness of right knee, not elsewhere classified: Secondary | ICD-10-CM | POA: Diagnosis not present

## 2017-06-22 DIAGNOSIS — H25813 Combined forms of age-related cataract, bilateral: Secondary | ICD-10-CM | POA: Diagnosis not present

## 2017-06-23 DIAGNOSIS — M25651 Stiffness of right hip, not elsewhere classified: Secondary | ICD-10-CM | POA: Diagnosis not present

## 2017-06-23 DIAGNOSIS — M25661 Stiffness of right knee, not elsewhere classified: Secondary | ICD-10-CM | POA: Diagnosis not present

## 2017-06-23 DIAGNOSIS — M25461 Effusion, right knee: Secondary | ICD-10-CM | POA: Diagnosis not present

## 2017-06-23 DIAGNOSIS — M25652 Stiffness of left hip, not elsewhere classified: Secondary | ICD-10-CM | POA: Diagnosis not present

## 2017-06-23 DIAGNOSIS — M6281 Muscle weakness (generalized): Secondary | ICD-10-CM | POA: Diagnosis not present

## 2017-06-23 DIAGNOSIS — M1711 Unilateral primary osteoarthritis, right knee: Secondary | ICD-10-CM | POA: Diagnosis not present

## 2017-06-23 DIAGNOSIS — S83249D Other tear of medial meniscus, current injury, unspecified knee, subsequent encounter: Secondary | ICD-10-CM | POA: Diagnosis not present

## 2017-06-23 DIAGNOSIS — R262 Difficulty in walking, not elsewhere classified: Secondary | ICD-10-CM | POA: Diagnosis not present

## 2017-06-23 DIAGNOSIS — M25561 Pain in right knee: Secondary | ICD-10-CM | POA: Diagnosis not present

## 2017-06-25 DIAGNOSIS — Z89611 Acquired absence of right leg above knee: Secondary | ICD-10-CM | POA: Diagnosis not present

## 2017-06-27 DIAGNOSIS — M25461 Effusion, right knee: Secondary | ICD-10-CM | POA: Diagnosis not present

## 2017-06-27 DIAGNOSIS — M25651 Stiffness of right hip, not elsewhere classified: Secondary | ICD-10-CM | POA: Diagnosis not present

## 2017-06-27 DIAGNOSIS — M1711 Unilateral primary osteoarthritis, right knee: Secondary | ICD-10-CM | POA: Diagnosis not present

## 2017-06-27 DIAGNOSIS — M25652 Stiffness of left hip, not elsewhere classified: Secondary | ICD-10-CM | POA: Diagnosis not present

## 2017-06-27 DIAGNOSIS — S83249D Other tear of medial meniscus, current injury, unspecified knee, subsequent encounter: Secondary | ICD-10-CM | POA: Diagnosis not present

## 2017-06-27 DIAGNOSIS — M25561 Pain in right knee: Secondary | ICD-10-CM | POA: Diagnosis not present

## 2017-06-27 DIAGNOSIS — M25661 Stiffness of right knee, not elsewhere classified: Secondary | ICD-10-CM | POA: Diagnosis not present

## 2017-06-27 DIAGNOSIS — R262 Difficulty in walking, not elsewhere classified: Secondary | ICD-10-CM | POA: Diagnosis not present

## 2017-06-27 DIAGNOSIS — M6281 Muscle weakness (generalized): Secondary | ICD-10-CM | POA: Diagnosis not present

## 2017-06-30 DIAGNOSIS — Z23 Encounter for immunization: Secondary | ICD-10-CM | POA: Diagnosis not present

## 2017-06-30 DIAGNOSIS — Z79899 Other long term (current) drug therapy: Secondary | ICD-10-CM | POA: Diagnosis not present

## 2017-06-30 DIAGNOSIS — R739 Hyperglycemia, unspecified: Secondary | ICD-10-CM | POA: Diagnosis not present

## 2017-06-30 DIAGNOSIS — J449 Chronic obstructive pulmonary disease, unspecified: Secondary | ICD-10-CM | POA: Diagnosis not present

## 2017-07-04 DIAGNOSIS — I509 Heart failure, unspecified: Secondary | ICD-10-CM | POA: Diagnosis not present

## 2017-07-04 DIAGNOSIS — J45909 Unspecified asthma, uncomplicated: Secondary | ICD-10-CM | POA: Diagnosis not present

## 2017-07-04 DIAGNOSIS — J301 Allergic rhinitis due to pollen: Secondary | ICD-10-CM | POA: Diagnosis not present

## 2017-07-04 DIAGNOSIS — G9050 Complex regional pain syndrome I, unspecified: Secondary | ICD-10-CM | POA: Diagnosis not present

## 2017-07-04 DIAGNOSIS — J440 Chronic obstructive pulmonary disease with acute lower respiratory infection: Secondary | ICD-10-CM | POA: Diagnosis not present

## 2017-07-04 DIAGNOSIS — Z87440 Personal history of urinary (tract) infections: Secondary | ICD-10-CM | POA: Diagnosis not present

## 2017-07-04 DIAGNOSIS — J4540 Moderate persistent asthma, uncomplicated: Secondary | ICD-10-CM | POA: Diagnosis not present

## 2017-07-04 DIAGNOSIS — R2689 Other abnormalities of gait and mobility: Secondary | ICD-10-CM | POA: Diagnosis not present

## 2017-07-04 DIAGNOSIS — I10 Essential (primary) hypertension: Secondary | ICD-10-CM | POA: Diagnosis not present

## 2017-07-04 DIAGNOSIS — G4733 Obstructive sleep apnea (adult) (pediatric): Secondary | ICD-10-CM | POA: Diagnosis not present

## 2017-07-04 DIAGNOSIS — Z9981 Dependence on supplemental oxygen: Secondary | ICD-10-CM | POA: Diagnosis not present

## 2017-07-04 DIAGNOSIS — Z8673 Personal history of transient ischemic attack (TIA), and cerebral infarction without residual deficits: Secondary | ICD-10-CM | POA: Diagnosis not present

## 2017-07-06 DIAGNOSIS — R2689 Other abnormalities of gait and mobility: Secondary | ICD-10-CM | POA: Diagnosis not present

## 2017-07-06 DIAGNOSIS — J45909 Unspecified asthma, uncomplicated: Secondary | ICD-10-CM | POA: Diagnosis not present

## 2017-07-06 DIAGNOSIS — G4733 Obstructive sleep apnea (adult) (pediatric): Secondary | ICD-10-CM | POA: Diagnosis not present

## 2017-07-06 DIAGNOSIS — G9050 Complex regional pain syndrome I, unspecified: Secondary | ICD-10-CM | POA: Diagnosis not present

## 2017-07-06 DIAGNOSIS — I509 Heart failure, unspecified: Secondary | ICD-10-CM | POA: Diagnosis not present

## 2017-07-06 DIAGNOSIS — Z9981 Dependence on supplemental oxygen: Secondary | ICD-10-CM | POA: Diagnosis not present

## 2017-07-06 DIAGNOSIS — Z87440 Personal history of urinary (tract) infections: Secondary | ICD-10-CM | POA: Diagnosis not present

## 2017-07-06 DIAGNOSIS — J440 Chronic obstructive pulmonary disease with acute lower respiratory infection: Secondary | ICD-10-CM | POA: Diagnosis not present

## 2017-07-06 DIAGNOSIS — Z8673 Personal history of transient ischemic attack (TIA), and cerebral infarction without residual deficits: Secondary | ICD-10-CM | POA: Diagnosis not present

## 2017-07-06 DIAGNOSIS — I10 Essential (primary) hypertension: Secondary | ICD-10-CM | POA: Diagnosis not present

## 2017-07-12 DIAGNOSIS — M25561 Pain in right knee: Secondary | ICD-10-CM | POA: Diagnosis not present

## 2017-07-12 DIAGNOSIS — M25651 Stiffness of right hip, not elsewhere classified: Secondary | ICD-10-CM | POA: Diagnosis not present

## 2017-07-12 DIAGNOSIS — M1711 Unilateral primary osteoarthritis, right knee: Secondary | ICD-10-CM | POA: Diagnosis not present

## 2017-07-12 DIAGNOSIS — M25652 Stiffness of left hip, not elsewhere classified: Secondary | ICD-10-CM | POA: Diagnosis not present

## 2017-07-12 DIAGNOSIS — M25661 Stiffness of right knee, not elsewhere classified: Secondary | ICD-10-CM | POA: Diagnosis not present

## 2017-07-12 DIAGNOSIS — M25461 Effusion, right knee: Secondary | ICD-10-CM | POA: Diagnosis not present

## 2017-07-12 DIAGNOSIS — M6281 Muscle weakness (generalized): Secondary | ICD-10-CM | POA: Diagnosis not present

## 2017-07-12 DIAGNOSIS — R262 Difficulty in walking, not elsewhere classified: Secondary | ICD-10-CM | POA: Diagnosis not present

## 2017-07-12 DIAGNOSIS — S83249D Other tear of medial meniscus, current injury, unspecified knee, subsequent encounter: Secondary | ICD-10-CM | POA: Diagnosis not present

## 2017-07-18 DIAGNOSIS — J301 Allergic rhinitis due to pollen: Secondary | ICD-10-CM | POA: Diagnosis not present

## 2017-07-18 DIAGNOSIS — Z885 Allergy status to narcotic agent status: Secondary | ICD-10-CM | POA: Diagnosis not present

## 2017-07-18 DIAGNOSIS — Z79899 Other long term (current) drug therapy: Secondary | ICD-10-CM | POA: Diagnosis not present

## 2017-07-18 DIAGNOSIS — R0602 Shortness of breath: Secondary | ICD-10-CM | POA: Diagnosis not present

## 2017-07-18 DIAGNOSIS — J449 Chronic obstructive pulmonary disease, unspecified: Secondary | ICD-10-CM | POA: Diagnosis not present

## 2017-07-18 DIAGNOSIS — J9690 Respiratory failure, unspecified, unspecified whether with hypoxia or hypercapnia: Secondary | ICD-10-CM | POA: Diagnosis not present

## 2017-07-18 DIAGNOSIS — Z9981 Dependence on supplemental oxygen: Secondary | ICD-10-CM | POA: Diagnosis not present

## 2017-07-18 DIAGNOSIS — J9621 Acute and chronic respiratory failure with hypoxia: Secondary | ICD-10-CM | POA: Diagnosis not present

## 2017-07-18 DIAGNOSIS — Z881 Allergy status to other antibiotic agents status: Secondary | ICD-10-CM | POA: Diagnosis not present

## 2017-07-18 DIAGNOSIS — Z87891 Personal history of nicotine dependence: Secondary | ICD-10-CM | POA: Diagnosis not present

## 2017-07-18 DIAGNOSIS — J4531 Mild persistent asthma with (acute) exacerbation: Secondary | ICD-10-CM | POA: Diagnosis not present

## 2017-07-18 DIAGNOSIS — G4733 Obstructive sleep apnea (adult) (pediatric): Secondary | ICD-10-CM | POA: Diagnosis not present

## 2017-07-18 DIAGNOSIS — J441 Chronic obstructive pulmonary disease with (acute) exacerbation: Secondary | ICD-10-CM | POA: Diagnosis not present

## 2017-07-18 DIAGNOSIS — J9600 Acute respiratory failure, unspecified whether with hypoxia or hypercapnia: Secondary | ICD-10-CM | POA: Diagnosis not present

## 2017-07-18 DIAGNOSIS — Z888 Allergy status to other drugs, medicaments and biological substances status: Secondary | ICD-10-CM | POA: Diagnosis not present

## 2017-07-26 DIAGNOSIS — I10 Essential (primary) hypertension: Secondary | ICD-10-CM | POA: Diagnosis not present

## 2017-07-26 DIAGNOSIS — Z89611 Acquired absence of right leg above knee: Secondary | ICD-10-CM | POA: Diagnosis not present

## 2017-07-26 DIAGNOSIS — Z79899 Other long term (current) drug therapy: Secondary | ICD-10-CM | POA: Diagnosis not present

## 2017-07-26 DIAGNOSIS — E785 Hyperlipidemia, unspecified: Secondary | ICD-10-CM | POA: Diagnosis not present

## 2017-07-26 DIAGNOSIS — R739 Hyperglycemia, unspecified: Secondary | ICD-10-CM | POA: Diagnosis not present

## 2017-07-28 DIAGNOSIS — M1711 Unilateral primary osteoarthritis, right knee: Secondary | ICD-10-CM | POA: Diagnosis not present

## 2017-07-29 DIAGNOSIS — G4733 Obstructive sleep apnea (adult) (pediatric): Secondary | ICD-10-CM | POA: Diagnosis not present

## 2017-07-29 DIAGNOSIS — Z89612 Acquired absence of left leg above knee: Secondary | ICD-10-CM | POA: Diagnosis not present

## 2017-07-29 DIAGNOSIS — M1991 Primary osteoarthritis, unspecified site: Secondary | ICD-10-CM | POA: Diagnosis not present

## 2017-07-29 DIAGNOSIS — I110 Hypertensive heart disease with heart failure: Secondary | ICD-10-CM | POA: Diagnosis not present

## 2017-07-29 DIAGNOSIS — Z9981 Dependence on supplemental oxygen: Secondary | ICD-10-CM | POA: Diagnosis not present

## 2017-07-29 DIAGNOSIS — Z87440 Personal history of urinary (tract) infections: Secondary | ICD-10-CM | POA: Diagnosis not present

## 2017-07-29 DIAGNOSIS — Z8673 Personal history of transient ischemic attack (TIA), and cerebral infarction without residual deficits: Secondary | ICD-10-CM | POA: Diagnosis not present

## 2017-07-29 DIAGNOSIS — Z993 Dependence on wheelchair: Secondary | ICD-10-CM | POA: Diagnosis not present

## 2017-07-29 DIAGNOSIS — Z9181 History of falling: Secondary | ICD-10-CM | POA: Diagnosis not present

## 2017-07-29 DIAGNOSIS — J301 Allergic rhinitis due to pollen: Secondary | ICD-10-CM | POA: Diagnosis not present

## 2017-07-29 DIAGNOSIS — J4531 Mild persistent asthma with (acute) exacerbation: Secondary | ICD-10-CM | POA: Diagnosis not present

## 2017-07-29 DIAGNOSIS — I5032 Chronic diastolic (congestive) heart failure: Secondary | ICD-10-CM | POA: Diagnosis not present

## 2017-07-29 DIAGNOSIS — J9611 Chronic respiratory failure with hypoxia: Secondary | ICD-10-CM | POA: Diagnosis not present

## 2017-07-30 DIAGNOSIS — J449 Chronic obstructive pulmonary disease, unspecified: Secondary | ICD-10-CM | POA: Diagnosis not present

## 2017-08-02 DIAGNOSIS — Z89612 Acquired absence of left leg above knee: Secondary | ICD-10-CM | POA: Diagnosis not present

## 2017-08-02 DIAGNOSIS — Z9981 Dependence on supplemental oxygen: Secondary | ICD-10-CM | POA: Diagnosis not present

## 2017-08-02 DIAGNOSIS — Z8673 Personal history of transient ischemic attack (TIA), and cerebral infarction without residual deficits: Secondary | ICD-10-CM | POA: Diagnosis not present

## 2017-08-02 DIAGNOSIS — G4733 Obstructive sleep apnea (adult) (pediatric): Secondary | ICD-10-CM | POA: Diagnosis not present

## 2017-08-02 DIAGNOSIS — I5032 Chronic diastolic (congestive) heart failure: Secondary | ICD-10-CM | POA: Diagnosis not present

## 2017-08-02 DIAGNOSIS — Z993 Dependence on wheelchair: Secondary | ICD-10-CM | POA: Diagnosis not present

## 2017-08-02 DIAGNOSIS — Z9181 History of falling: Secondary | ICD-10-CM | POA: Diagnosis not present

## 2017-08-02 DIAGNOSIS — J9611 Chronic respiratory failure with hypoxia: Secondary | ICD-10-CM | POA: Diagnosis not present

## 2017-08-02 DIAGNOSIS — Z87440 Personal history of urinary (tract) infections: Secondary | ICD-10-CM | POA: Diagnosis not present

## 2017-08-02 DIAGNOSIS — I110 Hypertensive heart disease with heart failure: Secondary | ICD-10-CM | POA: Diagnosis not present

## 2017-08-02 DIAGNOSIS — M1991 Primary osteoarthritis, unspecified site: Secondary | ICD-10-CM | POA: Diagnosis not present

## 2017-08-02 DIAGNOSIS — J4531 Mild persistent asthma with (acute) exacerbation: Secondary | ICD-10-CM | POA: Diagnosis not present

## 2017-08-02 DIAGNOSIS — J301 Allergic rhinitis due to pollen: Secondary | ICD-10-CM | POA: Diagnosis not present

## 2017-08-03 DIAGNOSIS — M1711 Unilateral primary osteoarthritis, right knee: Secondary | ICD-10-CM | POA: Diagnosis not present

## 2017-08-03 DIAGNOSIS — M25561 Pain in right knee: Secondary | ICD-10-CM | POA: Diagnosis not present

## 2017-08-04 DIAGNOSIS — Z993 Dependence on wheelchair: Secondary | ICD-10-CM | POA: Diagnosis not present

## 2017-08-04 DIAGNOSIS — M1991 Primary osteoarthritis, unspecified site: Secondary | ICD-10-CM | POA: Diagnosis not present

## 2017-08-04 DIAGNOSIS — I5032 Chronic diastolic (congestive) heart failure: Secondary | ICD-10-CM | POA: Diagnosis not present

## 2017-08-04 DIAGNOSIS — G4733 Obstructive sleep apnea (adult) (pediatric): Secondary | ICD-10-CM | POA: Diagnosis not present

## 2017-08-04 DIAGNOSIS — I110 Hypertensive heart disease with heart failure: Secondary | ICD-10-CM | POA: Diagnosis not present

## 2017-08-04 DIAGNOSIS — Z89612 Acquired absence of left leg above knee: Secondary | ICD-10-CM | POA: Diagnosis not present

## 2017-08-04 DIAGNOSIS — Z9981 Dependence on supplemental oxygen: Secondary | ICD-10-CM | POA: Diagnosis not present

## 2017-08-04 DIAGNOSIS — J301 Allergic rhinitis due to pollen: Secondary | ICD-10-CM | POA: Diagnosis not present

## 2017-08-04 DIAGNOSIS — Z87440 Personal history of urinary (tract) infections: Secondary | ICD-10-CM | POA: Diagnosis not present

## 2017-08-04 DIAGNOSIS — J9611 Chronic respiratory failure with hypoxia: Secondary | ICD-10-CM | POA: Diagnosis not present

## 2017-08-04 DIAGNOSIS — J4531 Mild persistent asthma with (acute) exacerbation: Secondary | ICD-10-CM | POA: Diagnosis not present

## 2017-08-04 DIAGNOSIS — Z9181 History of falling: Secondary | ICD-10-CM | POA: Diagnosis not present

## 2017-08-04 DIAGNOSIS — Z8673 Personal history of transient ischemic attack (TIA), and cerebral infarction without residual deficits: Secondary | ICD-10-CM | POA: Diagnosis not present

## 2017-08-09 DIAGNOSIS — Z9181 History of falling: Secondary | ICD-10-CM | POA: Diagnosis not present

## 2017-08-09 DIAGNOSIS — I110 Hypertensive heart disease with heart failure: Secondary | ICD-10-CM | POA: Diagnosis not present

## 2017-08-09 DIAGNOSIS — J9611 Chronic respiratory failure with hypoxia: Secondary | ICD-10-CM | POA: Diagnosis not present

## 2017-08-09 DIAGNOSIS — J4531 Mild persistent asthma with (acute) exacerbation: Secondary | ICD-10-CM | POA: Diagnosis not present

## 2017-08-09 DIAGNOSIS — I5032 Chronic diastolic (congestive) heart failure: Secondary | ICD-10-CM | POA: Diagnosis not present

## 2017-08-09 DIAGNOSIS — G4733 Obstructive sleep apnea (adult) (pediatric): Secondary | ICD-10-CM | POA: Diagnosis not present

## 2017-08-09 DIAGNOSIS — J301 Allergic rhinitis due to pollen: Secondary | ICD-10-CM | POA: Diagnosis not present

## 2017-08-09 DIAGNOSIS — Z993 Dependence on wheelchair: Secondary | ICD-10-CM | POA: Diagnosis not present

## 2017-08-09 DIAGNOSIS — Z87440 Personal history of urinary (tract) infections: Secondary | ICD-10-CM | POA: Diagnosis not present

## 2017-08-09 DIAGNOSIS — Z8673 Personal history of transient ischemic attack (TIA), and cerebral infarction without residual deficits: Secondary | ICD-10-CM | POA: Diagnosis not present

## 2017-08-09 DIAGNOSIS — Z9981 Dependence on supplemental oxygen: Secondary | ICD-10-CM | POA: Diagnosis not present

## 2017-08-09 DIAGNOSIS — M1991 Primary osteoarthritis, unspecified site: Secondary | ICD-10-CM | POA: Diagnosis not present

## 2017-08-09 DIAGNOSIS — Z89612 Acquired absence of left leg above knee: Secondary | ICD-10-CM | POA: Diagnosis not present

## 2017-08-10 DIAGNOSIS — R069 Unspecified abnormalities of breathing: Secondary | ICD-10-CM | POA: Diagnosis not present

## 2017-08-10 DIAGNOSIS — G8194 Hemiplegia, unspecified affecting left nondominant side: Secondary | ICD-10-CM | POA: Diagnosis not present

## 2017-08-11 DIAGNOSIS — M1991 Primary osteoarthritis, unspecified site: Secondary | ICD-10-CM | POA: Diagnosis not present

## 2017-08-11 DIAGNOSIS — Z87440 Personal history of urinary (tract) infections: Secondary | ICD-10-CM | POA: Diagnosis not present

## 2017-08-11 DIAGNOSIS — J4531 Mild persistent asthma with (acute) exacerbation: Secondary | ICD-10-CM | POA: Diagnosis not present

## 2017-08-11 DIAGNOSIS — Z9981 Dependence on supplemental oxygen: Secondary | ICD-10-CM | POA: Diagnosis not present

## 2017-08-11 DIAGNOSIS — Z9181 History of falling: Secondary | ICD-10-CM | POA: Diagnosis not present

## 2017-08-11 DIAGNOSIS — I5032 Chronic diastolic (congestive) heart failure: Secondary | ICD-10-CM | POA: Diagnosis not present

## 2017-08-11 DIAGNOSIS — G4733 Obstructive sleep apnea (adult) (pediatric): Secondary | ICD-10-CM | POA: Diagnosis not present

## 2017-08-11 DIAGNOSIS — J9611 Chronic respiratory failure with hypoxia: Secondary | ICD-10-CM | POA: Diagnosis not present

## 2017-08-11 DIAGNOSIS — Z89612 Acquired absence of left leg above knee: Secondary | ICD-10-CM | POA: Diagnosis not present

## 2017-08-11 DIAGNOSIS — I110 Hypertensive heart disease with heart failure: Secondary | ICD-10-CM | POA: Diagnosis not present

## 2017-08-11 DIAGNOSIS — J449 Chronic obstructive pulmonary disease, unspecified: Secondary | ICD-10-CM | POA: Diagnosis not present

## 2017-08-11 DIAGNOSIS — Z993 Dependence on wheelchair: Secondary | ICD-10-CM | POA: Diagnosis not present

## 2017-08-11 DIAGNOSIS — Z8673 Personal history of transient ischemic attack (TIA), and cerebral infarction without residual deficits: Secondary | ICD-10-CM | POA: Diagnosis not present

## 2017-08-11 DIAGNOSIS — J301 Allergic rhinitis due to pollen: Secondary | ICD-10-CM | POA: Diagnosis not present

## 2017-08-15 DIAGNOSIS — G4700 Insomnia, unspecified: Secondary | ICD-10-CM | POA: Diagnosis not present

## 2017-08-15 DIAGNOSIS — J301 Allergic rhinitis due to pollen: Secondary | ICD-10-CM | POA: Diagnosis not present

## 2017-08-15 DIAGNOSIS — J4540 Moderate persistent asthma, uncomplicated: Secondary | ICD-10-CM | POA: Diagnosis not present

## 2017-08-25 DIAGNOSIS — Z89611 Acquired absence of right leg above knee: Secondary | ICD-10-CM | POA: Diagnosis not present

## 2017-08-25 DIAGNOSIS — J4531 Mild persistent asthma with (acute) exacerbation: Secondary | ICD-10-CM | POA: Diagnosis not present

## 2017-08-25 DIAGNOSIS — I5032 Chronic diastolic (congestive) heart failure: Secondary | ICD-10-CM | POA: Diagnosis not present

## 2017-08-25 DIAGNOSIS — Z87440 Personal history of urinary (tract) infections: Secondary | ICD-10-CM | POA: Diagnosis not present

## 2017-08-25 DIAGNOSIS — G4733 Obstructive sleep apnea (adult) (pediatric): Secondary | ICD-10-CM | POA: Diagnosis not present

## 2017-08-25 DIAGNOSIS — Z9981 Dependence on supplemental oxygen: Secondary | ICD-10-CM | POA: Diagnosis not present

## 2017-08-25 DIAGNOSIS — J301 Allergic rhinitis due to pollen: Secondary | ICD-10-CM | POA: Diagnosis not present

## 2017-08-25 DIAGNOSIS — Z89612 Acquired absence of left leg above knee: Secondary | ICD-10-CM | POA: Diagnosis not present

## 2017-08-25 DIAGNOSIS — Z8673 Personal history of transient ischemic attack (TIA), and cerebral infarction without residual deficits: Secondary | ICD-10-CM | POA: Diagnosis not present

## 2017-08-25 DIAGNOSIS — I110 Hypertensive heart disease with heart failure: Secondary | ICD-10-CM | POA: Diagnosis not present

## 2017-08-25 DIAGNOSIS — Z9181 History of falling: Secondary | ICD-10-CM | POA: Diagnosis not present

## 2017-08-25 DIAGNOSIS — M1991 Primary osteoarthritis, unspecified site: Secondary | ICD-10-CM | POA: Diagnosis not present

## 2017-08-25 DIAGNOSIS — Z993 Dependence on wheelchair: Secondary | ICD-10-CM | POA: Diagnosis not present

## 2017-08-25 DIAGNOSIS — J9611 Chronic respiratory failure with hypoxia: Secondary | ICD-10-CM | POA: Diagnosis not present

## 2017-08-30 DIAGNOSIS — J449 Chronic obstructive pulmonary disease, unspecified: Secondary | ICD-10-CM | POA: Diagnosis not present

## 2017-09-02 DIAGNOSIS — J4531 Mild persistent asthma with (acute) exacerbation: Secondary | ICD-10-CM | POA: Diagnosis not present

## 2017-09-02 DIAGNOSIS — M1991 Primary osteoarthritis, unspecified site: Secondary | ICD-10-CM | POA: Diagnosis not present

## 2017-09-02 DIAGNOSIS — Z9181 History of falling: Secondary | ICD-10-CM | POA: Diagnosis not present

## 2017-09-02 DIAGNOSIS — Z89612 Acquired absence of left leg above knee: Secondary | ICD-10-CM | POA: Diagnosis not present

## 2017-09-02 DIAGNOSIS — Z8673 Personal history of transient ischemic attack (TIA), and cerebral infarction without residual deficits: Secondary | ICD-10-CM | POA: Diagnosis not present

## 2017-09-02 DIAGNOSIS — I110 Hypertensive heart disease with heart failure: Secondary | ICD-10-CM | POA: Diagnosis not present

## 2017-09-02 DIAGNOSIS — J9611 Chronic respiratory failure with hypoxia: Secondary | ICD-10-CM | POA: Diagnosis not present

## 2017-09-02 DIAGNOSIS — Z9981 Dependence on supplemental oxygen: Secondary | ICD-10-CM | POA: Diagnosis not present

## 2017-09-02 DIAGNOSIS — G4733 Obstructive sleep apnea (adult) (pediatric): Secondary | ICD-10-CM | POA: Diagnosis not present

## 2017-09-02 DIAGNOSIS — I5032 Chronic diastolic (congestive) heart failure: Secondary | ICD-10-CM | POA: Diagnosis not present

## 2017-09-02 DIAGNOSIS — J301 Allergic rhinitis due to pollen: Secondary | ICD-10-CM | POA: Diagnosis not present

## 2017-09-02 DIAGNOSIS — Z87440 Personal history of urinary (tract) infections: Secondary | ICD-10-CM | POA: Diagnosis not present

## 2017-09-02 DIAGNOSIS — Z993 Dependence on wheelchair: Secondary | ICD-10-CM | POA: Diagnosis not present

## 2017-09-06 DIAGNOSIS — G4733 Obstructive sleep apnea (adult) (pediatric): Secondary | ICD-10-CM | POA: Diagnosis not present

## 2017-09-06 DIAGNOSIS — I110 Hypertensive heart disease with heart failure: Secondary | ICD-10-CM | POA: Diagnosis not present

## 2017-09-06 DIAGNOSIS — J9611 Chronic respiratory failure with hypoxia: Secondary | ICD-10-CM | POA: Diagnosis not present

## 2017-09-06 DIAGNOSIS — Z9981 Dependence on supplemental oxygen: Secondary | ICD-10-CM | POA: Diagnosis not present

## 2017-09-06 DIAGNOSIS — J301 Allergic rhinitis due to pollen: Secondary | ICD-10-CM | POA: Diagnosis not present

## 2017-09-06 DIAGNOSIS — J4531 Mild persistent asthma with (acute) exacerbation: Secondary | ICD-10-CM | POA: Diagnosis not present

## 2017-09-06 DIAGNOSIS — Z89612 Acquired absence of left leg above knee: Secondary | ICD-10-CM | POA: Diagnosis not present

## 2017-09-06 DIAGNOSIS — M1991 Primary osteoarthritis, unspecified site: Secondary | ICD-10-CM | POA: Diagnosis not present

## 2017-09-06 DIAGNOSIS — I5032 Chronic diastolic (congestive) heart failure: Secondary | ICD-10-CM | POA: Diagnosis not present

## 2017-09-06 DIAGNOSIS — Z993 Dependence on wheelchair: Secondary | ICD-10-CM | POA: Diagnosis not present

## 2017-09-06 DIAGNOSIS — Z8673 Personal history of transient ischemic attack (TIA), and cerebral infarction without residual deficits: Secondary | ICD-10-CM | POA: Diagnosis not present

## 2017-09-06 DIAGNOSIS — Z87440 Personal history of urinary (tract) infections: Secondary | ICD-10-CM | POA: Diagnosis not present

## 2017-09-06 DIAGNOSIS — Z9181 History of falling: Secondary | ICD-10-CM | POA: Diagnosis not present

## 2017-09-08 DIAGNOSIS — I110 Hypertensive heart disease with heart failure: Secondary | ICD-10-CM | POA: Diagnosis not present

## 2017-09-08 DIAGNOSIS — J301 Allergic rhinitis due to pollen: Secondary | ICD-10-CM | POA: Diagnosis not present

## 2017-09-08 DIAGNOSIS — Z87440 Personal history of urinary (tract) infections: Secondary | ICD-10-CM | POA: Diagnosis not present

## 2017-09-08 DIAGNOSIS — E785 Hyperlipidemia, unspecified: Secondary | ICD-10-CM | POA: Diagnosis not present

## 2017-09-08 DIAGNOSIS — J4531 Mild persistent asthma with (acute) exacerbation: Secondary | ICD-10-CM | POA: Diagnosis not present

## 2017-09-08 DIAGNOSIS — J449 Chronic obstructive pulmonary disease, unspecified: Secondary | ICD-10-CM | POA: Diagnosis not present

## 2017-09-08 DIAGNOSIS — Z89612 Acquired absence of left leg above knee: Secondary | ICD-10-CM | POA: Diagnosis not present

## 2017-09-08 DIAGNOSIS — G4733 Obstructive sleep apnea (adult) (pediatric): Secondary | ICD-10-CM | POA: Diagnosis not present

## 2017-09-08 DIAGNOSIS — Z993 Dependence on wheelchair: Secondary | ICD-10-CM | POA: Diagnosis not present

## 2017-09-08 DIAGNOSIS — J9611 Chronic respiratory failure with hypoxia: Secondary | ICD-10-CM | POA: Diagnosis not present

## 2017-09-08 DIAGNOSIS — R739 Hyperglycemia, unspecified: Secondary | ICD-10-CM | POA: Diagnosis not present

## 2017-09-08 DIAGNOSIS — Z9181 History of falling: Secondary | ICD-10-CM | POA: Diagnosis not present

## 2017-09-08 DIAGNOSIS — M1991 Primary osteoarthritis, unspecified site: Secondary | ICD-10-CM | POA: Diagnosis not present

## 2017-09-08 DIAGNOSIS — Z9981 Dependence on supplemental oxygen: Secondary | ICD-10-CM | POA: Diagnosis not present

## 2017-09-08 DIAGNOSIS — Z8673 Personal history of transient ischemic attack (TIA), and cerebral infarction without residual deficits: Secondary | ICD-10-CM | POA: Diagnosis not present

## 2017-09-08 DIAGNOSIS — I5032 Chronic diastolic (congestive) heart failure: Secondary | ICD-10-CM | POA: Diagnosis not present

## 2017-09-08 DIAGNOSIS — G4700 Insomnia, unspecified: Secondary | ICD-10-CM | POA: Diagnosis not present

## 2017-09-10 DIAGNOSIS — R069 Unspecified abnormalities of breathing: Secondary | ICD-10-CM | POA: Diagnosis not present

## 2017-09-10 DIAGNOSIS — G8194 Hemiplegia, unspecified affecting left nondominant side: Secondary | ICD-10-CM | POA: Diagnosis not present

## 2017-09-13 DIAGNOSIS — Z89612 Acquired absence of left leg above knee: Secondary | ICD-10-CM | POA: Diagnosis not present

## 2017-09-13 DIAGNOSIS — M25561 Pain in right knee: Secondary | ICD-10-CM | POA: Diagnosis not present

## 2017-09-13 DIAGNOSIS — Z8673 Personal history of transient ischemic attack (TIA), and cerebral infarction without residual deficits: Secondary | ICD-10-CM | POA: Diagnosis not present

## 2017-09-13 DIAGNOSIS — I5032 Chronic diastolic (congestive) heart failure: Secondary | ICD-10-CM | POA: Diagnosis not present

## 2017-09-13 DIAGNOSIS — Z87440 Personal history of urinary (tract) infections: Secondary | ICD-10-CM | POA: Diagnosis not present

## 2017-09-13 DIAGNOSIS — M1991 Primary osteoarthritis, unspecified site: Secondary | ICD-10-CM | POA: Diagnosis not present

## 2017-09-13 DIAGNOSIS — J9611 Chronic respiratory failure with hypoxia: Secondary | ICD-10-CM | POA: Diagnosis not present

## 2017-09-13 DIAGNOSIS — Z993 Dependence on wheelchair: Secondary | ICD-10-CM | POA: Diagnosis not present

## 2017-09-13 DIAGNOSIS — M1711 Unilateral primary osteoarthritis, right knee: Secondary | ICD-10-CM | POA: Diagnosis not present

## 2017-09-13 DIAGNOSIS — J301 Allergic rhinitis due to pollen: Secondary | ICD-10-CM | POA: Diagnosis not present

## 2017-09-13 DIAGNOSIS — Z9181 History of falling: Secondary | ICD-10-CM | POA: Diagnosis not present

## 2017-09-13 DIAGNOSIS — Z9981 Dependence on supplemental oxygen: Secondary | ICD-10-CM | POA: Diagnosis not present

## 2017-09-13 DIAGNOSIS — J4531 Mild persistent asthma with (acute) exacerbation: Secondary | ICD-10-CM | POA: Diagnosis not present

## 2017-09-13 DIAGNOSIS — G4733 Obstructive sleep apnea (adult) (pediatric): Secondary | ICD-10-CM | POA: Diagnosis not present

## 2017-09-13 DIAGNOSIS — I110 Hypertensive heart disease with heart failure: Secondary | ICD-10-CM | POA: Diagnosis not present

## 2017-09-15 DIAGNOSIS — Z8673 Personal history of transient ischemic attack (TIA), and cerebral infarction without residual deficits: Secondary | ICD-10-CM | POA: Diagnosis not present

## 2017-09-15 DIAGNOSIS — I110 Hypertensive heart disease with heart failure: Secondary | ICD-10-CM | POA: Diagnosis not present

## 2017-09-15 DIAGNOSIS — Z87440 Personal history of urinary (tract) infections: Secondary | ICD-10-CM | POA: Diagnosis not present

## 2017-09-15 DIAGNOSIS — I5032 Chronic diastolic (congestive) heart failure: Secondary | ICD-10-CM | POA: Diagnosis not present

## 2017-09-15 DIAGNOSIS — Z993 Dependence on wheelchair: Secondary | ICD-10-CM | POA: Diagnosis not present

## 2017-09-15 DIAGNOSIS — Z89612 Acquired absence of left leg above knee: Secondary | ICD-10-CM | POA: Diagnosis not present

## 2017-09-15 DIAGNOSIS — J301 Allergic rhinitis due to pollen: Secondary | ICD-10-CM | POA: Diagnosis not present

## 2017-09-15 DIAGNOSIS — Z9181 History of falling: Secondary | ICD-10-CM | POA: Diagnosis not present

## 2017-09-15 DIAGNOSIS — Z9981 Dependence on supplemental oxygen: Secondary | ICD-10-CM | POA: Diagnosis not present

## 2017-09-15 DIAGNOSIS — J4531 Mild persistent asthma with (acute) exacerbation: Secondary | ICD-10-CM | POA: Diagnosis not present

## 2017-09-15 DIAGNOSIS — G4733 Obstructive sleep apnea (adult) (pediatric): Secondary | ICD-10-CM | POA: Diagnosis not present

## 2017-09-15 DIAGNOSIS — M1991 Primary osteoarthritis, unspecified site: Secondary | ICD-10-CM | POA: Diagnosis not present

## 2017-09-15 DIAGNOSIS — J9611 Chronic respiratory failure with hypoxia: Secondary | ICD-10-CM | POA: Diagnosis not present

## 2017-09-25 DIAGNOSIS — Z89611 Acquired absence of right leg above knee: Secondary | ICD-10-CM | POA: Diagnosis not present

## 2017-09-30 DIAGNOSIS — J449 Chronic obstructive pulmonary disease, unspecified: Secondary | ICD-10-CM | POA: Diagnosis not present

## 2017-10-03 DIAGNOSIS — J4540 Moderate persistent asthma, uncomplicated: Secondary | ICD-10-CM | POA: Diagnosis not present

## 2017-10-03 DIAGNOSIS — R5383 Other fatigue: Secondary | ICD-10-CM | POA: Diagnosis not present

## 2017-10-03 DIAGNOSIS — J301 Allergic rhinitis due to pollen: Secondary | ICD-10-CM | POA: Diagnosis not present

## 2017-10-03 DIAGNOSIS — G4700 Insomnia, unspecified: Secondary | ICD-10-CM | POA: Diagnosis not present

## 2017-10-11 DIAGNOSIS — G8194 Hemiplegia, unspecified affecting left nondominant side: Secondary | ICD-10-CM | POA: Diagnosis not present

## 2017-10-11 DIAGNOSIS — J069 Acute upper respiratory infection, unspecified: Secondary | ICD-10-CM | POA: Diagnosis not present

## 2017-10-11 DIAGNOSIS — R069 Unspecified abnormalities of breathing: Secondary | ICD-10-CM | POA: Diagnosis not present

## 2017-10-11 DIAGNOSIS — J449 Chronic obstructive pulmonary disease, unspecified: Secondary | ICD-10-CM | POA: Diagnosis not present

## 2017-10-11 DIAGNOSIS — Z79899 Other long term (current) drug therapy: Secondary | ICD-10-CM | POA: Diagnosis not present

## 2017-10-11 DIAGNOSIS — I10 Essential (primary) hypertension: Secondary | ICD-10-CM | POA: Diagnosis not present

## 2017-10-19 DIAGNOSIS — Z9981 Dependence on supplemental oxygen: Secondary | ICD-10-CM | POA: Diagnosis not present

## 2017-10-19 DIAGNOSIS — J301 Allergic rhinitis due to pollen: Secondary | ICD-10-CM | POA: Diagnosis not present

## 2017-10-19 DIAGNOSIS — R296 Repeated falls: Secondary | ICD-10-CM | POA: Diagnosis not present

## 2017-10-19 DIAGNOSIS — Z9181 History of falling: Secondary | ICD-10-CM | POA: Diagnosis not present

## 2017-10-19 DIAGNOSIS — Z89612 Acquired absence of left leg above knee: Secondary | ICD-10-CM | POA: Diagnosis not present

## 2017-10-19 DIAGNOSIS — I5032 Chronic diastolic (congestive) heart failure: Secondary | ICD-10-CM | POA: Diagnosis not present

## 2017-10-19 DIAGNOSIS — J4530 Mild persistent asthma, uncomplicated: Secondary | ICD-10-CM | POA: Diagnosis not present

## 2017-10-19 DIAGNOSIS — J9611 Chronic respiratory failure with hypoxia: Secondary | ICD-10-CM | POA: Diagnosis not present

## 2017-10-19 DIAGNOSIS — G4733 Obstructive sleep apnea (adult) (pediatric): Secondary | ICD-10-CM | POA: Diagnosis not present

## 2017-10-19 DIAGNOSIS — I110 Hypertensive heart disease with heart failure: Secondary | ICD-10-CM | POA: Diagnosis not present

## 2017-10-19 DIAGNOSIS — Z87440 Personal history of urinary (tract) infections: Secondary | ICD-10-CM | POA: Diagnosis not present

## 2017-10-19 DIAGNOSIS — Z993 Dependence on wheelchair: Secondary | ICD-10-CM | POA: Diagnosis not present

## 2017-10-19 DIAGNOSIS — Z8673 Personal history of transient ischemic attack (TIA), and cerebral infarction without residual deficits: Secondary | ICD-10-CM | POA: Diagnosis not present

## 2017-10-19 DIAGNOSIS — M1991 Primary osteoarthritis, unspecified site: Secondary | ICD-10-CM | POA: Diagnosis not present

## 2017-10-20 DIAGNOSIS — R05 Cough: Secondary | ICD-10-CM | POA: Diagnosis not present

## 2017-10-21 DIAGNOSIS — R296 Repeated falls: Secondary | ICD-10-CM | POA: Diagnosis not present

## 2017-10-21 DIAGNOSIS — G4733 Obstructive sleep apnea (adult) (pediatric): Secondary | ICD-10-CM | POA: Diagnosis not present

## 2017-10-21 DIAGNOSIS — J301 Allergic rhinitis due to pollen: Secondary | ICD-10-CM | POA: Diagnosis not present

## 2017-10-21 DIAGNOSIS — I110 Hypertensive heart disease with heart failure: Secondary | ICD-10-CM | POA: Diagnosis not present

## 2017-10-21 DIAGNOSIS — Z9981 Dependence on supplemental oxygen: Secondary | ICD-10-CM | POA: Diagnosis not present

## 2017-10-21 DIAGNOSIS — Z87440 Personal history of urinary (tract) infections: Secondary | ICD-10-CM | POA: Diagnosis not present

## 2017-10-21 DIAGNOSIS — I5032 Chronic diastolic (congestive) heart failure: Secondary | ICD-10-CM | POA: Diagnosis not present

## 2017-10-21 DIAGNOSIS — J4530 Mild persistent asthma, uncomplicated: Secondary | ICD-10-CM | POA: Diagnosis not present

## 2017-10-21 DIAGNOSIS — M1991 Primary osteoarthritis, unspecified site: Secondary | ICD-10-CM | POA: Diagnosis not present

## 2017-10-21 DIAGNOSIS — Z89612 Acquired absence of left leg above knee: Secondary | ICD-10-CM | POA: Diagnosis not present

## 2017-10-21 DIAGNOSIS — Z993 Dependence on wheelchair: Secondary | ICD-10-CM | POA: Diagnosis not present

## 2017-10-21 DIAGNOSIS — Z9181 History of falling: Secondary | ICD-10-CM | POA: Diagnosis not present

## 2017-10-21 DIAGNOSIS — J9611 Chronic respiratory failure with hypoxia: Secondary | ICD-10-CM | POA: Diagnosis not present

## 2017-10-21 DIAGNOSIS — Z8673 Personal history of transient ischemic attack (TIA), and cerebral infarction without residual deficits: Secondary | ICD-10-CM | POA: Diagnosis not present

## 2017-10-24 DIAGNOSIS — Z8673 Personal history of transient ischemic attack (TIA), and cerebral infarction without residual deficits: Secondary | ICD-10-CM | POA: Diagnosis not present

## 2017-10-24 DIAGNOSIS — Z87440 Personal history of urinary (tract) infections: Secondary | ICD-10-CM | POA: Diagnosis not present

## 2017-10-24 DIAGNOSIS — M1991 Primary osteoarthritis, unspecified site: Secondary | ICD-10-CM | POA: Diagnosis not present

## 2017-10-24 DIAGNOSIS — Z9981 Dependence on supplemental oxygen: Secondary | ICD-10-CM | POA: Diagnosis not present

## 2017-10-24 DIAGNOSIS — J301 Allergic rhinitis due to pollen: Secondary | ICD-10-CM | POA: Diagnosis not present

## 2017-10-24 DIAGNOSIS — I5032 Chronic diastolic (congestive) heart failure: Secondary | ICD-10-CM | POA: Diagnosis not present

## 2017-10-24 DIAGNOSIS — J4530 Mild persistent asthma, uncomplicated: Secondary | ICD-10-CM | POA: Diagnosis not present

## 2017-10-24 DIAGNOSIS — Z89612 Acquired absence of left leg above knee: Secondary | ICD-10-CM | POA: Diagnosis not present

## 2017-10-24 DIAGNOSIS — Z993 Dependence on wheelchair: Secondary | ICD-10-CM | POA: Diagnosis not present

## 2017-10-24 DIAGNOSIS — G4733 Obstructive sleep apnea (adult) (pediatric): Secondary | ICD-10-CM | POA: Diagnosis not present

## 2017-10-24 DIAGNOSIS — R296 Repeated falls: Secondary | ICD-10-CM | POA: Diagnosis not present

## 2017-10-24 DIAGNOSIS — I110 Hypertensive heart disease with heart failure: Secondary | ICD-10-CM | POA: Diagnosis not present

## 2017-10-24 DIAGNOSIS — J9611 Chronic respiratory failure with hypoxia: Secondary | ICD-10-CM | POA: Diagnosis not present

## 2017-10-24 DIAGNOSIS — Z9181 History of falling: Secondary | ICD-10-CM | POA: Diagnosis not present

## 2017-10-26 DIAGNOSIS — Z89611 Acquired absence of right leg above knee: Secondary | ICD-10-CM | POA: Diagnosis not present

## 2017-10-27 DIAGNOSIS — J301 Allergic rhinitis due to pollen: Secondary | ICD-10-CM | POA: Diagnosis not present

## 2017-10-27 DIAGNOSIS — I5032 Chronic diastolic (congestive) heart failure: Secondary | ICD-10-CM | POA: Diagnosis not present

## 2017-10-27 DIAGNOSIS — R296 Repeated falls: Secondary | ICD-10-CM | POA: Diagnosis not present

## 2017-10-27 DIAGNOSIS — J4530 Mild persistent asthma, uncomplicated: Secondary | ICD-10-CM | POA: Diagnosis not present

## 2017-10-27 DIAGNOSIS — Z87440 Personal history of urinary (tract) infections: Secondary | ICD-10-CM | POA: Diagnosis not present

## 2017-10-27 DIAGNOSIS — Z993 Dependence on wheelchair: Secondary | ICD-10-CM | POA: Diagnosis not present

## 2017-10-27 DIAGNOSIS — Z8673 Personal history of transient ischemic attack (TIA), and cerebral infarction without residual deficits: Secondary | ICD-10-CM | POA: Diagnosis not present

## 2017-10-27 DIAGNOSIS — Z9981 Dependence on supplemental oxygen: Secondary | ICD-10-CM | POA: Diagnosis not present

## 2017-10-27 DIAGNOSIS — J9611 Chronic respiratory failure with hypoxia: Secondary | ICD-10-CM | POA: Diagnosis not present

## 2017-10-27 DIAGNOSIS — G4733 Obstructive sleep apnea (adult) (pediatric): Secondary | ICD-10-CM | POA: Diagnosis not present

## 2017-10-27 DIAGNOSIS — Z9181 History of falling: Secondary | ICD-10-CM | POA: Diagnosis not present

## 2017-10-27 DIAGNOSIS — M1991 Primary osteoarthritis, unspecified site: Secondary | ICD-10-CM | POA: Diagnosis not present

## 2017-10-27 DIAGNOSIS — I110 Hypertensive heart disease with heart failure: Secondary | ICD-10-CM | POA: Diagnosis not present

## 2017-10-27 DIAGNOSIS — Z89612 Acquired absence of left leg above knee: Secondary | ICD-10-CM | POA: Diagnosis not present

## 2017-10-28 DIAGNOSIS — J449 Chronic obstructive pulmonary disease, unspecified: Secondary | ICD-10-CM | POA: Diagnosis not present

## 2017-11-01 DIAGNOSIS — Z993 Dependence on wheelchair: Secondary | ICD-10-CM | POA: Diagnosis not present

## 2017-11-01 DIAGNOSIS — J189 Pneumonia, unspecified organism: Secondary | ICD-10-CM | POA: Diagnosis not present

## 2017-11-01 DIAGNOSIS — M1991 Primary osteoarthritis, unspecified site: Secondary | ICD-10-CM | POA: Diagnosis not present

## 2017-11-01 DIAGNOSIS — J301 Allergic rhinitis due to pollen: Secondary | ICD-10-CM | POA: Diagnosis not present

## 2017-11-01 DIAGNOSIS — I5032 Chronic diastolic (congestive) heart failure: Secondary | ICD-10-CM | POA: Diagnosis not present

## 2017-11-01 DIAGNOSIS — Z9981 Dependence on supplemental oxygen: Secondary | ICD-10-CM | POA: Diagnosis not present

## 2017-11-01 DIAGNOSIS — I110 Hypertensive heart disease with heart failure: Secondary | ICD-10-CM | POA: Diagnosis not present

## 2017-11-01 DIAGNOSIS — G4733 Obstructive sleep apnea (adult) (pediatric): Secondary | ICD-10-CM | POA: Diagnosis not present

## 2017-11-01 DIAGNOSIS — J9611 Chronic respiratory failure with hypoxia: Secondary | ICD-10-CM | POA: Diagnosis not present

## 2017-11-01 DIAGNOSIS — Z8673 Personal history of transient ischemic attack (TIA), and cerebral infarction without residual deficits: Secondary | ICD-10-CM | POA: Diagnosis not present

## 2017-11-01 DIAGNOSIS — Z89612 Acquired absence of left leg above knee: Secondary | ICD-10-CM | POA: Diagnosis not present

## 2017-11-01 DIAGNOSIS — Z9181 History of falling: Secondary | ICD-10-CM | POA: Diagnosis not present

## 2017-11-01 DIAGNOSIS — R296 Repeated falls: Secondary | ICD-10-CM | POA: Diagnosis not present

## 2017-11-01 DIAGNOSIS — J4530 Mild persistent asthma, uncomplicated: Secondary | ICD-10-CM | POA: Diagnosis not present

## 2017-11-01 DIAGNOSIS — Z87440 Personal history of urinary (tract) infections: Secondary | ICD-10-CM | POA: Diagnosis not present

## 2017-11-01 DIAGNOSIS — J441 Chronic obstructive pulmonary disease with (acute) exacerbation: Secondary | ICD-10-CM | POA: Diagnosis not present

## 2017-11-03 DIAGNOSIS — Z9981 Dependence on supplemental oxygen: Secondary | ICD-10-CM | POA: Diagnosis not present

## 2017-11-03 DIAGNOSIS — M1991 Primary osteoarthritis, unspecified site: Secondary | ICD-10-CM | POA: Diagnosis not present

## 2017-11-03 DIAGNOSIS — Z9181 History of falling: Secondary | ICD-10-CM | POA: Diagnosis not present

## 2017-11-03 DIAGNOSIS — Z993 Dependence on wheelchair: Secondary | ICD-10-CM | POA: Diagnosis not present

## 2017-11-03 DIAGNOSIS — J301 Allergic rhinitis due to pollen: Secondary | ICD-10-CM | POA: Diagnosis not present

## 2017-11-03 DIAGNOSIS — I110 Hypertensive heart disease with heart failure: Secondary | ICD-10-CM | POA: Diagnosis not present

## 2017-11-03 DIAGNOSIS — J4530 Mild persistent asthma, uncomplicated: Secondary | ICD-10-CM | POA: Diagnosis not present

## 2017-11-03 DIAGNOSIS — J441 Chronic obstructive pulmonary disease with (acute) exacerbation: Secondary | ICD-10-CM | POA: Diagnosis not present

## 2017-11-03 DIAGNOSIS — J9611 Chronic respiratory failure with hypoxia: Secondary | ICD-10-CM | POA: Diagnosis not present

## 2017-11-03 DIAGNOSIS — Z87440 Personal history of urinary (tract) infections: Secondary | ICD-10-CM | POA: Diagnosis not present

## 2017-11-03 DIAGNOSIS — Z8673 Personal history of transient ischemic attack (TIA), and cerebral infarction without residual deficits: Secondary | ICD-10-CM | POA: Diagnosis not present

## 2017-11-03 DIAGNOSIS — G4733 Obstructive sleep apnea (adult) (pediatric): Secondary | ICD-10-CM | POA: Diagnosis not present

## 2017-11-03 DIAGNOSIS — I5032 Chronic diastolic (congestive) heart failure: Secondary | ICD-10-CM | POA: Diagnosis not present

## 2017-11-03 DIAGNOSIS — R296 Repeated falls: Secondary | ICD-10-CM | POA: Diagnosis not present

## 2017-11-03 DIAGNOSIS — J189 Pneumonia, unspecified organism: Secondary | ICD-10-CM | POA: Diagnosis not present

## 2017-11-03 DIAGNOSIS — Z89612 Acquired absence of left leg above knee: Secondary | ICD-10-CM | POA: Diagnosis not present

## 2017-11-04 DIAGNOSIS — I110 Hypertensive heart disease with heart failure: Secondary | ICD-10-CM | POA: Diagnosis not present

## 2017-11-04 DIAGNOSIS — Z993 Dependence on wheelchair: Secondary | ICD-10-CM | POA: Diagnosis not present

## 2017-11-04 DIAGNOSIS — J4530 Mild persistent asthma, uncomplicated: Secondary | ICD-10-CM | POA: Diagnosis not present

## 2017-11-04 DIAGNOSIS — M1991 Primary osteoarthritis, unspecified site: Secondary | ICD-10-CM | POA: Diagnosis not present

## 2017-11-04 DIAGNOSIS — Z9181 History of falling: Secondary | ICD-10-CM | POA: Diagnosis not present

## 2017-11-04 DIAGNOSIS — Z9981 Dependence on supplemental oxygen: Secondary | ICD-10-CM | POA: Diagnosis not present

## 2017-11-04 DIAGNOSIS — Z89612 Acquired absence of left leg above knee: Secondary | ICD-10-CM | POA: Diagnosis not present

## 2017-11-04 DIAGNOSIS — J301 Allergic rhinitis due to pollen: Secondary | ICD-10-CM | POA: Diagnosis not present

## 2017-11-04 DIAGNOSIS — G4733 Obstructive sleep apnea (adult) (pediatric): Secondary | ICD-10-CM | POA: Diagnosis not present

## 2017-11-04 DIAGNOSIS — I5032 Chronic diastolic (congestive) heart failure: Secondary | ICD-10-CM | POA: Diagnosis not present

## 2017-11-04 DIAGNOSIS — J441 Chronic obstructive pulmonary disease with (acute) exacerbation: Secondary | ICD-10-CM | POA: Diagnosis not present

## 2017-11-04 DIAGNOSIS — Z87440 Personal history of urinary (tract) infections: Secondary | ICD-10-CM | POA: Diagnosis not present

## 2017-11-04 DIAGNOSIS — R296 Repeated falls: Secondary | ICD-10-CM | POA: Diagnosis not present

## 2017-11-04 DIAGNOSIS — Z8673 Personal history of transient ischemic attack (TIA), and cerebral infarction without residual deficits: Secondary | ICD-10-CM | POA: Diagnosis not present

## 2017-11-04 DIAGNOSIS — J9611 Chronic respiratory failure with hypoxia: Secondary | ICD-10-CM | POA: Diagnosis not present

## 2017-11-04 DIAGNOSIS — J189 Pneumonia, unspecified organism: Secondary | ICD-10-CM | POA: Diagnosis not present

## 2017-11-07 DIAGNOSIS — J301 Allergic rhinitis due to pollen: Secondary | ICD-10-CM | POA: Diagnosis not present

## 2017-11-07 DIAGNOSIS — G4700 Insomnia, unspecified: Secondary | ICD-10-CM | POA: Diagnosis not present

## 2017-11-07 DIAGNOSIS — J4540 Moderate persistent asthma, uncomplicated: Secondary | ICD-10-CM | POA: Diagnosis not present

## 2017-11-07 DIAGNOSIS — R5383 Other fatigue: Secondary | ICD-10-CM | POA: Diagnosis not present

## 2017-11-08 DIAGNOSIS — Z993 Dependence on wheelchair: Secondary | ICD-10-CM | POA: Diagnosis not present

## 2017-11-08 DIAGNOSIS — Z89612 Acquired absence of left leg above knee: Secondary | ICD-10-CM | POA: Diagnosis not present

## 2017-11-08 DIAGNOSIS — Z87440 Personal history of urinary (tract) infections: Secondary | ICD-10-CM | POA: Diagnosis not present

## 2017-11-08 DIAGNOSIS — Z9981 Dependence on supplemental oxygen: Secondary | ICD-10-CM | POA: Diagnosis not present

## 2017-11-08 DIAGNOSIS — G8194 Hemiplegia, unspecified affecting left nondominant side: Secondary | ICD-10-CM | POA: Diagnosis not present

## 2017-11-08 DIAGNOSIS — J301 Allergic rhinitis due to pollen: Secondary | ICD-10-CM | POA: Diagnosis not present

## 2017-11-08 DIAGNOSIS — J9611 Chronic respiratory failure with hypoxia: Secondary | ICD-10-CM | POA: Diagnosis not present

## 2017-11-08 DIAGNOSIS — J189 Pneumonia, unspecified organism: Secondary | ICD-10-CM | POA: Diagnosis not present

## 2017-11-08 DIAGNOSIS — J4530 Mild persistent asthma, uncomplicated: Secondary | ICD-10-CM | POA: Diagnosis not present

## 2017-11-08 DIAGNOSIS — M1991 Primary osteoarthritis, unspecified site: Secondary | ICD-10-CM | POA: Diagnosis not present

## 2017-11-08 DIAGNOSIS — Z8673 Personal history of transient ischemic attack (TIA), and cerebral infarction without residual deficits: Secondary | ICD-10-CM | POA: Diagnosis not present

## 2017-11-08 DIAGNOSIS — R069 Unspecified abnormalities of breathing: Secondary | ICD-10-CM | POA: Diagnosis not present

## 2017-11-08 DIAGNOSIS — J441 Chronic obstructive pulmonary disease with (acute) exacerbation: Secondary | ICD-10-CM | POA: Diagnosis not present

## 2017-11-08 DIAGNOSIS — Z9181 History of falling: Secondary | ICD-10-CM | POA: Diagnosis not present

## 2017-11-08 DIAGNOSIS — R296 Repeated falls: Secondary | ICD-10-CM | POA: Diagnosis not present

## 2017-11-08 DIAGNOSIS — I110 Hypertensive heart disease with heart failure: Secondary | ICD-10-CM | POA: Diagnosis not present

## 2017-11-08 DIAGNOSIS — G4733 Obstructive sleep apnea (adult) (pediatric): Secondary | ICD-10-CM | POA: Diagnosis not present

## 2017-11-08 DIAGNOSIS — I5032 Chronic diastolic (congestive) heart failure: Secondary | ICD-10-CM | POA: Diagnosis not present

## 2017-11-10 DIAGNOSIS — J449 Chronic obstructive pulmonary disease, unspecified: Secondary | ICD-10-CM | POA: Diagnosis not present

## 2017-11-15 DIAGNOSIS — Z993 Dependence on wheelchair: Secondary | ICD-10-CM | POA: Diagnosis not present

## 2017-11-15 DIAGNOSIS — Z89612 Acquired absence of left leg above knee: Secondary | ICD-10-CM | POA: Diagnosis not present

## 2017-11-15 DIAGNOSIS — J4530 Mild persistent asthma, uncomplicated: Secondary | ICD-10-CM | POA: Diagnosis not present

## 2017-11-15 DIAGNOSIS — R296 Repeated falls: Secondary | ICD-10-CM | POA: Diagnosis not present

## 2017-11-15 DIAGNOSIS — Z87440 Personal history of urinary (tract) infections: Secondary | ICD-10-CM | POA: Diagnosis not present

## 2017-11-15 DIAGNOSIS — Z9181 History of falling: Secondary | ICD-10-CM | POA: Diagnosis not present

## 2017-11-15 DIAGNOSIS — E041 Nontoxic single thyroid nodule: Secondary | ICD-10-CM | POA: Diagnosis not present

## 2017-11-15 DIAGNOSIS — M1991 Primary osteoarthritis, unspecified site: Secondary | ICD-10-CM | POA: Diagnosis not present

## 2017-11-15 DIAGNOSIS — Z9981 Dependence on supplemental oxygen: Secondary | ICD-10-CM | POA: Diagnosis not present

## 2017-11-15 DIAGNOSIS — J9611 Chronic respiratory failure with hypoxia: Secondary | ICD-10-CM | POA: Diagnosis not present

## 2017-11-15 DIAGNOSIS — I10 Essential (primary) hypertension: Secondary | ICD-10-CM | POA: Diagnosis not present

## 2017-11-15 DIAGNOSIS — J189 Pneumonia, unspecified organism: Secondary | ICD-10-CM | POA: Diagnosis not present

## 2017-11-15 DIAGNOSIS — I110 Hypertensive heart disease with heart failure: Secondary | ICD-10-CM | POA: Diagnosis not present

## 2017-11-15 DIAGNOSIS — E785 Hyperlipidemia, unspecified: Secondary | ICD-10-CM | POA: Diagnosis not present

## 2017-11-15 DIAGNOSIS — J441 Chronic obstructive pulmonary disease with (acute) exacerbation: Secondary | ICD-10-CM | POA: Diagnosis not present

## 2017-11-15 DIAGNOSIS — R300 Dysuria: Secondary | ICD-10-CM | POA: Diagnosis not present

## 2017-11-15 DIAGNOSIS — G4733 Obstructive sleep apnea (adult) (pediatric): Secondary | ICD-10-CM | POA: Diagnosis not present

## 2017-11-15 DIAGNOSIS — R739 Hyperglycemia, unspecified: Secondary | ICD-10-CM | POA: Diagnosis not present

## 2017-11-15 DIAGNOSIS — I5032 Chronic diastolic (congestive) heart failure: Secondary | ICD-10-CM | POA: Diagnosis not present

## 2017-11-15 DIAGNOSIS — Z79899 Other long term (current) drug therapy: Secondary | ICD-10-CM | POA: Diagnosis not present

## 2017-11-15 DIAGNOSIS — Z8673 Personal history of transient ischemic attack (TIA), and cerebral infarction without residual deficits: Secondary | ICD-10-CM | POA: Diagnosis not present

## 2017-11-15 DIAGNOSIS — J301 Allergic rhinitis due to pollen: Secondary | ICD-10-CM | POA: Diagnosis not present

## 2017-11-18 DIAGNOSIS — J4530 Mild persistent asthma, uncomplicated: Secondary | ICD-10-CM | POA: Diagnosis not present

## 2017-11-18 DIAGNOSIS — G4733 Obstructive sleep apnea (adult) (pediatric): Secondary | ICD-10-CM | POA: Diagnosis not present

## 2017-11-18 DIAGNOSIS — Z9981 Dependence on supplemental oxygen: Secondary | ICD-10-CM | POA: Diagnosis not present

## 2017-11-18 DIAGNOSIS — J301 Allergic rhinitis due to pollen: Secondary | ICD-10-CM | POA: Diagnosis not present

## 2017-11-18 DIAGNOSIS — Z993 Dependence on wheelchair: Secondary | ICD-10-CM | POA: Diagnosis not present

## 2017-11-18 DIAGNOSIS — Z8673 Personal history of transient ischemic attack (TIA), and cerebral infarction without residual deficits: Secondary | ICD-10-CM | POA: Diagnosis not present

## 2017-11-18 DIAGNOSIS — J189 Pneumonia, unspecified organism: Secondary | ICD-10-CM | POA: Diagnosis not present

## 2017-11-18 DIAGNOSIS — I110 Hypertensive heart disease with heart failure: Secondary | ICD-10-CM | POA: Diagnosis not present

## 2017-11-18 DIAGNOSIS — J9611 Chronic respiratory failure with hypoxia: Secondary | ICD-10-CM | POA: Diagnosis not present

## 2017-11-18 DIAGNOSIS — I5032 Chronic diastolic (congestive) heart failure: Secondary | ICD-10-CM | POA: Diagnosis not present

## 2017-11-18 DIAGNOSIS — Z89612 Acquired absence of left leg above knee: Secondary | ICD-10-CM | POA: Diagnosis not present

## 2017-11-18 DIAGNOSIS — Z9181 History of falling: Secondary | ICD-10-CM | POA: Diagnosis not present

## 2017-11-18 DIAGNOSIS — Z87440 Personal history of urinary (tract) infections: Secondary | ICD-10-CM | POA: Diagnosis not present

## 2017-11-18 DIAGNOSIS — R296 Repeated falls: Secondary | ICD-10-CM | POA: Diagnosis not present

## 2017-11-18 DIAGNOSIS — J441 Chronic obstructive pulmonary disease with (acute) exacerbation: Secondary | ICD-10-CM | POA: Diagnosis not present

## 2017-11-18 DIAGNOSIS — M1991 Primary osteoarthritis, unspecified site: Secondary | ICD-10-CM | POA: Diagnosis not present

## 2017-11-21 DIAGNOSIS — Z89612 Acquired absence of left leg above knee: Secondary | ICD-10-CM | POA: Diagnosis not present

## 2017-11-21 DIAGNOSIS — R072 Precordial pain: Secondary | ICD-10-CM | POA: Diagnosis not present

## 2017-11-21 DIAGNOSIS — R590 Localized enlarged lymph nodes: Secondary | ICD-10-CM | POA: Diagnosis not present

## 2017-11-21 DIAGNOSIS — J9611 Chronic respiratory failure with hypoxia: Secondary | ICD-10-CM | POA: Diagnosis not present

## 2017-11-21 DIAGNOSIS — Z888 Allergy status to other drugs, medicaments and biological substances status: Secondary | ICD-10-CM | POA: Diagnosis not present

## 2017-11-21 DIAGNOSIS — M1991 Primary osteoarthritis, unspecified site: Secondary | ICD-10-CM | POA: Diagnosis not present

## 2017-11-21 DIAGNOSIS — J4530 Mild persistent asthma, uncomplicated: Secondary | ICD-10-CM | POA: Diagnosis not present

## 2017-11-21 DIAGNOSIS — J440 Chronic obstructive pulmonary disease with acute lower respiratory infection: Secondary | ICD-10-CM | POA: Diagnosis not present

## 2017-11-21 DIAGNOSIS — Z882 Allergy status to sulfonamides status: Secondary | ICD-10-CM | POA: Diagnosis not present

## 2017-11-21 DIAGNOSIS — Z87440 Personal history of urinary (tract) infections: Secondary | ICD-10-CM | POA: Diagnosis not present

## 2017-11-21 DIAGNOSIS — K219 Gastro-esophageal reflux disease without esophagitis: Secondary | ICD-10-CM | POA: Diagnosis not present

## 2017-11-21 DIAGNOSIS — I5032 Chronic diastolic (congestive) heart failure: Secondary | ICD-10-CM | POA: Diagnosis not present

## 2017-11-21 DIAGNOSIS — Z79899 Other long term (current) drug therapy: Secondary | ICD-10-CM | POA: Diagnosis not present

## 2017-11-21 DIAGNOSIS — E873 Alkalosis: Secondary | ICD-10-CM | POA: Diagnosis not present

## 2017-11-21 DIAGNOSIS — R51 Headache: Secondary | ICD-10-CM | POA: Diagnosis not present

## 2017-11-21 DIAGNOSIS — J189 Pneumonia, unspecified organism: Secondary | ICD-10-CM | POA: Diagnosis not present

## 2017-11-21 DIAGNOSIS — J9601 Acute respiratory failure with hypoxia: Secondary | ICD-10-CM | POA: Diagnosis not present

## 2017-11-21 DIAGNOSIS — R0602 Shortness of breath: Secondary | ICD-10-CM | POA: Diagnosis not present

## 2017-11-21 DIAGNOSIS — I110 Hypertensive heart disease with heart failure: Secondary | ICD-10-CM | POA: Diagnosis not present

## 2017-11-21 DIAGNOSIS — E041 Nontoxic single thyroid nodule: Secondary | ICD-10-CM | POA: Diagnosis not present

## 2017-11-21 DIAGNOSIS — E876 Hypokalemia: Secondary | ICD-10-CM | POA: Diagnosis not present

## 2017-11-21 DIAGNOSIS — Z8673 Personal history of transient ischemic attack (TIA), and cerebral infarction without residual deficits: Secondary | ICD-10-CM | POA: Diagnosis not present

## 2017-11-21 DIAGNOSIS — I69934 Monoplegia of upper limb following unspecified cerebrovascular disease affecting left non-dominant side: Secondary | ICD-10-CM | POA: Diagnosis not present

## 2017-11-21 DIAGNOSIS — G4733 Obstructive sleep apnea (adult) (pediatric): Secondary | ICD-10-CM | POA: Diagnosis not present

## 2017-11-21 DIAGNOSIS — Z9181 History of falling: Secondary | ICD-10-CM | POA: Diagnosis not present

## 2017-11-21 DIAGNOSIS — Z993 Dependence on wheelchair: Secondary | ICD-10-CM | POA: Diagnosis not present

## 2017-11-21 DIAGNOSIS — G92 Toxic encephalopathy: Secondary | ICD-10-CM | POA: Diagnosis not present

## 2017-11-21 DIAGNOSIS — Z9981 Dependence on supplemental oxygen: Secondary | ICD-10-CM | POA: Diagnosis not present

## 2017-11-21 DIAGNOSIS — M1990 Unspecified osteoarthritis, unspecified site: Secondary | ICD-10-CM | POA: Diagnosis not present

## 2017-11-21 DIAGNOSIS — R296 Repeated falls: Secondary | ICD-10-CM | POA: Diagnosis not present

## 2017-11-21 DIAGNOSIS — J301 Allergic rhinitis due to pollen: Secondary | ICD-10-CM | POA: Diagnosis not present

## 2017-11-21 DIAGNOSIS — Z885 Allergy status to narcotic agent status: Secondary | ICD-10-CM | POA: Diagnosis not present

## 2017-11-21 DIAGNOSIS — E7800 Pure hypercholesterolemia, unspecified: Secondary | ICD-10-CM | POA: Diagnosis not present

## 2017-11-21 DIAGNOSIS — J441 Chronic obstructive pulmonary disease with (acute) exacerbation: Secondary | ICD-10-CM | POA: Diagnosis not present

## 2017-11-21 DIAGNOSIS — R071 Chest pain on breathing: Secondary | ICD-10-CM | POA: Diagnosis not present

## 2017-11-21 DIAGNOSIS — A419 Sepsis, unspecified organism: Secondary | ICD-10-CM | POA: Diagnosis not present

## 2017-11-22 DIAGNOSIS — E7800 Pure hypercholesterolemia, unspecified: Secondary | ICD-10-CM | POA: Diagnosis not present

## 2017-11-22 DIAGNOSIS — E876 Hypokalemia: Secondary | ICD-10-CM | POA: Diagnosis not present

## 2017-11-22 DIAGNOSIS — R0602 Shortness of breath: Secondary | ICD-10-CM | POA: Diagnosis not present

## 2017-11-22 DIAGNOSIS — K219 Gastro-esophageal reflux disease without esophagitis: Secondary | ICD-10-CM | POA: Diagnosis not present

## 2017-11-22 DIAGNOSIS — J189 Pneumonia, unspecified organism: Secondary | ICD-10-CM | POA: Diagnosis not present

## 2017-11-22 DIAGNOSIS — R51 Headache: Secondary | ICD-10-CM | POA: Diagnosis not present

## 2017-11-22 DIAGNOSIS — R590 Localized enlarged lymph nodes: Secondary | ICD-10-CM | POA: Diagnosis not present

## 2017-11-22 DIAGNOSIS — E042 Nontoxic multinodular goiter: Secondary | ICD-10-CM | POA: Diagnosis not present

## 2017-11-22 DIAGNOSIS — J441 Chronic obstructive pulmonary disease with (acute) exacerbation: Secondary | ICD-10-CM | POA: Diagnosis not present

## 2017-11-22 DIAGNOSIS — Z885 Allergy status to narcotic agent status: Secondary | ICD-10-CM | POA: Diagnosis not present

## 2017-11-22 DIAGNOSIS — M1990 Unspecified osteoarthritis, unspecified site: Secondary | ICD-10-CM | POA: Diagnosis not present

## 2017-11-22 DIAGNOSIS — E873 Alkalosis: Secondary | ICD-10-CM | POA: Diagnosis not present

## 2017-11-22 DIAGNOSIS — Z89611 Acquired absence of right leg above knee: Secondary | ICD-10-CM | POA: Diagnosis not present

## 2017-11-22 DIAGNOSIS — I69934 Monoplegia of upper limb following unspecified cerebrovascular disease affecting left non-dominant side: Secondary | ICD-10-CM | POA: Diagnosis not present

## 2017-11-22 DIAGNOSIS — Z9981 Dependence on supplemental oxygen: Secondary | ICD-10-CM | POA: Diagnosis not present

## 2017-11-22 DIAGNOSIS — Z79899 Other long term (current) drug therapy: Secondary | ICD-10-CM | POA: Diagnosis not present

## 2017-11-22 DIAGNOSIS — Z882 Allergy status to sulfonamides status: Secondary | ICD-10-CM | POA: Diagnosis not present

## 2017-11-22 DIAGNOSIS — E041 Nontoxic single thyroid nodule: Secondary | ICD-10-CM | POA: Diagnosis not present

## 2017-11-22 DIAGNOSIS — Z888 Allergy status to other drugs, medicaments and biological substances status: Secondary | ICD-10-CM | POA: Diagnosis not present

## 2017-11-22 DIAGNOSIS — J9601 Acute respiratory failure with hypoxia: Secondary | ICD-10-CM | POA: Diagnosis not present

## 2017-11-22 DIAGNOSIS — R45851 Suicidal ideations: Secondary | ICD-10-CM

## 2017-11-22 DIAGNOSIS — Z89612 Acquired absence of left leg above knee: Secondary | ICD-10-CM | POA: Diagnosis not present

## 2017-11-22 DIAGNOSIS — J440 Chronic obstructive pulmonary disease with acute lower respiratory infection: Secondary | ICD-10-CM | POA: Diagnosis not present

## 2017-11-23 DIAGNOSIS — E873 Alkalosis: Secondary | ICD-10-CM | POA: Diagnosis not present

## 2017-11-23 DIAGNOSIS — R45851 Suicidal ideations: Secondary | ICD-10-CM | POA: Diagnosis not present

## 2017-11-23 DIAGNOSIS — Z79899 Other long term (current) drug therapy: Secondary | ICD-10-CM | POA: Diagnosis not present

## 2017-11-23 DIAGNOSIS — E876 Hypokalemia: Secondary | ICD-10-CM | POA: Diagnosis not present

## 2017-11-23 DIAGNOSIS — E7800 Pure hypercholesterolemia, unspecified: Secondary | ICD-10-CM | POA: Diagnosis not present

## 2017-11-23 DIAGNOSIS — J9601 Acute respiratory failure with hypoxia: Secondary | ICD-10-CM | POA: Diagnosis not present

## 2017-11-23 DIAGNOSIS — Z888 Allergy status to other drugs, medicaments and biological substances status: Secondary | ICD-10-CM | POA: Diagnosis not present

## 2017-11-23 DIAGNOSIS — K219 Gastro-esophageal reflux disease without esophagitis: Secondary | ICD-10-CM | POA: Diagnosis not present

## 2017-11-23 DIAGNOSIS — Z885 Allergy status to narcotic agent status: Secondary | ICD-10-CM | POA: Diagnosis not present

## 2017-11-23 DIAGNOSIS — J441 Chronic obstructive pulmonary disease with (acute) exacerbation: Secondary | ICD-10-CM | POA: Diagnosis not present

## 2017-11-23 DIAGNOSIS — Z9981 Dependence on supplemental oxygen: Secondary | ICD-10-CM | POA: Diagnosis not present

## 2017-11-23 DIAGNOSIS — R51 Headache: Secondary | ICD-10-CM | POA: Diagnosis not present

## 2017-11-23 DIAGNOSIS — M1990 Unspecified osteoarthritis, unspecified site: Secondary | ICD-10-CM | POA: Diagnosis not present

## 2017-11-23 DIAGNOSIS — E041 Nontoxic single thyroid nodule: Secondary | ICD-10-CM | POA: Diagnosis not present

## 2017-11-23 DIAGNOSIS — R0602 Shortness of breath: Secondary | ICD-10-CM | POA: Diagnosis not present

## 2017-11-23 DIAGNOSIS — Z89612 Acquired absence of left leg above knee: Secondary | ICD-10-CM | POA: Diagnosis not present

## 2017-11-23 DIAGNOSIS — J440 Chronic obstructive pulmonary disease with acute lower respiratory infection: Secondary | ICD-10-CM | POA: Diagnosis not present

## 2017-11-23 DIAGNOSIS — Z882 Allergy status to sulfonamides status: Secondary | ICD-10-CM | POA: Diagnosis not present

## 2017-11-23 DIAGNOSIS — R590 Localized enlarged lymph nodes: Secondary | ICD-10-CM | POA: Diagnosis not present

## 2017-11-23 DIAGNOSIS — I69934 Monoplegia of upper limb following unspecified cerebrovascular disease affecting left non-dominant side: Secondary | ICD-10-CM | POA: Diagnosis not present

## 2017-11-23 DIAGNOSIS — J189 Pneumonia, unspecified organism: Secondary | ICD-10-CM | POA: Diagnosis not present

## 2017-11-24 DIAGNOSIS — E7800 Pure hypercholesterolemia, unspecified: Secondary | ICD-10-CM | POA: Diagnosis not present

## 2017-11-24 DIAGNOSIS — E876 Hypokalemia: Secondary | ICD-10-CM | POA: Diagnosis not present

## 2017-11-24 DIAGNOSIS — Z888 Allergy status to other drugs, medicaments and biological substances status: Secondary | ICD-10-CM | POA: Diagnosis not present

## 2017-11-24 DIAGNOSIS — I69934 Monoplegia of upper limb following unspecified cerebrovascular disease affecting left non-dominant side: Secondary | ICD-10-CM | POA: Diagnosis not present

## 2017-11-24 DIAGNOSIS — J441 Chronic obstructive pulmonary disease with (acute) exacerbation: Secondary | ICD-10-CM | POA: Diagnosis not present

## 2017-11-24 DIAGNOSIS — J440 Chronic obstructive pulmonary disease with acute lower respiratory infection: Secondary | ICD-10-CM | POA: Diagnosis not present

## 2017-11-24 DIAGNOSIS — E041 Nontoxic single thyroid nodule: Secondary | ICD-10-CM | POA: Diagnosis not present

## 2017-11-24 DIAGNOSIS — Z79899 Other long term (current) drug therapy: Secondary | ICD-10-CM | POA: Diagnosis not present

## 2017-11-24 DIAGNOSIS — R590 Localized enlarged lymph nodes: Secondary | ICD-10-CM | POA: Diagnosis not present

## 2017-11-24 DIAGNOSIS — Z89612 Acquired absence of left leg above knee: Secondary | ICD-10-CM | POA: Diagnosis not present

## 2017-11-24 DIAGNOSIS — Z9981 Dependence on supplemental oxygen: Secondary | ICD-10-CM | POA: Diagnosis not present

## 2017-11-24 DIAGNOSIS — Z885 Allergy status to narcotic agent status: Secondary | ICD-10-CM | POA: Diagnosis not present

## 2017-11-24 DIAGNOSIS — J189 Pneumonia, unspecified organism: Secondary | ICD-10-CM | POA: Diagnosis not present

## 2017-11-24 DIAGNOSIS — Z882 Allergy status to sulfonamides status: Secondary | ICD-10-CM | POA: Diagnosis not present

## 2017-11-24 DIAGNOSIS — R51 Headache: Secondary | ICD-10-CM | POA: Diagnosis not present

## 2017-11-24 DIAGNOSIS — M1990 Unspecified osteoarthritis, unspecified site: Secondary | ICD-10-CM | POA: Diagnosis not present

## 2017-11-24 DIAGNOSIS — E873 Alkalosis: Secondary | ICD-10-CM | POA: Diagnosis not present

## 2017-11-24 DIAGNOSIS — K219 Gastro-esophageal reflux disease without esophagitis: Secondary | ICD-10-CM | POA: Diagnosis not present

## 2017-11-24 DIAGNOSIS — J9601 Acute respiratory failure with hypoxia: Secondary | ICD-10-CM | POA: Diagnosis not present

## 2017-11-25 DIAGNOSIS — J9601 Acute respiratory failure with hypoxia: Secondary | ICD-10-CM | POA: Diagnosis not present

## 2017-11-25 DIAGNOSIS — Z888 Allergy status to other drugs, medicaments and biological substances status: Secondary | ICD-10-CM | POA: Diagnosis not present

## 2017-11-25 DIAGNOSIS — Z885 Allergy status to narcotic agent status: Secondary | ICD-10-CM | POA: Diagnosis not present

## 2017-11-25 DIAGNOSIS — E873 Alkalosis: Secondary | ICD-10-CM | POA: Diagnosis not present

## 2017-11-25 DIAGNOSIS — M1990 Unspecified osteoarthritis, unspecified site: Secondary | ICD-10-CM | POA: Diagnosis not present

## 2017-11-25 DIAGNOSIS — Z79899 Other long term (current) drug therapy: Secondary | ICD-10-CM | POA: Diagnosis not present

## 2017-11-25 DIAGNOSIS — Z882 Allergy status to sulfonamides status: Secondary | ICD-10-CM | POA: Diagnosis not present

## 2017-11-25 DIAGNOSIS — J441 Chronic obstructive pulmonary disease with (acute) exacerbation: Secondary | ICD-10-CM | POA: Diagnosis not present

## 2017-11-25 DIAGNOSIS — R590 Localized enlarged lymph nodes: Secondary | ICD-10-CM | POA: Diagnosis not present

## 2017-11-25 DIAGNOSIS — E7800 Pure hypercholesterolemia, unspecified: Secondary | ICD-10-CM | POA: Diagnosis not present

## 2017-11-25 DIAGNOSIS — J189 Pneumonia, unspecified organism: Secondary | ICD-10-CM | POA: Diagnosis not present

## 2017-11-25 DIAGNOSIS — Z89612 Acquired absence of left leg above knee: Secondary | ICD-10-CM | POA: Diagnosis not present

## 2017-11-25 DIAGNOSIS — R51 Headache: Secondary | ICD-10-CM | POA: Diagnosis not present

## 2017-11-25 DIAGNOSIS — E041 Nontoxic single thyroid nodule: Secondary | ICD-10-CM | POA: Diagnosis not present

## 2017-11-25 DIAGNOSIS — E876 Hypokalemia: Secondary | ICD-10-CM | POA: Diagnosis not present

## 2017-11-25 DIAGNOSIS — Z9981 Dependence on supplemental oxygen: Secondary | ICD-10-CM | POA: Diagnosis not present

## 2017-11-25 DIAGNOSIS — K219 Gastro-esophageal reflux disease without esophagitis: Secondary | ICD-10-CM | POA: Diagnosis not present

## 2017-11-25 DIAGNOSIS — J440 Chronic obstructive pulmonary disease with acute lower respiratory infection: Secondary | ICD-10-CM | POA: Diagnosis not present

## 2017-11-25 DIAGNOSIS — I69934 Monoplegia of upper limb following unspecified cerebrovascular disease affecting left non-dominant side: Secondary | ICD-10-CM | POA: Diagnosis not present

## 2017-11-26 DIAGNOSIS — Z89612 Acquired absence of left leg above knee: Secondary | ICD-10-CM | POA: Diagnosis not present

## 2017-11-26 DIAGNOSIS — J441 Chronic obstructive pulmonary disease with (acute) exacerbation: Secondary | ICD-10-CM | POA: Diagnosis not present

## 2017-11-26 DIAGNOSIS — I5032 Chronic diastolic (congestive) heart failure: Secondary | ICD-10-CM | POA: Diagnosis not present

## 2017-11-26 DIAGNOSIS — Z9981 Dependence on supplemental oxygen: Secondary | ICD-10-CM | POA: Diagnosis not present

## 2017-11-26 DIAGNOSIS — I110 Hypertensive heart disease with heart failure: Secondary | ICD-10-CM | POA: Diagnosis not present

## 2017-11-26 DIAGNOSIS — J301 Allergic rhinitis due to pollen: Secondary | ICD-10-CM | POA: Diagnosis not present

## 2017-11-26 DIAGNOSIS — G4733 Obstructive sleep apnea (adult) (pediatric): Secondary | ICD-10-CM | POA: Diagnosis not present

## 2017-11-26 DIAGNOSIS — M1991 Primary osteoarthritis, unspecified site: Secondary | ICD-10-CM | POA: Diagnosis not present

## 2017-11-26 DIAGNOSIS — J9611 Chronic respiratory failure with hypoxia: Secondary | ICD-10-CM | POA: Diagnosis not present

## 2017-11-26 DIAGNOSIS — Z9181 History of falling: Secondary | ICD-10-CM | POA: Diagnosis not present

## 2017-11-26 DIAGNOSIS — Z87440 Personal history of urinary (tract) infections: Secondary | ICD-10-CM | POA: Diagnosis not present

## 2017-11-26 DIAGNOSIS — J4530 Mild persistent asthma, uncomplicated: Secondary | ICD-10-CM | POA: Diagnosis not present

## 2017-11-26 DIAGNOSIS — Z993 Dependence on wheelchair: Secondary | ICD-10-CM | POA: Diagnosis not present

## 2017-11-26 DIAGNOSIS — J189 Pneumonia, unspecified organism: Secondary | ICD-10-CM | POA: Diagnosis not present

## 2017-11-26 DIAGNOSIS — Z8673 Personal history of transient ischemic attack (TIA), and cerebral infarction without residual deficits: Secondary | ICD-10-CM | POA: Diagnosis not present

## 2017-11-26 DIAGNOSIS — R296 Repeated falls: Secondary | ICD-10-CM | POA: Diagnosis not present

## 2017-11-28 DIAGNOSIS — J301 Allergic rhinitis due to pollen: Secondary | ICD-10-CM | POA: Diagnosis not present

## 2017-11-28 DIAGNOSIS — G4733 Obstructive sleep apnea (adult) (pediatric): Secondary | ICD-10-CM | POA: Diagnosis not present

## 2017-11-28 DIAGNOSIS — J449 Chronic obstructive pulmonary disease, unspecified: Secondary | ICD-10-CM | POA: Diagnosis not present

## 2017-11-28 DIAGNOSIS — Z9981 Dependence on supplemental oxygen: Secondary | ICD-10-CM | POA: Diagnosis not present

## 2017-11-28 DIAGNOSIS — I110 Hypertensive heart disease with heart failure: Secondary | ICD-10-CM | POA: Diagnosis not present

## 2017-11-28 DIAGNOSIS — J9611 Chronic respiratory failure with hypoxia: Secondary | ICD-10-CM | POA: Diagnosis not present

## 2017-11-28 DIAGNOSIS — M1991 Primary osteoarthritis, unspecified site: Secondary | ICD-10-CM | POA: Diagnosis not present

## 2017-11-28 DIAGNOSIS — Z89612 Acquired absence of left leg above knee: Secondary | ICD-10-CM | POA: Diagnosis not present

## 2017-11-28 DIAGNOSIS — Z993 Dependence on wheelchair: Secondary | ICD-10-CM | POA: Diagnosis not present

## 2017-11-28 DIAGNOSIS — R296 Repeated falls: Secondary | ICD-10-CM | POA: Diagnosis not present

## 2017-11-28 DIAGNOSIS — J441 Chronic obstructive pulmonary disease with (acute) exacerbation: Secondary | ICD-10-CM | POA: Diagnosis not present

## 2017-11-28 DIAGNOSIS — J189 Pneumonia, unspecified organism: Secondary | ICD-10-CM | POA: Diagnosis not present

## 2017-11-28 DIAGNOSIS — I5032 Chronic diastolic (congestive) heart failure: Secondary | ICD-10-CM | POA: Diagnosis not present

## 2017-11-28 DIAGNOSIS — Z9181 History of falling: Secondary | ICD-10-CM | POA: Diagnosis not present

## 2017-11-28 DIAGNOSIS — Z8673 Personal history of transient ischemic attack (TIA), and cerebral infarction without residual deficits: Secondary | ICD-10-CM | POA: Diagnosis not present

## 2017-11-28 DIAGNOSIS — J4530 Mild persistent asthma, uncomplicated: Secondary | ICD-10-CM | POA: Diagnosis not present

## 2017-11-28 DIAGNOSIS — Z87440 Personal history of urinary (tract) infections: Secondary | ICD-10-CM | POA: Diagnosis not present

## 2017-11-29 DIAGNOSIS — E041 Nontoxic single thyroid nodule: Secondary | ICD-10-CM | POA: Diagnosis not present

## 2017-11-29 DIAGNOSIS — J189 Pneumonia, unspecified organism: Secondary | ICD-10-CM | POA: Diagnosis not present

## 2017-11-29 DIAGNOSIS — J449 Chronic obstructive pulmonary disease, unspecified: Secondary | ICD-10-CM | POA: Diagnosis not present

## 2017-11-29 DIAGNOSIS — Z79899 Other long term (current) drug therapy: Secondary | ICD-10-CM | POA: Diagnosis not present

## 2017-11-30 DIAGNOSIS — J301 Allergic rhinitis due to pollen: Secondary | ICD-10-CM | POA: Diagnosis not present

## 2017-11-30 DIAGNOSIS — Z9981 Dependence on supplemental oxygen: Secondary | ICD-10-CM | POA: Diagnosis not present

## 2017-11-30 DIAGNOSIS — Z8673 Personal history of transient ischemic attack (TIA), and cerebral infarction without residual deficits: Secondary | ICD-10-CM | POA: Diagnosis not present

## 2017-11-30 DIAGNOSIS — M1991 Primary osteoarthritis, unspecified site: Secondary | ICD-10-CM | POA: Diagnosis not present

## 2017-11-30 DIAGNOSIS — G4733 Obstructive sleep apnea (adult) (pediatric): Secondary | ICD-10-CM | POA: Diagnosis not present

## 2017-11-30 DIAGNOSIS — R296 Repeated falls: Secondary | ICD-10-CM | POA: Diagnosis not present

## 2017-11-30 DIAGNOSIS — J189 Pneumonia, unspecified organism: Secondary | ICD-10-CM | POA: Diagnosis not present

## 2017-11-30 DIAGNOSIS — I5032 Chronic diastolic (congestive) heart failure: Secondary | ICD-10-CM | POA: Diagnosis not present

## 2017-11-30 DIAGNOSIS — J4530 Mild persistent asthma, uncomplicated: Secondary | ICD-10-CM | POA: Diagnosis not present

## 2017-11-30 DIAGNOSIS — Z89612 Acquired absence of left leg above knee: Secondary | ICD-10-CM | POA: Diagnosis not present

## 2017-11-30 DIAGNOSIS — Z9181 History of falling: Secondary | ICD-10-CM | POA: Diagnosis not present

## 2017-11-30 DIAGNOSIS — I110 Hypertensive heart disease with heart failure: Secondary | ICD-10-CM | POA: Diagnosis not present

## 2017-11-30 DIAGNOSIS — Z993 Dependence on wheelchair: Secondary | ICD-10-CM | POA: Diagnosis not present

## 2017-11-30 DIAGNOSIS — Z87440 Personal history of urinary (tract) infections: Secondary | ICD-10-CM | POA: Diagnosis not present

## 2017-11-30 DIAGNOSIS — J9611 Chronic respiratory failure with hypoxia: Secondary | ICD-10-CM | POA: Diagnosis not present

## 2017-11-30 DIAGNOSIS — J441 Chronic obstructive pulmonary disease with (acute) exacerbation: Secondary | ICD-10-CM | POA: Diagnosis not present

## 2017-12-02 DIAGNOSIS — Z9181 History of falling: Secondary | ICD-10-CM | POA: Diagnosis not present

## 2017-12-02 DIAGNOSIS — Z87440 Personal history of urinary (tract) infections: Secondary | ICD-10-CM | POA: Diagnosis not present

## 2017-12-02 DIAGNOSIS — I5032 Chronic diastolic (congestive) heart failure: Secondary | ICD-10-CM | POA: Diagnosis not present

## 2017-12-02 DIAGNOSIS — R296 Repeated falls: Secondary | ICD-10-CM | POA: Diagnosis not present

## 2017-12-02 DIAGNOSIS — I110 Hypertensive heart disease with heart failure: Secondary | ICD-10-CM | POA: Diagnosis not present

## 2017-12-02 DIAGNOSIS — J301 Allergic rhinitis due to pollen: Secondary | ICD-10-CM | POA: Diagnosis not present

## 2017-12-02 DIAGNOSIS — Z89612 Acquired absence of left leg above knee: Secondary | ICD-10-CM | POA: Diagnosis not present

## 2017-12-02 DIAGNOSIS — J9611 Chronic respiratory failure with hypoxia: Secondary | ICD-10-CM | POA: Diagnosis not present

## 2017-12-02 DIAGNOSIS — J189 Pneumonia, unspecified organism: Secondary | ICD-10-CM | POA: Diagnosis not present

## 2017-12-02 DIAGNOSIS — J4530 Mild persistent asthma, uncomplicated: Secondary | ICD-10-CM | POA: Diagnosis not present

## 2017-12-02 DIAGNOSIS — G4733 Obstructive sleep apnea (adult) (pediatric): Secondary | ICD-10-CM | POA: Diagnosis not present

## 2017-12-02 DIAGNOSIS — J441 Chronic obstructive pulmonary disease with (acute) exacerbation: Secondary | ICD-10-CM | POA: Diagnosis not present

## 2017-12-02 DIAGNOSIS — Z8673 Personal history of transient ischemic attack (TIA), and cerebral infarction without residual deficits: Secondary | ICD-10-CM | POA: Diagnosis not present

## 2017-12-02 DIAGNOSIS — Z9981 Dependence on supplemental oxygen: Secondary | ICD-10-CM | POA: Diagnosis not present

## 2017-12-02 DIAGNOSIS — Z993 Dependence on wheelchair: Secondary | ICD-10-CM | POA: Diagnosis not present

## 2017-12-02 DIAGNOSIS — M1991 Primary osteoarthritis, unspecified site: Secondary | ICD-10-CM | POA: Diagnosis not present

## 2017-12-05 DIAGNOSIS — J9611 Chronic respiratory failure with hypoxia: Secondary | ICD-10-CM | POA: Diagnosis not present

## 2017-12-05 DIAGNOSIS — Z9981 Dependence on supplemental oxygen: Secondary | ICD-10-CM | POA: Diagnosis not present

## 2017-12-05 DIAGNOSIS — Z87440 Personal history of urinary (tract) infections: Secondary | ICD-10-CM | POA: Diagnosis not present

## 2017-12-05 DIAGNOSIS — Z89612 Acquired absence of left leg above knee: Secondary | ICD-10-CM | POA: Diagnosis not present

## 2017-12-05 DIAGNOSIS — Z993 Dependence on wheelchair: Secondary | ICD-10-CM | POA: Diagnosis not present

## 2017-12-05 DIAGNOSIS — J4530 Mild persistent asthma, uncomplicated: Secondary | ICD-10-CM | POA: Diagnosis not present

## 2017-12-05 DIAGNOSIS — Z9181 History of falling: Secondary | ICD-10-CM | POA: Diagnosis not present

## 2017-12-05 DIAGNOSIS — J441 Chronic obstructive pulmonary disease with (acute) exacerbation: Secondary | ICD-10-CM | POA: Diagnosis not present

## 2017-12-05 DIAGNOSIS — J189 Pneumonia, unspecified organism: Secondary | ICD-10-CM | POA: Diagnosis not present

## 2017-12-05 DIAGNOSIS — M1991 Primary osteoarthritis, unspecified site: Secondary | ICD-10-CM | POA: Diagnosis not present

## 2017-12-05 DIAGNOSIS — R296 Repeated falls: Secondary | ICD-10-CM | POA: Diagnosis not present

## 2017-12-05 DIAGNOSIS — I110 Hypertensive heart disease with heart failure: Secondary | ICD-10-CM | POA: Diagnosis not present

## 2017-12-05 DIAGNOSIS — J301 Allergic rhinitis due to pollen: Secondary | ICD-10-CM | POA: Diagnosis not present

## 2017-12-05 DIAGNOSIS — Z8673 Personal history of transient ischemic attack (TIA), and cerebral infarction without residual deficits: Secondary | ICD-10-CM | POA: Diagnosis not present

## 2017-12-05 DIAGNOSIS — G4733 Obstructive sleep apnea (adult) (pediatric): Secondary | ICD-10-CM | POA: Diagnosis not present

## 2017-12-05 DIAGNOSIS — I5032 Chronic diastolic (congestive) heart failure: Secondary | ICD-10-CM | POA: Diagnosis not present

## 2017-12-06 DIAGNOSIS — G4733 Obstructive sleep apnea (adult) (pediatric): Secondary | ICD-10-CM | POA: Diagnosis not present

## 2017-12-06 DIAGNOSIS — J4530 Mild persistent asthma, uncomplicated: Secondary | ICD-10-CM | POA: Diagnosis not present

## 2017-12-06 DIAGNOSIS — Z89612 Acquired absence of left leg above knee: Secondary | ICD-10-CM | POA: Diagnosis not present

## 2017-12-06 DIAGNOSIS — J441 Chronic obstructive pulmonary disease with (acute) exacerbation: Secondary | ICD-10-CM | POA: Diagnosis not present

## 2017-12-06 DIAGNOSIS — Z8673 Personal history of transient ischemic attack (TIA), and cerebral infarction without residual deficits: Secondary | ICD-10-CM | POA: Diagnosis not present

## 2017-12-06 DIAGNOSIS — Z9181 History of falling: Secondary | ICD-10-CM | POA: Diagnosis not present

## 2017-12-06 DIAGNOSIS — I110 Hypertensive heart disease with heart failure: Secondary | ICD-10-CM | POA: Diagnosis not present

## 2017-12-06 DIAGNOSIS — R296 Repeated falls: Secondary | ICD-10-CM | POA: Diagnosis not present

## 2017-12-06 DIAGNOSIS — Z87440 Personal history of urinary (tract) infections: Secondary | ICD-10-CM | POA: Diagnosis not present

## 2017-12-06 DIAGNOSIS — Z993 Dependence on wheelchair: Secondary | ICD-10-CM | POA: Diagnosis not present

## 2017-12-06 DIAGNOSIS — I5032 Chronic diastolic (congestive) heart failure: Secondary | ICD-10-CM | POA: Diagnosis not present

## 2017-12-06 DIAGNOSIS — J189 Pneumonia, unspecified organism: Secondary | ICD-10-CM | POA: Diagnosis not present

## 2017-12-06 DIAGNOSIS — J9611 Chronic respiratory failure with hypoxia: Secondary | ICD-10-CM | POA: Diagnosis not present

## 2017-12-06 DIAGNOSIS — Z9981 Dependence on supplemental oxygen: Secondary | ICD-10-CM | POA: Diagnosis not present

## 2017-12-06 DIAGNOSIS — M1991 Primary osteoarthritis, unspecified site: Secondary | ICD-10-CM | POA: Diagnosis not present

## 2017-12-06 DIAGNOSIS — J301 Allergic rhinitis due to pollen: Secondary | ICD-10-CM | POA: Diagnosis not present

## 2017-12-07 DIAGNOSIS — E042 Nontoxic multinodular goiter: Secondary | ICD-10-CM | POA: Diagnosis not present

## 2017-12-07 DIAGNOSIS — J449 Chronic obstructive pulmonary disease, unspecified: Secondary | ICD-10-CM | POA: Diagnosis not present

## 2017-12-07 DIAGNOSIS — S88912D Complete traumatic amputation of left lower leg, level unspecified, subsequent encounter: Secondary | ICD-10-CM | POA: Diagnosis not present

## 2017-12-09 DIAGNOSIS — Z993 Dependence on wheelchair: Secondary | ICD-10-CM | POA: Diagnosis not present

## 2017-12-09 DIAGNOSIS — M1991 Primary osteoarthritis, unspecified site: Secondary | ICD-10-CM | POA: Diagnosis not present

## 2017-12-09 DIAGNOSIS — Z9981 Dependence on supplemental oxygen: Secondary | ICD-10-CM | POA: Diagnosis not present

## 2017-12-09 DIAGNOSIS — J301 Allergic rhinitis due to pollen: Secondary | ICD-10-CM | POA: Diagnosis not present

## 2017-12-09 DIAGNOSIS — J441 Chronic obstructive pulmonary disease with (acute) exacerbation: Secondary | ICD-10-CM | POA: Diagnosis not present

## 2017-12-09 DIAGNOSIS — J189 Pneumonia, unspecified organism: Secondary | ICD-10-CM | POA: Diagnosis not present

## 2017-12-09 DIAGNOSIS — Z89612 Acquired absence of left leg above knee: Secondary | ICD-10-CM | POA: Diagnosis not present

## 2017-12-09 DIAGNOSIS — G8194 Hemiplegia, unspecified affecting left nondominant side: Secondary | ICD-10-CM | POA: Diagnosis not present

## 2017-12-09 DIAGNOSIS — J4530 Mild persistent asthma, uncomplicated: Secondary | ICD-10-CM | POA: Diagnosis not present

## 2017-12-09 DIAGNOSIS — I110 Hypertensive heart disease with heart failure: Secondary | ICD-10-CM | POA: Diagnosis not present

## 2017-12-09 DIAGNOSIS — R296 Repeated falls: Secondary | ICD-10-CM | POA: Diagnosis not present

## 2017-12-09 DIAGNOSIS — R069 Unspecified abnormalities of breathing: Secondary | ICD-10-CM | POA: Diagnosis not present

## 2017-12-09 DIAGNOSIS — I5032 Chronic diastolic (congestive) heart failure: Secondary | ICD-10-CM | POA: Diagnosis not present

## 2017-12-09 DIAGNOSIS — Z8673 Personal history of transient ischemic attack (TIA), and cerebral infarction without residual deficits: Secondary | ICD-10-CM | POA: Diagnosis not present

## 2017-12-09 DIAGNOSIS — G4733 Obstructive sleep apnea (adult) (pediatric): Secondary | ICD-10-CM | POA: Diagnosis not present

## 2017-12-09 DIAGNOSIS — Z87440 Personal history of urinary (tract) infections: Secondary | ICD-10-CM | POA: Diagnosis not present

## 2017-12-09 DIAGNOSIS — Z9181 History of falling: Secondary | ICD-10-CM | POA: Diagnosis not present

## 2017-12-09 DIAGNOSIS — J9611 Chronic respiratory failure with hypoxia: Secondary | ICD-10-CM | POA: Diagnosis not present

## 2017-12-12 DIAGNOSIS — M25561 Pain in right knee: Secondary | ICD-10-CM | POA: Diagnosis not present

## 2017-12-12 DIAGNOSIS — M1711 Unilateral primary osteoarthritis, right knee: Secondary | ICD-10-CM | POA: Diagnosis not present

## 2017-12-13 DIAGNOSIS — E041 Nontoxic single thyroid nodule: Secondary | ICD-10-CM | POA: Diagnosis not present

## 2017-12-13 DIAGNOSIS — Z993 Dependence on wheelchair: Secondary | ICD-10-CM | POA: Diagnosis not present

## 2017-12-13 DIAGNOSIS — Z87440 Personal history of urinary (tract) infections: Secondary | ICD-10-CM | POA: Diagnosis not present

## 2017-12-13 DIAGNOSIS — Z9981 Dependence on supplemental oxygen: Secondary | ICD-10-CM | POA: Diagnosis not present

## 2017-12-13 DIAGNOSIS — J4530 Mild persistent asthma, uncomplicated: Secondary | ICD-10-CM | POA: Diagnosis not present

## 2017-12-13 DIAGNOSIS — J189 Pneumonia, unspecified organism: Secondary | ICD-10-CM | POA: Diagnosis not present

## 2017-12-13 DIAGNOSIS — M1991 Primary osteoarthritis, unspecified site: Secondary | ICD-10-CM | POA: Diagnosis not present

## 2017-12-13 DIAGNOSIS — G4733 Obstructive sleep apnea (adult) (pediatric): Secondary | ICD-10-CM | POA: Diagnosis not present

## 2017-12-13 DIAGNOSIS — J301 Allergic rhinitis due to pollen: Secondary | ICD-10-CM | POA: Diagnosis not present

## 2017-12-13 DIAGNOSIS — I110 Hypertensive heart disease with heart failure: Secondary | ICD-10-CM | POA: Diagnosis not present

## 2017-12-13 DIAGNOSIS — Z89612 Acquired absence of left leg above knee: Secondary | ICD-10-CM | POA: Diagnosis not present

## 2017-12-13 DIAGNOSIS — E042 Nontoxic multinodular goiter: Secondary | ICD-10-CM | POA: Diagnosis not present

## 2017-12-13 DIAGNOSIS — I5032 Chronic diastolic (congestive) heart failure: Secondary | ICD-10-CM | POA: Diagnosis not present

## 2017-12-13 DIAGNOSIS — R296 Repeated falls: Secondary | ICD-10-CM | POA: Diagnosis not present

## 2017-12-13 DIAGNOSIS — J441 Chronic obstructive pulmonary disease with (acute) exacerbation: Secondary | ICD-10-CM | POA: Diagnosis not present

## 2017-12-13 DIAGNOSIS — J9611 Chronic respiratory failure with hypoxia: Secondary | ICD-10-CM | POA: Diagnosis not present

## 2017-12-13 DIAGNOSIS — Z9181 History of falling: Secondary | ICD-10-CM | POA: Diagnosis not present

## 2017-12-13 DIAGNOSIS — Z8673 Personal history of transient ischemic attack (TIA), and cerebral infarction without residual deficits: Secondary | ICD-10-CM | POA: Diagnosis not present

## 2017-12-15 DIAGNOSIS — M542 Cervicalgia: Secondary | ICD-10-CM | POA: Diagnosis not present

## 2017-12-15 DIAGNOSIS — J449 Chronic obstructive pulmonary disease, unspecified: Secondary | ICD-10-CM | POA: Diagnosis not present

## 2017-12-15 DIAGNOSIS — E041 Nontoxic single thyroid nodule: Secondary | ICD-10-CM | POA: Diagnosis not present

## 2017-12-21 DIAGNOSIS — R296 Repeated falls: Secondary | ICD-10-CM | POA: Diagnosis not present

## 2017-12-21 DIAGNOSIS — G4733 Obstructive sleep apnea (adult) (pediatric): Secondary | ICD-10-CM | POA: Diagnosis not present

## 2017-12-21 DIAGNOSIS — I5032 Chronic diastolic (congestive) heart failure: Secondary | ICD-10-CM | POA: Diagnosis not present

## 2017-12-21 DIAGNOSIS — Z9981 Dependence on supplemental oxygen: Secondary | ICD-10-CM | POA: Diagnosis not present

## 2017-12-21 DIAGNOSIS — J301 Allergic rhinitis due to pollen: Secondary | ICD-10-CM | POA: Diagnosis not present

## 2017-12-21 DIAGNOSIS — Z8673 Personal history of transient ischemic attack (TIA), and cerebral infarction without residual deficits: Secondary | ICD-10-CM | POA: Diagnosis not present

## 2017-12-21 DIAGNOSIS — J4530 Mild persistent asthma, uncomplicated: Secondary | ICD-10-CM | POA: Diagnosis not present

## 2017-12-21 DIAGNOSIS — I110 Hypertensive heart disease with heart failure: Secondary | ICD-10-CM | POA: Diagnosis not present

## 2017-12-21 DIAGNOSIS — J441 Chronic obstructive pulmonary disease with (acute) exacerbation: Secondary | ICD-10-CM | POA: Diagnosis not present

## 2017-12-21 DIAGNOSIS — Z87440 Personal history of urinary (tract) infections: Secondary | ICD-10-CM | POA: Diagnosis not present

## 2017-12-21 DIAGNOSIS — J9611 Chronic respiratory failure with hypoxia: Secondary | ICD-10-CM | POA: Diagnosis not present

## 2017-12-21 DIAGNOSIS — M1991 Primary osteoarthritis, unspecified site: Secondary | ICD-10-CM | POA: Diagnosis not present

## 2017-12-21 DIAGNOSIS — Z9181 History of falling: Secondary | ICD-10-CM | POA: Diagnosis not present

## 2017-12-21 DIAGNOSIS — Z993 Dependence on wheelchair: Secondary | ICD-10-CM | POA: Diagnosis not present

## 2017-12-21 DIAGNOSIS — J189 Pneumonia, unspecified organism: Secondary | ICD-10-CM | POA: Diagnosis not present

## 2017-12-21 DIAGNOSIS — Z89612 Acquired absence of left leg above knee: Secondary | ICD-10-CM | POA: Diagnosis not present

## 2017-12-24 DIAGNOSIS — Z89611 Acquired absence of right leg above knee: Secondary | ICD-10-CM | POA: Diagnosis not present

## 2017-12-26 DIAGNOSIS — R5383 Other fatigue: Secondary | ICD-10-CM | POA: Diagnosis not present

## 2017-12-26 DIAGNOSIS — G4700 Insomnia, unspecified: Secondary | ICD-10-CM | POA: Diagnosis not present

## 2017-12-26 DIAGNOSIS — J301 Allergic rhinitis due to pollen: Secondary | ICD-10-CM | POA: Diagnosis not present

## 2017-12-26 DIAGNOSIS — J4540 Moderate persistent asthma, uncomplicated: Secondary | ICD-10-CM | POA: Diagnosis not present

## 2017-12-26 DIAGNOSIS — R918 Other nonspecific abnormal finding of lung field: Secondary | ICD-10-CM | POA: Diagnosis not present

## 2017-12-28 DIAGNOSIS — J449 Chronic obstructive pulmonary disease, unspecified: Secondary | ICD-10-CM | POA: Diagnosis not present

## 2017-12-29 DIAGNOSIS — R296 Repeated falls: Secondary | ICD-10-CM | POA: Diagnosis not present

## 2017-12-29 DIAGNOSIS — J301 Allergic rhinitis due to pollen: Secondary | ICD-10-CM | POA: Diagnosis not present

## 2017-12-29 DIAGNOSIS — Z9981 Dependence on supplemental oxygen: Secondary | ICD-10-CM | POA: Diagnosis not present

## 2017-12-29 DIAGNOSIS — Z993 Dependence on wheelchair: Secondary | ICD-10-CM | POA: Diagnosis not present

## 2017-12-29 DIAGNOSIS — J441 Chronic obstructive pulmonary disease with (acute) exacerbation: Secondary | ICD-10-CM | POA: Diagnosis not present

## 2017-12-29 DIAGNOSIS — M1991 Primary osteoarthritis, unspecified site: Secondary | ICD-10-CM | POA: Diagnosis not present

## 2017-12-29 DIAGNOSIS — J4530 Mild persistent asthma, uncomplicated: Secondary | ICD-10-CM | POA: Diagnosis not present

## 2017-12-29 DIAGNOSIS — Z8673 Personal history of transient ischemic attack (TIA), and cerebral infarction without residual deficits: Secondary | ICD-10-CM | POA: Diagnosis not present

## 2017-12-29 DIAGNOSIS — G4733 Obstructive sleep apnea (adult) (pediatric): Secondary | ICD-10-CM | POA: Diagnosis not present

## 2017-12-29 DIAGNOSIS — Z9181 History of falling: Secondary | ICD-10-CM | POA: Diagnosis not present

## 2017-12-29 DIAGNOSIS — Z89612 Acquired absence of left leg above knee: Secondary | ICD-10-CM | POA: Diagnosis not present

## 2017-12-29 DIAGNOSIS — J9611 Chronic respiratory failure with hypoxia: Secondary | ICD-10-CM | POA: Diagnosis not present

## 2017-12-29 DIAGNOSIS — Z87440 Personal history of urinary (tract) infections: Secondary | ICD-10-CM | POA: Diagnosis not present

## 2017-12-29 DIAGNOSIS — I5032 Chronic diastolic (congestive) heart failure: Secondary | ICD-10-CM | POA: Diagnosis not present

## 2017-12-29 DIAGNOSIS — I110 Hypertensive heart disease with heart failure: Secondary | ICD-10-CM | POA: Diagnosis not present

## 2018-01-02 DIAGNOSIS — Z8673 Personal history of transient ischemic attack (TIA), and cerebral infarction without residual deficits: Secondary | ICD-10-CM | POA: Diagnosis not present

## 2018-01-02 DIAGNOSIS — Z89612 Acquired absence of left leg above knee: Secondary | ICD-10-CM | POA: Diagnosis not present

## 2018-01-02 DIAGNOSIS — G4733 Obstructive sleep apnea (adult) (pediatric): Secondary | ICD-10-CM | POA: Diagnosis not present

## 2018-01-02 DIAGNOSIS — Z9181 History of falling: Secondary | ICD-10-CM | POA: Diagnosis not present

## 2018-01-02 DIAGNOSIS — R296 Repeated falls: Secondary | ICD-10-CM | POA: Diagnosis not present

## 2018-01-02 DIAGNOSIS — J189 Pneumonia, unspecified organism: Secondary | ICD-10-CM | POA: Diagnosis not present

## 2018-01-02 DIAGNOSIS — J441 Chronic obstructive pulmonary disease with (acute) exacerbation: Secondary | ICD-10-CM | POA: Diagnosis not present

## 2018-01-02 DIAGNOSIS — Z1231 Encounter for screening mammogram for malignant neoplasm of breast: Secondary | ICD-10-CM | POA: Diagnosis not present

## 2018-01-02 DIAGNOSIS — M1991 Primary osteoarthritis, unspecified site: Secondary | ICD-10-CM | POA: Diagnosis not present

## 2018-01-02 DIAGNOSIS — Z9981 Dependence on supplemental oxygen: Secondary | ICD-10-CM | POA: Diagnosis not present

## 2018-01-02 DIAGNOSIS — J9611 Chronic respiratory failure with hypoxia: Secondary | ICD-10-CM | POA: Diagnosis not present

## 2018-01-02 DIAGNOSIS — I110 Hypertensive heart disease with heart failure: Secondary | ICD-10-CM | POA: Diagnosis not present

## 2018-01-02 DIAGNOSIS — J4530 Mild persistent asthma, uncomplicated: Secondary | ICD-10-CM | POA: Diagnosis not present

## 2018-01-02 DIAGNOSIS — I5032 Chronic diastolic (congestive) heart failure: Secondary | ICD-10-CM | POA: Diagnosis not present

## 2018-01-02 DIAGNOSIS — Z87440 Personal history of urinary (tract) infections: Secondary | ICD-10-CM | POA: Diagnosis not present

## 2018-01-02 DIAGNOSIS — M810 Age-related osteoporosis without current pathological fracture: Secondary | ICD-10-CM | POA: Diagnosis not present

## 2018-01-02 DIAGNOSIS — Z993 Dependence on wheelchair: Secondary | ICD-10-CM | POA: Diagnosis not present

## 2018-01-02 DIAGNOSIS — J301 Allergic rhinitis due to pollen: Secondary | ICD-10-CM | POA: Diagnosis not present

## 2018-01-02 DIAGNOSIS — R05 Cough: Secondary | ICD-10-CM | POA: Diagnosis not present

## 2018-01-04 DIAGNOSIS — J449 Chronic obstructive pulmonary disease, unspecified: Secondary | ICD-10-CM | POA: Diagnosis not present

## 2018-01-06 DIAGNOSIS — J449 Chronic obstructive pulmonary disease, unspecified: Secondary | ICD-10-CM | POA: Diagnosis not present

## 2018-01-06 DIAGNOSIS — Z9981 Dependence on supplemental oxygen: Secondary | ICD-10-CM | POA: Diagnosis not present

## 2018-01-06 DIAGNOSIS — E042 Nontoxic multinodular goiter: Secondary | ICD-10-CM | POA: Diagnosis not present

## 2018-01-06 DIAGNOSIS — H60311 Diffuse otitis externa, right ear: Secondary | ICD-10-CM | POA: Diagnosis not present

## 2018-01-06 DIAGNOSIS — H9201 Otalgia, right ear: Secondary | ICD-10-CM | POA: Diagnosis not present

## 2018-01-08 DIAGNOSIS — R069 Unspecified abnormalities of breathing: Secondary | ICD-10-CM | POA: Diagnosis not present

## 2018-01-08 DIAGNOSIS — G8194 Hemiplegia, unspecified affecting left nondominant side: Secondary | ICD-10-CM | POA: Diagnosis not present

## 2018-01-09 DIAGNOSIS — G4733 Obstructive sleep apnea (adult) (pediatric): Secondary | ICD-10-CM | POA: Diagnosis not present

## 2018-01-09 DIAGNOSIS — Z89612 Acquired absence of left leg above knee: Secondary | ICD-10-CM | POA: Diagnosis not present

## 2018-01-09 DIAGNOSIS — J4530 Mild persistent asthma, uncomplicated: Secondary | ICD-10-CM | POA: Diagnosis not present

## 2018-01-09 DIAGNOSIS — I110 Hypertensive heart disease with heart failure: Secondary | ICD-10-CM | POA: Diagnosis not present

## 2018-01-09 DIAGNOSIS — Z9981 Dependence on supplemental oxygen: Secondary | ICD-10-CM | POA: Diagnosis not present

## 2018-01-09 DIAGNOSIS — M1991 Primary osteoarthritis, unspecified site: Secondary | ICD-10-CM | POA: Diagnosis not present

## 2018-01-09 DIAGNOSIS — Z8673 Personal history of transient ischemic attack (TIA), and cerebral infarction without residual deficits: Secondary | ICD-10-CM | POA: Diagnosis not present

## 2018-01-09 DIAGNOSIS — Z993 Dependence on wheelchair: Secondary | ICD-10-CM | POA: Diagnosis not present

## 2018-01-09 DIAGNOSIS — J441 Chronic obstructive pulmonary disease with (acute) exacerbation: Secondary | ICD-10-CM | POA: Diagnosis not present

## 2018-01-09 DIAGNOSIS — R296 Repeated falls: Secondary | ICD-10-CM | POA: Diagnosis not present

## 2018-01-09 DIAGNOSIS — Z87440 Personal history of urinary (tract) infections: Secondary | ICD-10-CM | POA: Diagnosis not present

## 2018-01-09 DIAGNOSIS — J301 Allergic rhinitis due to pollen: Secondary | ICD-10-CM | POA: Diagnosis not present

## 2018-01-09 DIAGNOSIS — I5032 Chronic diastolic (congestive) heart failure: Secondary | ICD-10-CM | POA: Diagnosis not present

## 2018-01-09 DIAGNOSIS — J9611 Chronic respiratory failure with hypoxia: Secondary | ICD-10-CM | POA: Diagnosis not present

## 2018-01-09 DIAGNOSIS — Z9181 History of falling: Secondary | ICD-10-CM | POA: Diagnosis not present

## 2018-01-12 DIAGNOSIS — J449 Chronic obstructive pulmonary disease, unspecified: Secondary | ICD-10-CM | POA: Diagnosis not present

## 2018-01-12 DIAGNOSIS — M810 Age-related osteoporosis without current pathological fracture: Secondary | ICD-10-CM | POA: Diagnosis not present

## 2018-01-12 DIAGNOSIS — R350 Frequency of micturition: Secondary | ICD-10-CM | POA: Diagnosis not present

## 2018-01-12 DIAGNOSIS — N390 Urinary tract infection, site not specified: Secondary | ICD-10-CM | POA: Diagnosis not present

## 2018-01-13 DIAGNOSIS — H9201 Otalgia, right ear: Secondary | ICD-10-CM | POA: Diagnosis not present

## 2018-01-13 DIAGNOSIS — M26609 Unspecified temporomandibular joint disorder, unspecified side: Secondary | ICD-10-CM | POA: Diagnosis not present

## 2018-01-13 DIAGNOSIS — E041 Nontoxic single thyroid nodule: Secondary | ICD-10-CM | POA: Diagnosis not present

## 2018-01-16 DIAGNOSIS — G4733 Obstructive sleep apnea (adult) (pediatric): Secondary | ICD-10-CM | POA: Diagnosis not present

## 2018-01-16 DIAGNOSIS — J4530 Mild persistent asthma, uncomplicated: Secondary | ICD-10-CM | POA: Diagnosis not present

## 2018-01-16 DIAGNOSIS — J9611 Chronic respiratory failure with hypoxia: Secondary | ICD-10-CM | POA: Diagnosis not present

## 2018-01-16 DIAGNOSIS — Z87440 Personal history of urinary (tract) infections: Secondary | ICD-10-CM | POA: Diagnosis not present

## 2018-01-16 DIAGNOSIS — Z9981 Dependence on supplemental oxygen: Secondary | ICD-10-CM | POA: Diagnosis not present

## 2018-01-16 DIAGNOSIS — J301 Allergic rhinitis due to pollen: Secondary | ICD-10-CM | POA: Diagnosis not present

## 2018-01-16 DIAGNOSIS — I110 Hypertensive heart disease with heart failure: Secondary | ICD-10-CM | POA: Diagnosis not present

## 2018-01-16 DIAGNOSIS — I5032 Chronic diastolic (congestive) heart failure: Secondary | ICD-10-CM | POA: Diagnosis not present

## 2018-01-16 DIAGNOSIS — Z9181 History of falling: Secondary | ICD-10-CM | POA: Diagnosis not present

## 2018-01-16 DIAGNOSIS — Z8673 Personal history of transient ischemic attack (TIA), and cerebral infarction without residual deficits: Secondary | ICD-10-CM | POA: Diagnosis not present

## 2018-01-16 DIAGNOSIS — R296 Repeated falls: Secondary | ICD-10-CM | POA: Diagnosis not present

## 2018-01-16 DIAGNOSIS — J441 Chronic obstructive pulmonary disease with (acute) exacerbation: Secondary | ICD-10-CM | POA: Diagnosis not present

## 2018-01-16 DIAGNOSIS — Z993 Dependence on wheelchair: Secondary | ICD-10-CM | POA: Diagnosis not present

## 2018-01-16 DIAGNOSIS — Z89612 Acquired absence of left leg above knee: Secondary | ICD-10-CM | POA: Diagnosis not present

## 2018-01-16 DIAGNOSIS — M1991 Primary osteoarthritis, unspecified site: Secondary | ICD-10-CM | POA: Diagnosis not present

## 2018-01-17 DIAGNOSIS — M1991 Primary osteoarthritis, unspecified site: Secondary | ICD-10-CM | POA: Diagnosis not present

## 2018-01-17 DIAGNOSIS — J4530 Mild persistent asthma, uncomplicated: Secondary | ICD-10-CM | POA: Diagnosis not present

## 2018-01-17 DIAGNOSIS — G4733 Obstructive sleep apnea (adult) (pediatric): Secondary | ICD-10-CM | POA: Diagnosis not present

## 2018-01-17 DIAGNOSIS — I110 Hypertensive heart disease with heart failure: Secondary | ICD-10-CM | POA: Diagnosis not present

## 2018-01-17 DIAGNOSIS — Z8673 Personal history of transient ischemic attack (TIA), and cerebral infarction without residual deficits: Secondary | ICD-10-CM | POA: Diagnosis not present

## 2018-01-17 DIAGNOSIS — J301 Allergic rhinitis due to pollen: Secondary | ICD-10-CM | POA: Diagnosis not present

## 2018-01-17 DIAGNOSIS — J9611 Chronic respiratory failure with hypoxia: Secondary | ICD-10-CM | POA: Diagnosis not present

## 2018-01-17 DIAGNOSIS — Z9181 History of falling: Secondary | ICD-10-CM | POA: Diagnosis not present

## 2018-01-17 DIAGNOSIS — Z9981 Dependence on supplemental oxygen: Secondary | ICD-10-CM | POA: Diagnosis not present

## 2018-01-17 DIAGNOSIS — Z993 Dependence on wheelchair: Secondary | ICD-10-CM | POA: Diagnosis not present

## 2018-01-17 DIAGNOSIS — R296 Repeated falls: Secondary | ICD-10-CM | POA: Diagnosis not present

## 2018-01-17 DIAGNOSIS — Z87440 Personal history of urinary (tract) infections: Secondary | ICD-10-CM | POA: Diagnosis not present

## 2018-01-17 DIAGNOSIS — Z89612 Acquired absence of left leg above knee: Secondary | ICD-10-CM | POA: Diagnosis not present

## 2018-01-17 DIAGNOSIS — J441 Chronic obstructive pulmonary disease with (acute) exacerbation: Secondary | ICD-10-CM | POA: Diagnosis not present

## 2018-01-17 DIAGNOSIS — I5032 Chronic diastolic (congestive) heart failure: Secondary | ICD-10-CM | POA: Diagnosis not present

## 2018-01-18 DIAGNOSIS — I110 Hypertensive heart disease with heart failure: Secondary | ICD-10-CM | POA: Diagnosis not present

## 2018-01-18 DIAGNOSIS — I5032 Chronic diastolic (congestive) heart failure: Secondary | ICD-10-CM | POA: Diagnosis not present

## 2018-01-18 DIAGNOSIS — J9611 Chronic respiratory failure with hypoxia: Secondary | ICD-10-CM | POA: Diagnosis not present

## 2018-01-18 DIAGNOSIS — G4733 Obstructive sleep apnea (adult) (pediatric): Secondary | ICD-10-CM | POA: Diagnosis not present

## 2018-01-18 DIAGNOSIS — Z9181 History of falling: Secondary | ICD-10-CM | POA: Diagnosis not present

## 2018-01-18 DIAGNOSIS — Z993 Dependence on wheelchair: Secondary | ICD-10-CM | POA: Diagnosis not present

## 2018-01-18 DIAGNOSIS — J441 Chronic obstructive pulmonary disease with (acute) exacerbation: Secondary | ICD-10-CM | POA: Diagnosis not present

## 2018-01-18 DIAGNOSIS — Z89612 Acquired absence of left leg above knee: Secondary | ICD-10-CM | POA: Diagnosis not present

## 2018-01-18 DIAGNOSIS — Z8673 Personal history of transient ischemic attack (TIA), and cerebral infarction without residual deficits: Secondary | ICD-10-CM | POA: Diagnosis not present

## 2018-01-18 DIAGNOSIS — Z9981 Dependence on supplemental oxygen: Secondary | ICD-10-CM | POA: Diagnosis not present

## 2018-01-18 DIAGNOSIS — M1991 Primary osteoarthritis, unspecified site: Secondary | ICD-10-CM | POA: Diagnosis not present

## 2018-01-18 DIAGNOSIS — J301 Allergic rhinitis due to pollen: Secondary | ICD-10-CM | POA: Diagnosis not present

## 2018-01-18 DIAGNOSIS — R296 Repeated falls: Secondary | ICD-10-CM | POA: Diagnosis not present

## 2018-01-18 DIAGNOSIS — Z87440 Personal history of urinary (tract) infections: Secondary | ICD-10-CM | POA: Diagnosis not present

## 2018-01-18 DIAGNOSIS — J4530 Mild persistent asthma, uncomplicated: Secondary | ICD-10-CM | POA: Diagnosis not present

## 2018-01-20 DIAGNOSIS — M1991 Primary osteoarthritis, unspecified site: Secondary | ICD-10-CM | POA: Diagnosis not present

## 2018-01-20 DIAGNOSIS — J441 Chronic obstructive pulmonary disease with (acute) exacerbation: Secondary | ICD-10-CM | POA: Diagnosis not present

## 2018-01-20 DIAGNOSIS — R296 Repeated falls: Secondary | ICD-10-CM | POA: Diagnosis not present

## 2018-01-20 DIAGNOSIS — I5032 Chronic diastolic (congestive) heart failure: Secondary | ICD-10-CM | POA: Diagnosis not present

## 2018-01-20 DIAGNOSIS — Z9181 History of falling: Secondary | ICD-10-CM | POA: Diagnosis not present

## 2018-01-20 DIAGNOSIS — I110 Hypertensive heart disease with heart failure: Secondary | ICD-10-CM | POA: Diagnosis not present

## 2018-01-20 DIAGNOSIS — J301 Allergic rhinitis due to pollen: Secondary | ICD-10-CM | POA: Diagnosis not present

## 2018-01-20 DIAGNOSIS — J4530 Mild persistent asthma, uncomplicated: Secondary | ICD-10-CM | POA: Diagnosis not present

## 2018-01-20 DIAGNOSIS — Z9981 Dependence on supplemental oxygen: Secondary | ICD-10-CM | POA: Diagnosis not present

## 2018-01-20 DIAGNOSIS — Z8673 Personal history of transient ischemic attack (TIA), and cerebral infarction without residual deficits: Secondary | ICD-10-CM | POA: Diagnosis not present

## 2018-01-20 DIAGNOSIS — Z89612 Acquired absence of left leg above knee: Secondary | ICD-10-CM | POA: Diagnosis not present

## 2018-01-20 DIAGNOSIS — Z993 Dependence on wheelchair: Secondary | ICD-10-CM | POA: Diagnosis not present

## 2018-01-20 DIAGNOSIS — J9611 Chronic respiratory failure with hypoxia: Secondary | ICD-10-CM | POA: Diagnosis not present

## 2018-01-20 DIAGNOSIS — Z87440 Personal history of urinary (tract) infections: Secondary | ICD-10-CM | POA: Diagnosis not present

## 2018-01-20 DIAGNOSIS — G4733 Obstructive sleep apnea (adult) (pediatric): Secondary | ICD-10-CM | POA: Diagnosis not present

## 2018-01-23 DIAGNOSIS — Z89611 Acquired absence of right leg above knee: Secondary | ICD-10-CM | POA: Diagnosis not present

## 2018-01-24 DIAGNOSIS — Z8673 Personal history of transient ischemic attack (TIA), and cerebral infarction without residual deficits: Secondary | ICD-10-CM | POA: Diagnosis not present

## 2018-01-24 DIAGNOSIS — J9611 Chronic respiratory failure with hypoxia: Secondary | ICD-10-CM | POA: Diagnosis not present

## 2018-01-24 DIAGNOSIS — Z9181 History of falling: Secondary | ICD-10-CM | POA: Diagnosis not present

## 2018-01-24 DIAGNOSIS — J301 Allergic rhinitis due to pollen: Secondary | ICD-10-CM | POA: Diagnosis not present

## 2018-01-24 DIAGNOSIS — M1991 Primary osteoarthritis, unspecified site: Secondary | ICD-10-CM | POA: Diagnosis not present

## 2018-01-24 DIAGNOSIS — J441 Chronic obstructive pulmonary disease with (acute) exacerbation: Secondary | ICD-10-CM | POA: Diagnosis not present

## 2018-01-24 DIAGNOSIS — G4733 Obstructive sleep apnea (adult) (pediatric): Secondary | ICD-10-CM | POA: Diagnosis not present

## 2018-01-24 DIAGNOSIS — I110 Hypertensive heart disease with heart failure: Secondary | ICD-10-CM | POA: Diagnosis not present

## 2018-01-24 DIAGNOSIS — R296 Repeated falls: Secondary | ICD-10-CM | POA: Diagnosis not present

## 2018-01-24 DIAGNOSIS — Z89612 Acquired absence of left leg above knee: Secondary | ICD-10-CM | POA: Diagnosis not present

## 2018-01-24 DIAGNOSIS — I5032 Chronic diastolic (congestive) heart failure: Secondary | ICD-10-CM | POA: Diagnosis not present

## 2018-01-24 DIAGNOSIS — J4530 Mild persistent asthma, uncomplicated: Secondary | ICD-10-CM | POA: Diagnosis not present

## 2018-01-24 DIAGNOSIS — Z993 Dependence on wheelchair: Secondary | ICD-10-CM | POA: Diagnosis not present

## 2018-01-24 DIAGNOSIS — Z9981 Dependence on supplemental oxygen: Secondary | ICD-10-CM | POA: Diagnosis not present

## 2018-01-24 DIAGNOSIS — Z87440 Personal history of urinary (tract) infections: Secondary | ICD-10-CM | POA: Diagnosis not present

## 2018-01-25 DIAGNOSIS — M779 Enthesopathy, unspecified: Secondary | ICD-10-CM | POA: Diagnosis not present

## 2018-01-25 DIAGNOSIS — M25531 Pain in right wrist: Secondary | ICD-10-CM | POA: Diagnosis not present

## 2018-01-25 DIAGNOSIS — M85871 Other specified disorders of bone density and structure, right ankle and foot: Secondary | ICD-10-CM | POA: Diagnosis not present

## 2018-01-25 DIAGNOSIS — M7989 Other specified soft tissue disorders: Secondary | ICD-10-CM | POA: Diagnosis not present

## 2018-01-25 DIAGNOSIS — M19031 Primary osteoarthritis, right wrist: Secondary | ICD-10-CM | POA: Diagnosis not present

## 2018-01-25 DIAGNOSIS — M7731 Calcaneal spur, right foot: Secondary | ICD-10-CM | POA: Diagnosis not present

## 2018-01-25 DIAGNOSIS — M79671 Pain in right foot: Secondary | ICD-10-CM | POA: Diagnosis not present

## 2018-01-28 DIAGNOSIS — Z89612 Acquired absence of left leg above knee: Secondary | ICD-10-CM | POA: Diagnosis not present

## 2018-01-28 DIAGNOSIS — Z993 Dependence on wheelchair: Secondary | ICD-10-CM | POA: Diagnosis not present

## 2018-01-28 DIAGNOSIS — G4733 Obstructive sleep apnea (adult) (pediatric): Secondary | ICD-10-CM | POA: Diagnosis not present

## 2018-01-28 DIAGNOSIS — Z8673 Personal history of transient ischemic attack (TIA), and cerebral infarction without residual deficits: Secondary | ICD-10-CM | POA: Diagnosis not present

## 2018-01-28 DIAGNOSIS — J449 Chronic obstructive pulmonary disease, unspecified: Secondary | ICD-10-CM | POA: Diagnosis not present

## 2018-01-28 DIAGNOSIS — M79671 Pain in right foot: Secondary | ICD-10-CM | POA: Diagnosis not present

## 2018-01-28 DIAGNOSIS — Z79899 Other long term (current) drug therapy: Secondary | ICD-10-CM | POA: Diagnosis not present

## 2018-01-28 DIAGNOSIS — R229 Localized swelling, mass and lump, unspecified: Secondary | ICD-10-CM | POA: Diagnosis not present

## 2018-01-28 DIAGNOSIS — S9031XA Contusion of right foot, initial encounter: Secondary | ICD-10-CM | POA: Diagnosis not present

## 2018-02-01 DIAGNOSIS — M7989 Other specified soft tissue disorders: Secondary | ICD-10-CM | POA: Diagnosis not present

## 2018-02-01 DIAGNOSIS — R600 Localized edema: Secondary | ICD-10-CM | POA: Diagnosis not present

## 2018-02-08 DIAGNOSIS — G8194 Hemiplegia, unspecified affecting left nondominant side: Secondary | ICD-10-CM | POA: Diagnosis not present

## 2018-02-08 DIAGNOSIS — R069 Unspecified abnormalities of breathing: Secondary | ICD-10-CM | POA: Diagnosis not present

## 2018-02-13 DIAGNOSIS — E785 Hyperlipidemia, unspecified: Secondary | ICD-10-CM | POA: Diagnosis not present

## 2018-02-13 DIAGNOSIS — Z79899 Other long term (current) drug therapy: Secondary | ICD-10-CM | POA: Diagnosis not present

## 2018-02-13 DIAGNOSIS — Z1339 Encounter for screening examination for other mental health and behavioral disorders: Secondary | ICD-10-CM | POA: Diagnosis not present

## 2018-02-13 DIAGNOSIS — R739 Hyperglycemia, unspecified: Secondary | ICD-10-CM | POA: Diagnosis not present

## 2018-02-13 DIAGNOSIS — M79671 Pain in right foot: Secondary | ICD-10-CM | POA: Diagnosis not present

## 2018-02-14 DIAGNOSIS — S9031XA Contusion of right foot, initial encounter: Secondary | ICD-10-CM | POA: Diagnosis not present

## 2018-02-27 DIAGNOSIS — J449 Chronic obstructive pulmonary disease, unspecified: Secondary | ICD-10-CM | POA: Diagnosis not present

## 2018-02-27 DIAGNOSIS — J4540 Moderate persistent asthma, uncomplicated: Secondary | ICD-10-CM | POA: Diagnosis not present

## 2018-02-27 DIAGNOSIS — J301 Allergic rhinitis due to pollen: Secondary | ICD-10-CM | POA: Diagnosis not present

## 2018-02-27 DIAGNOSIS — G4733 Obstructive sleep apnea (adult) (pediatric): Secondary | ICD-10-CM | POA: Diagnosis not present

## 2018-02-27 DIAGNOSIS — G4700 Insomnia, unspecified: Secondary | ICD-10-CM | POA: Diagnosis not present

## 2018-02-27 DIAGNOSIS — R918 Other nonspecific abnormal finding of lung field: Secondary | ICD-10-CM | POA: Diagnosis not present

## 2018-03-06 DIAGNOSIS — G4733 Obstructive sleep apnea (adult) (pediatric): Secondary | ICD-10-CM | POA: Diagnosis not present

## 2018-03-06 DIAGNOSIS — J4540 Moderate persistent asthma, uncomplicated: Secondary | ICD-10-CM | POA: Diagnosis not present

## 2018-03-06 DIAGNOSIS — R5383 Other fatigue: Secondary | ICD-10-CM | POA: Diagnosis not present

## 2018-03-06 DIAGNOSIS — J301 Allergic rhinitis due to pollen: Secondary | ICD-10-CM | POA: Diagnosis not present

## 2018-03-10 DIAGNOSIS — R069 Unspecified abnormalities of breathing: Secondary | ICD-10-CM | POA: Diagnosis not present

## 2018-03-10 DIAGNOSIS — G8194 Hemiplegia, unspecified affecting left nondominant side: Secondary | ICD-10-CM | POA: Diagnosis not present

## 2018-03-15 DIAGNOSIS — G4733 Obstructive sleep apnea (adult) (pediatric): Secondary | ICD-10-CM | POA: Diagnosis not present

## 2018-03-15 DIAGNOSIS — M1711 Unilateral primary osteoarthritis, right knee: Secondary | ICD-10-CM | POA: Diagnosis not present

## 2018-03-30 DIAGNOSIS — J449 Chronic obstructive pulmonary disease, unspecified: Secondary | ICD-10-CM | POA: Diagnosis not present

## 2018-03-30 DIAGNOSIS — Z79899 Other long term (current) drug therapy: Secondary | ICD-10-CM | POA: Diagnosis not present

## 2018-03-30 DIAGNOSIS — G4730 Sleep apnea, unspecified: Secondary | ICD-10-CM | POA: Diagnosis not present

## 2018-03-30 DIAGNOSIS — N390 Urinary tract infection, site not specified: Secondary | ICD-10-CM | POA: Diagnosis not present

## 2018-03-30 DIAGNOSIS — R350 Frequency of micturition: Secondary | ICD-10-CM | POA: Diagnosis not present

## 2018-04-10 DIAGNOSIS — R069 Unspecified abnormalities of breathing: Secondary | ICD-10-CM | POA: Diagnosis not present

## 2018-04-10 DIAGNOSIS — G8194 Hemiplegia, unspecified affecting left nondominant side: Secondary | ICD-10-CM | POA: Diagnosis not present

## 2018-04-15 DIAGNOSIS — G4733 Obstructive sleep apnea (adult) (pediatric): Secondary | ICD-10-CM | POA: Diagnosis not present

## 2018-04-24 DIAGNOSIS — R918 Other nonspecific abnormal finding of lung field: Secondary | ICD-10-CM | POA: Diagnosis not present

## 2018-04-24 DIAGNOSIS — G4733 Obstructive sleep apnea (adult) (pediatric): Secondary | ICD-10-CM | POA: Diagnosis not present

## 2018-04-24 DIAGNOSIS — R5383 Other fatigue: Secondary | ICD-10-CM | POA: Diagnosis not present

## 2018-04-24 DIAGNOSIS — J4540 Moderate persistent asthma, uncomplicated: Secondary | ICD-10-CM | POA: Diagnosis not present

## 2018-04-24 DIAGNOSIS — J301 Allergic rhinitis due to pollen: Secondary | ICD-10-CM | POA: Diagnosis not present

## 2018-04-30 DIAGNOSIS — J449 Chronic obstructive pulmonary disease, unspecified: Secondary | ICD-10-CM | POA: Diagnosis not present

## 2018-05-02 DIAGNOSIS — M25561 Pain in right knee: Secondary | ICD-10-CM | POA: Diagnosis not present

## 2018-05-02 DIAGNOSIS — R51 Headache: Secondary | ICD-10-CM | POA: Diagnosis not present

## 2018-05-02 DIAGNOSIS — W19XXXA Unspecified fall, initial encounter: Secondary | ICD-10-CM | POA: Diagnosis not present

## 2018-05-02 DIAGNOSIS — Z79899 Other long term (current) drug therapy: Secondary | ICD-10-CM | POA: Diagnosis not present

## 2018-05-02 DIAGNOSIS — S8991XA Unspecified injury of right lower leg, initial encounter: Secondary | ICD-10-CM | POA: Diagnosis not present

## 2018-05-02 DIAGNOSIS — M25562 Pain in left knee: Secondary | ICD-10-CM | POA: Diagnosis not present

## 2018-05-02 DIAGNOSIS — R1031 Right lower quadrant pain: Secondary | ICD-10-CM | POA: Diagnosis not present

## 2018-05-02 DIAGNOSIS — M25511 Pain in right shoulder: Secondary | ICD-10-CM | POA: Diagnosis not present

## 2018-05-02 DIAGNOSIS — Z9981 Dependence on supplemental oxygen: Secondary | ICD-10-CM | POA: Diagnosis not present

## 2018-05-02 DIAGNOSIS — S0990XA Unspecified injury of head, initial encounter: Secondary | ICD-10-CM | POA: Diagnosis not present

## 2018-05-02 DIAGNOSIS — J189 Pneumonia, unspecified organism: Secondary | ICD-10-CM | POA: Diagnosis not present

## 2018-05-02 DIAGNOSIS — R0902 Hypoxemia: Secondary | ICD-10-CM | POA: Diagnosis not present

## 2018-05-02 DIAGNOSIS — S79911A Unspecified injury of right hip, initial encounter: Secondary | ICD-10-CM | POA: Diagnosis not present

## 2018-05-02 DIAGNOSIS — M25551 Pain in right hip: Secondary | ICD-10-CM | POA: Diagnosis not present

## 2018-05-02 DIAGNOSIS — R55 Syncope and collapse: Secondary | ICD-10-CM | POA: Diagnosis not present

## 2018-05-03 DIAGNOSIS — J189 Pneumonia, unspecified organism: Secondary | ICD-10-CM | POA: Diagnosis not present

## 2018-05-03 DIAGNOSIS — I10 Essential (primary) hypertension: Secondary | ICD-10-CM | POA: Diagnosis not present

## 2018-05-08 DIAGNOSIS — G4733 Obstructive sleep apnea (adult) (pediatric): Secondary | ICD-10-CM | POA: Diagnosis not present

## 2018-05-10 DIAGNOSIS — S7011XA Contusion of right thigh, initial encounter: Secondary | ICD-10-CM | POA: Diagnosis not present

## 2018-05-10 DIAGNOSIS — S79911A Unspecified injury of right hip, initial encounter: Secondary | ICD-10-CM | POA: Diagnosis not present

## 2018-05-10 DIAGNOSIS — S7001XA Contusion of right hip, initial encounter: Secondary | ICD-10-CM | POA: Diagnosis not present

## 2018-05-10 DIAGNOSIS — M25561 Pain in right knee: Secondary | ICD-10-CM | POA: Diagnosis not present

## 2018-05-10 DIAGNOSIS — W19XXXA Unspecified fall, initial encounter: Secondary | ICD-10-CM | POA: Diagnosis not present

## 2018-05-10 DIAGNOSIS — M25519 Pain in unspecified shoulder: Secondary | ICD-10-CM | POA: Diagnosis not present

## 2018-05-10 DIAGNOSIS — R0902 Hypoxemia: Secondary | ICD-10-CM | POA: Diagnosis not present

## 2018-05-10 DIAGNOSIS — S79921A Unspecified injury of right thigh, initial encounter: Secondary | ICD-10-CM | POA: Diagnosis not present

## 2018-05-10 DIAGNOSIS — M25551 Pain in right hip: Secondary | ICD-10-CM | POA: Diagnosis not present

## 2018-05-10 DIAGNOSIS — S8991XA Unspecified injury of right lower leg, initial encounter: Secondary | ICD-10-CM | POA: Diagnosis not present

## 2018-05-11 DIAGNOSIS — R069 Unspecified abnormalities of breathing: Secondary | ICD-10-CM | POA: Diagnosis not present

## 2018-05-11 DIAGNOSIS — G8194 Hemiplegia, unspecified affecting left nondominant side: Secondary | ICD-10-CM | POA: Diagnosis not present

## 2018-05-15 DIAGNOSIS — M1711 Unilateral primary osteoarthritis, right knee: Secondary | ICD-10-CM | POA: Diagnosis not present

## 2018-05-16 DIAGNOSIS — G4733 Obstructive sleep apnea (adult) (pediatric): Secondary | ICD-10-CM | POA: Diagnosis not present

## 2018-05-18 DIAGNOSIS — M25561 Pain in right knee: Secondary | ICD-10-CM | POA: Diagnosis not present

## 2018-05-18 DIAGNOSIS — Z23 Encounter for immunization: Secondary | ICD-10-CM | POA: Diagnosis not present

## 2018-05-18 DIAGNOSIS — W19XXXA Unspecified fall, initial encounter: Secondary | ICD-10-CM | POA: Diagnosis not present

## 2018-05-18 DIAGNOSIS — J449 Chronic obstructive pulmonary disease, unspecified: Secondary | ICD-10-CM | POA: Diagnosis not present

## 2018-05-22 DIAGNOSIS — M1711 Unilateral primary osteoarthritis, right knee: Secondary | ICD-10-CM | POA: Diagnosis not present

## 2018-05-24 DIAGNOSIS — E785 Hyperlipidemia, unspecified: Secondary | ICD-10-CM | POA: Diagnosis not present

## 2018-05-24 DIAGNOSIS — Z1211 Encounter for screening for malignant neoplasm of colon: Secondary | ICD-10-CM | POA: Diagnosis not present

## 2018-05-24 DIAGNOSIS — Z0000 Encounter for general adult medical examination without abnormal findings: Secondary | ICD-10-CM | POA: Diagnosis not present

## 2018-05-24 DIAGNOSIS — J449 Chronic obstructive pulmonary disease, unspecified: Secondary | ICD-10-CM | POA: Diagnosis not present

## 2018-05-30 DIAGNOSIS — M1711 Unilateral primary osteoarthritis, right knee: Secondary | ICD-10-CM | POA: Diagnosis not present

## 2018-05-30 DIAGNOSIS — R5383 Other fatigue: Secondary | ICD-10-CM | POA: Diagnosis not present

## 2018-05-30 DIAGNOSIS — J301 Allergic rhinitis due to pollen: Secondary | ICD-10-CM | POA: Diagnosis not present

## 2018-05-30 DIAGNOSIS — Z1211 Encounter for screening for malignant neoplasm of colon: Secondary | ICD-10-CM | POA: Diagnosis not present

## 2018-05-30 DIAGNOSIS — R918 Other nonspecific abnormal finding of lung field: Secondary | ICD-10-CM | POA: Diagnosis not present

## 2018-05-30 DIAGNOSIS — J449 Chronic obstructive pulmonary disease, unspecified: Secondary | ICD-10-CM | POA: Diagnosis not present

## 2018-05-30 DIAGNOSIS — Z1212 Encounter for screening for malignant neoplasm of rectum: Secondary | ICD-10-CM | POA: Diagnosis not present

## 2018-05-30 DIAGNOSIS — G4733 Obstructive sleep apnea (adult) (pediatric): Secondary | ICD-10-CM | POA: Diagnosis not present

## 2018-06-02 DIAGNOSIS — M1990 Unspecified osteoarthritis, unspecified site: Secondary | ICD-10-CM | POA: Diagnosis not present

## 2018-06-02 DIAGNOSIS — I10 Essential (primary) hypertension: Secondary | ICD-10-CM | POA: Diagnosis not present

## 2018-06-02 DIAGNOSIS — G4730 Sleep apnea, unspecified: Secondary | ICD-10-CM | POA: Diagnosis not present

## 2018-06-02 DIAGNOSIS — G90529 Complex regional pain syndrome I of unspecified lower limb: Secondary | ICD-10-CM | POA: Diagnosis not present

## 2018-06-05 DIAGNOSIS — G4733 Obstructive sleep apnea (adult) (pediatric): Secondary | ICD-10-CM | POA: Diagnosis not present

## 2018-06-05 DIAGNOSIS — J301 Allergic rhinitis due to pollen: Secondary | ICD-10-CM | POA: Diagnosis not present

## 2018-06-05 DIAGNOSIS — J4541 Moderate persistent asthma with (acute) exacerbation: Secondary | ICD-10-CM | POA: Diagnosis not present

## 2018-06-05 DIAGNOSIS — R918 Other nonspecific abnormal finding of lung field: Secondary | ICD-10-CM | POA: Diagnosis not present

## 2018-06-05 DIAGNOSIS — J9621 Acute and chronic respiratory failure with hypoxia: Secondary | ICD-10-CM | POA: Diagnosis not present

## 2018-06-06 DIAGNOSIS — J9621 Acute and chronic respiratory failure with hypoxia: Secondary | ICD-10-CM | POA: Diagnosis not present

## 2018-06-06 DIAGNOSIS — G4733 Obstructive sleep apnea (adult) (pediatric): Secondary | ICD-10-CM | POA: Diagnosis not present

## 2018-06-06 DIAGNOSIS — R918 Other nonspecific abnormal finding of lung field: Secondary | ICD-10-CM | POA: Diagnosis not present

## 2018-06-06 DIAGNOSIS — J4541 Moderate persistent asthma with (acute) exacerbation: Secondary | ICD-10-CM | POA: Diagnosis not present

## 2018-06-06 DIAGNOSIS — E041 Nontoxic single thyroid nodule: Secondary | ICD-10-CM | POA: Diagnosis not present

## 2018-06-07 DIAGNOSIS — J9621 Acute and chronic respiratory failure with hypoxia: Secondary | ICD-10-CM | POA: Diagnosis not present

## 2018-06-07 DIAGNOSIS — J4541 Moderate persistent asthma with (acute) exacerbation: Secondary | ICD-10-CM | POA: Diagnosis not present

## 2018-06-07 DIAGNOSIS — R918 Other nonspecific abnormal finding of lung field: Secondary | ICD-10-CM | POA: Diagnosis not present

## 2018-06-08 DIAGNOSIS — Z79899 Other long term (current) drug therapy: Secondary | ICD-10-CM | POA: Diagnosis not present

## 2018-06-08 DIAGNOSIS — R0789 Other chest pain: Secondary | ICD-10-CM | POA: Diagnosis not present

## 2018-06-08 DIAGNOSIS — Z7952 Long term (current) use of systemic steroids: Secondary | ICD-10-CM | POA: Diagnosis not present

## 2018-06-08 DIAGNOSIS — G4733 Obstructive sleep apnea (adult) (pediatric): Secondary | ICD-10-CM | POA: Diagnosis not present

## 2018-06-08 DIAGNOSIS — J439 Emphysema, unspecified: Secondary | ICD-10-CM | POA: Diagnosis not present

## 2018-06-08 DIAGNOSIS — J069 Acute upper respiratory infection, unspecified: Secondary | ICD-10-CM | POA: Diagnosis not present

## 2018-06-08 DIAGNOSIS — Z7401 Bed confinement status: Secondary | ICD-10-CM | POA: Diagnosis not present

## 2018-06-08 DIAGNOSIS — J449 Chronic obstructive pulmonary disease, unspecified: Secondary | ICD-10-CM | POA: Diagnosis not present

## 2018-06-08 DIAGNOSIS — R0602 Shortness of breath: Secondary | ICD-10-CM | POA: Diagnosis not present

## 2018-06-08 DIAGNOSIS — Z8673 Personal history of transient ischemic attack (TIA), and cerebral infarction without residual deficits: Secondary | ICD-10-CM | POA: Diagnosis not present

## 2018-06-08 DIAGNOSIS — J4541 Moderate persistent asthma with (acute) exacerbation: Secondary | ICD-10-CM | POA: Diagnosis not present

## 2018-06-08 DIAGNOSIS — J9621 Acute and chronic respiratory failure with hypoxia: Secondary | ICD-10-CM | POA: Diagnosis not present

## 2018-06-08 DIAGNOSIS — R918 Other nonspecific abnormal finding of lung field: Secondary | ICD-10-CM | POA: Diagnosis not present

## 2018-06-08 DIAGNOSIS — R079 Chest pain, unspecified: Secondary | ICD-10-CM | POA: Diagnosis not present

## 2018-06-08 DIAGNOSIS — I509 Heart failure, unspecified: Secondary | ICD-10-CM | POA: Diagnosis not present

## 2018-06-08 DIAGNOSIS — Z87891 Personal history of nicotine dependence: Secondary | ICD-10-CM | POA: Diagnosis not present

## 2018-06-09 DIAGNOSIS — J4541 Moderate persistent asthma with (acute) exacerbation: Secondary | ICD-10-CM | POA: Diagnosis not present

## 2018-06-09 DIAGNOSIS — R918 Other nonspecific abnormal finding of lung field: Secondary | ICD-10-CM | POA: Diagnosis not present

## 2018-06-09 DIAGNOSIS — R05 Cough: Secondary | ICD-10-CM | POA: Diagnosis not present

## 2018-06-09 DIAGNOSIS — R52 Pain, unspecified: Secondary | ICD-10-CM | POA: Diagnosis not present

## 2018-06-10 DIAGNOSIS — R918 Other nonspecific abnormal finding of lung field: Secondary | ICD-10-CM | POA: Diagnosis not present

## 2018-06-10 DIAGNOSIS — R05 Cough: Secondary | ICD-10-CM | POA: Diagnosis not present

## 2018-06-10 DIAGNOSIS — J4541 Moderate persistent asthma with (acute) exacerbation: Secondary | ICD-10-CM | POA: Diagnosis not present

## 2018-06-10 DIAGNOSIS — J9621 Acute and chronic respiratory failure with hypoxia: Secondary | ICD-10-CM | POA: Diagnosis not present

## 2018-06-12 DIAGNOSIS — R05 Cough: Secondary | ICD-10-CM | POA: Diagnosis not present

## 2018-06-12 DIAGNOSIS — R918 Other nonspecific abnormal finding of lung field: Secondary | ICD-10-CM | POA: Diagnosis not present

## 2018-06-12 DIAGNOSIS — G4733 Obstructive sleep apnea (adult) (pediatric): Secondary | ICD-10-CM | POA: Diagnosis not present

## 2018-06-12 DIAGNOSIS — J9621 Acute and chronic respiratory failure with hypoxia: Secondary | ICD-10-CM | POA: Diagnosis not present

## 2018-06-15 DIAGNOSIS — G4733 Obstructive sleep apnea (adult) (pediatric): Secondary | ICD-10-CM | POA: Diagnosis not present

## 2018-06-19 DIAGNOSIS — J449 Chronic obstructive pulmonary disease, unspecified: Secondary | ICD-10-CM | POA: Diagnosis not present

## 2018-06-27 DIAGNOSIS — M1711 Unilateral primary osteoarthritis, right knee: Secondary | ICD-10-CM | POA: Diagnosis not present

## 2018-06-27 DIAGNOSIS — S76111A Strain of right quadriceps muscle, fascia and tendon, initial encounter: Secondary | ICD-10-CM | POA: Diagnosis not present

## 2018-06-30 DIAGNOSIS — J449 Chronic obstructive pulmonary disease, unspecified: Secondary | ICD-10-CM | POA: Diagnosis not present

## 2018-07-03 DIAGNOSIS — E785 Hyperlipidemia, unspecified: Secondary | ICD-10-CM | POA: Diagnosis not present

## 2018-07-03 DIAGNOSIS — I10 Essential (primary) hypertension: Secondary | ICD-10-CM | POA: Diagnosis not present

## 2018-07-03 DIAGNOSIS — R739 Hyperglycemia, unspecified: Secondary | ICD-10-CM | POA: Diagnosis not present

## 2018-07-03 DIAGNOSIS — Z79899 Other long term (current) drug therapy: Secondary | ICD-10-CM | POA: Diagnosis not present

## 2018-07-05 DIAGNOSIS — M25561 Pain in right knee: Secondary | ICD-10-CM | POA: Diagnosis not present

## 2018-07-05 DIAGNOSIS — R0600 Dyspnea, unspecified: Secondary | ICD-10-CM | POA: Diagnosis not present

## 2018-07-05 DIAGNOSIS — J9611 Chronic respiratory failure with hypoxia: Secondary | ICD-10-CM | POA: Diagnosis not present

## 2018-07-05 DIAGNOSIS — G4733 Obstructive sleep apnea (adult) (pediatric): Secondary | ICD-10-CM | POA: Diagnosis not present

## 2018-07-05 DIAGNOSIS — S83241A Other tear of medial meniscus, current injury, right knee, initial encounter: Secondary | ICD-10-CM | POA: Diagnosis not present

## 2018-07-05 DIAGNOSIS — R918 Other nonspecific abnormal finding of lung field: Secondary | ICD-10-CM | POA: Diagnosis not present

## 2018-07-06 DIAGNOSIS — J9611 Chronic respiratory failure with hypoxia: Secondary | ICD-10-CM | POA: Diagnosis not present

## 2018-07-06 DIAGNOSIS — R0600 Dyspnea, unspecified: Secondary | ICD-10-CM | POA: Diagnosis not present

## 2018-07-06 DIAGNOSIS — J4541 Moderate persistent asthma with (acute) exacerbation: Secondary | ICD-10-CM | POA: Diagnosis not present

## 2018-07-07 DIAGNOSIS — R05 Cough: Secondary | ICD-10-CM | POA: Diagnosis not present

## 2018-07-12 DIAGNOSIS — M1711 Unilateral primary osteoarthritis, right knee: Secondary | ICD-10-CM | POA: Diagnosis not present

## 2018-07-13 DIAGNOSIS — G4733 Obstructive sleep apnea (adult) (pediatric): Secondary | ICD-10-CM | POA: Diagnosis not present

## 2018-07-14 DIAGNOSIS — Z89612 Acquired absence of left leg above knee: Secondary | ICD-10-CM | POA: Diagnosis not present

## 2018-07-14 DIAGNOSIS — M25561 Pain in right knee: Secondary | ICD-10-CM | POA: Diagnosis not present

## 2018-07-14 DIAGNOSIS — I69354 Hemiplegia and hemiparesis following cerebral infarction affecting left non-dominant side: Secondary | ICD-10-CM | POA: Diagnosis not present

## 2018-07-14 DIAGNOSIS — S76111D Strain of right quadriceps muscle, fascia and tendon, subsequent encounter: Secondary | ICD-10-CM | POA: Diagnosis not present

## 2018-07-14 DIAGNOSIS — J9611 Chronic respiratory failure with hypoxia: Secondary | ICD-10-CM | POA: Diagnosis not present

## 2018-07-14 DIAGNOSIS — I110 Hypertensive heart disease with heart failure: Secondary | ICD-10-CM | POA: Diagnosis not present

## 2018-07-14 DIAGNOSIS — R296 Repeated falls: Secondary | ICD-10-CM | POA: Diagnosis not present

## 2018-07-14 DIAGNOSIS — Z7951 Long term (current) use of inhaled steroids: Secondary | ICD-10-CM | POA: Diagnosis not present

## 2018-07-14 DIAGNOSIS — G4733 Obstructive sleep apnea (adult) (pediatric): Secondary | ICD-10-CM | POA: Diagnosis not present

## 2018-07-14 DIAGNOSIS — S83241D Other tear of medial meniscus, current injury, right knee, subsequent encounter: Secondary | ICD-10-CM | POA: Diagnosis not present

## 2018-07-14 DIAGNOSIS — Z87891 Personal history of nicotine dependence: Secondary | ICD-10-CM | POA: Diagnosis not present

## 2018-07-14 DIAGNOSIS — I5032 Chronic diastolic (congestive) heart failure: Secondary | ICD-10-CM | POA: Diagnosis not present

## 2018-07-14 DIAGNOSIS — Z9981 Dependence on supplemental oxygen: Secondary | ICD-10-CM | POA: Diagnosis not present

## 2018-07-14 DIAGNOSIS — J449 Chronic obstructive pulmonary disease, unspecified: Secondary | ICD-10-CM | POA: Diagnosis not present

## 2018-07-14 DIAGNOSIS — Z87440 Personal history of urinary (tract) infections: Secondary | ICD-10-CM | POA: Diagnosis not present

## 2018-07-14 DIAGNOSIS — G8929 Other chronic pain: Secondary | ICD-10-CM | POA: Diagnosis not present

## 2018-07-14 DIAGNOSIS — Z993 Dependence on wheelchair: Secondary | ICD-10-CM | POA: Diagnosis not present

## 2018-07-14 DIAGNOSIS — Z9181 History of falling: Secondary | ICD-10-CM | POA: Diagnosis not present

## 2018-07-16 DIAGNOSIS — G4733 Obstructive sleep apnea (adult) (pediatric): Secondary | ICD-10-CM | POA: Diagnosis not present

## 2018-07-17 DIAGNOSIS — M25552 Pain in left hip: Secondary | ICD-10-CM | POA: Diagnosis not present

## 2018-07-17 DIAGNOSIS — J159 Unspecified bacterial pneumonia: Secondary | ICD-10-CM | POA: Diagnosis not present

## 2018-07-17 DIAGNOSIS — J9611 Chronic respiratory failure with hypoxia: Secondary | ICD-10-CM | POA: Diagnosis not present

## 2018-07-17 DIAGNOSIS — M546 Pain in thoracic spine: Secondary | ICD-10-CM | POA: Diagnosis not present

## 2018-07-17 DIAGNOSIS — S32402A Unspecified fracture of left acetabulum, initial encounter for closed fracture: Secondary | ICD-10-CM | POA: Diagnosis not present

## 2018-07-17 DIAGNOSIS — R1011 Right upper quadrant pain: Secondary | ICD-10-CM | POA: Diagnosis not present

## 2018-07-17 DIAGNOSIS — E7800 Pure hypercholesterolemia, unspecified: Secondary | ICD-10-CM | POA: Diagnosis not present

## 2018-07-17 DIAGNOSIS — R296 Repeated falls: Secondary | ICD-10-CM | POA: Diagnosis not present

## 2018-07-17 DIAGNOSIS — I10 Essential (primary) hypertension: Secondary | ICD-10-CM | POA: Diagnosis not present

## 2018-07-17 DIAGNOSIS — D72829 Elevated white blood cell count, unspecified: Secondary | ICD-10-CM | POA: Diagnosis not present

## 2018-07-17 DIAGNOSIS — R279 Unspecified lack of coordination: Secondary | ICD-10-CM | POA: Diagnosis not present

## 2018-07-17 DIAGNOSIS — Z8673 Personal history of transient ischemic attack (TIA), and cerebral infarction without residual deficits: Secondary | ICD-10-CM | POA: Diagnosis not present

## 2018-07-17 DIAGNOSIS — I110 Hypertensive heart disease with heart failure: Secondary | ICD-10-CM | POA: Diagnosis not present

## 2018-07-17 DIAGNOSIS — R0902 Hypoxemia: Secondary | ICD-10-CM | POA: Diagnosis not present

## 2018-07-17 DIAGNOSIS — K449 Diaphragmatic hernia without obstruction or gangrene: Secondary | ICD-10-CM | POA: Diagnosis not present

## 2018-07-17 DIAGNOSIS — K5900 Constipation, unspecified: Secondary | ICD-10-CM | POA: Diagnosis not present

## 2018-07-17 DIAGNOSIS — G4733 Obstructive sleep apnea (adult) (pediatric): Secondary | ICD-10-CM | POA: Diagnosis not present

## 2018-07-17 DIAGNOSIS — Z043 Encounter for examination and observation following other accident: Secondary | ICD-10-CM | POA: Diagnosis not present

## 2018-07-17 DIAGNOSIS — Z743 Need for continuous supervision: Secondary | ICD-10-CM | POA: Diagnosis not present

## 2018-07-17 DIAGNOSIS — I639 Cerebral infarction, unspecified: Secondary | ICD-10-CM | POA: Diagnosis not present

## 2018-07-17 DIAGNOSIS — I69334 Monoplegia of upper limb following cerebral infarction affecting left non-dominant side: Secondary | ICD-10-CM | POA: Diagnosis not present

## 2018-07-17 DIAGNOSIS — R52 Pain, unspecified: Secondary | ICD-10-CM | POA: Diagnosis not present

## 2018-07-17 DIAGNOSIS — Z89612 Acquired absence of left leg above knee: Secondary | ICD-10-CM | POA: Diagnosis not present

## 2018-07-17 DIAGNOSIS — J440 Chronic obstructive pulmonary disease with acute lower respiratory infection: Secondary | ICD-10-CM | POA: Diagnosis not present

## 2018-07-17 DIAGNOSIS — R9431 Abnormal electrocardiogram [ECG] [EKG]: Secondary | ICD-10-CM | POA: Diagnosis not present

## 2018-07-17 DIAGNOSIS — D649 Anemia, unspecified: Secondary | ICD-10-CM | POA: Diagnosis not present

## 2018-07-17 DIAGNOSIS — R42 Dizziness and giddiness: Secondary | ICD-10-CM | POA: Diagnosis not present

## 2018-07-17 DIAGNOSIS — S2232XA Fracture of one rib, left side, initial encounter for closed fracture: Secondary | ICD-10-CM | POA: Diagnosis not present

## 2018-07-17 DIAGNOSIS — Z9981 Dependence on supplemental oxygen: Secondary | ICD-10-CM | POA: Diagnosis not present

## 2018-07-17 DIAGNOSIS — R1084 Generalized abdominal pain: Secondary | ICD-10-CM | POA: Diagnosis not present

## 2018-07-17 DIAGNOSIS — Z993 Dependence on wheelchair: Secondary | ICD-10-CM | POA: Diagnosis not present

## 2018-07-17 DIAGNOSIS — Z742 Need for assistance at home and no other household member able to render care: Secondary | ICD-10-CM | POA: Diagnosis not present

## 2018-07-17 DIAGNOSIS — R1012 Left upper quadrant pain: Secondary | ICD-10-CM | POA: Diagnosis not present

## 2018-07-17 DIAGNOSIS — I5032 Chronic diastolic (congestive) heart failure: Secondary | ICD-10-CM | POA: Diagnosis not present

## 2018-07-17 DIAGNOSIS — K219 Gastro-esophageal reflux disease without esophagitis: Secondary | ICD-10-CM | POA: Diagnosis not present

## 2018-07-17 DIAGNOSIS — S32422A Displaced fracture of posterior wall of left acetabulum, initial encounter for closed fracture: Secondary | ICD-10-CM | POA: Diagnosis not present

## 2018-07-17 DIAGNOSIS — S32492A Other specified fracture of left acetabulum, initial encounter for closed fracture: Secondary | ICD-10-CM | POA: Diagnosis not present

## 2018-07-17 DIAGNOSIS — G8929 Other chronic pain: Secondary | ICD-10-CM | POA: Diagnosis not present

## 2018-07-17 DIAGNOSIS — M545 Low back pain: Secondary | ICD-10-CM | POA: Diagnosis not present

## 2018-07-17 DIAGNOSIS — J449 Chronic obstructive pulmonary disease, unspecified: Secondary | ICD-10-CM | POA: Diagnosis not present

## 2018-07-17 DIAGNOSIS — J158 Pneumonia due to other specified bacteria: Secondary | ICD-10-CM | POA: Diagnosis not present

## 2018-07-17 DIAGNOSIS — W19XXXD Unspecified fall, subsequent encounter: Secondary | ICD-10-CM | POA: Diagnosis not present

## 2018-07-17 DIAGNOSIS — W1839XA Other fall on same level, initial encounter: Secondary | ICD-10-CM | POA: Diagnosis not present

## 2018-07-17 DIAGNOSIS — M542 Cervicalgia: Secondary | ICD-10-CM | POA: Diagnosis not present

## 2018-07-17 DIAGNOSIS — Y999 Unspecified external cause status: Secondary | ICD-10-CM | POA: Diagnosis not present

## 2018-07-17 DIAGNOSIS — S3289XA Fracture of other parts of pelvis, initial encounter for closed fracture: Secondary | ICD-10-CM | POA: Diagnosis not present

## 2018-07-17 DIAGNOSIS — J441 Chronic obstructive pulmonary disease with (acute) exacerbation: Secondary | ICD-10-CM | POA: Diagnosis not present

## 2018-07-17 DIAGNOSIS — E785 Hyperlipidemia, unspecified: Secondary | ICD-10-CM | POA: Diagnosis not present

## 2018-07-17 DIAGNOSIS — Z89522 Acquired absence of left knee: Secondary | ICD-10-CM | POA: Diagnosis not present

## 2018-07-17 DIAGNOSIS — S32402D Unspecified fracture of left acetabulum, subsequent encounter for fracture with routine healing: Secondary | ICD-10-CM | POA: Diagnosis not present

## 2018-07-17 DIAGNOSIS — W19XXXA Unspecified fall, initial encounter: Secondary | ICD-10-CM | POA: Diagnosis not present

## 2018-07-18 DIAGNOSIS — F329 Major depressive disorder, single episode, unspecified: Secondary | ICD-10-CM | POA: Insufficient documentation

## 2018-07-18 DIAGNOSIS — Z8673 Personal history of transient ischemic attack (TIA), and cerebral infarction without residual deficits: Secondary | ICD-10-CM

## 2018-07-18 DIAGNOSIS — F419 Anxiety disorder, unspecified: Secondary | ICD-10-CM | POA: Insufficient documentation

## 2018-07-18 DIAGNOSIS — I5032 Chronic diastolic (congestive) heart failure: Secondary | ICD-10-CM

## 2018-07-18 DIAGNOSIS — Z9981 Dependence on supplemental oxygen: Secondary | ICD-10-CM

## 2018-07-18 DIAGNOSIS — R1012 Left upper quadrant pain: Secondary | ICD-10-CM

## 2018-07-18 DIAGNOSIS — D72829 Elevated white blood cell count, unspecified: Secondary | ICD-10-CM | POA: Insufficient documentation

## 2018-07-18 DIAGNOSIS — S32402A Unspecified fracture of left acetabulum, initial encounter for closed fracture: Secondary | ICD-10-CM

## 2018-07-18 HISTORY — DX: Unspecified fracture of left acetabulum, initial encounter for closed fracture: S32.402A

## 2018-07-18 HISTORY — DX: Elevated white blood cell count, unspecified: D72.829

## 2018-07-18 HISTORY — DX: Chronic diastolic (congestive) heart failure: I50.32

## 2018-07-18 HISTORY — DX: Personal history of transient ischemic attack (TIA), and cerebral infarction without residual deficits: Z86.73

## 2018-07-18 HISTORY — DX: Major depressive disorder, single episode, unspecified: F32.9

## 2018-07-18 HISTORY — DX: Dependence on supplemental oxygen: Z99.81

## 2018-07-18 HISTORY — DX: Left upper quadrant pain: R10.12

## 2018-07-20 DIAGNOSIS — K5900 Constipation, unspecified: Secondary | ICD-10-CM

## 2018-07-20 DIAGNOSIS — J159 Unspecified bacterial pneumonia: Secondary | ICD-10-CM

## 2018-07-20 HISTORY — DX: Unspecified bacterial pneumonia: J15.9

## 2018-07-20 HISTORY — DX: Constipation, unspecified: K59.00

## 2018-07-21 DIAGNOSIS — R279 Unspecified lack of coordination: Secondary | ICD-10-CM | POA: Diagnosis not present

## 2018-07-21 DIAGNOSIS — R296 Repeated falls: Secondary | ICD-10-CM | POA: Diagnosis not present

## 2018-07-21 DIAGNOSIS — D72829 Elevated white blood cell count, unspecified: Secondary | ICD-10-CM | POA: Diagnosis not present

## 2018-07-21 DIAGNOSIS — R1012 Left upper quadrant pain: Secondary | ICD-10-CM | POA: Diagnosis not present

## 2018-07-21 DIAGNOSIS — W19XXXD Unspecified fall, subsequent encounter: Secondary | ICD-10-CM | POA: Diagnosis not present

## 2018-07-21 DIAGNOSIS — Z89522 Acquired absence of left knee: Secondary | ICD-10-CM | POA: Diagnosis not present

## 2018-07-21 DIAGNOSIS — K219 Gastro-esophageal reflux disease without esophagitis: Secondary | ICD-10-CM | POA: Diagnosis not present

## 2018-07-21 DIAGNOSIS — I5032 Chronic diastolic (congestive) heart failure: Secondary | ICD-10-CM | POA: Diagnosis not present

## 2018-07-21 DIAGNOSIS — R262 Difficulty in walking, not elsewhere classified: Secondary | ICD-10-CM | POA: Diagnosis not present

## 2018-07-21 DIAGNOSIS — Z8673 Personal history of transient ischemic attack (TIA), and cerebral infarction without residual deficits: Secondary | ICD-10-CM | POA: Diagnosis not present

## 2018-07-21 DIAGNOSIS — S32402D Unspecified fracture of left acetabulum, subsequent encounter for fracture with routine healing: Secondary | ICD-10-CM | POA: Diagnosis not present

## 2018-07-21 DIAGNOSIS — Z743 Need for continuous supervision: Secondary | ICD-10-CM | POA: Diagnosis not present

## 2018-07-21 DIAGNOSIS — J158 Pneumonia due to other specified bacteria: Secondary | ICD-10-CM | POA: Diagnosis not present

## 2018-07-21 DIAGNOSIS — Z742 Need for assistance at home and no other household member able to render care: Secondary | ICD-10-CM | POA: Diagnosis not present

## 2018-07-21 DIAGNOSIS — M25552 Pain in left hip: Secondary | ICD-10-CM | POA: Diagnosis not present

## 2018-07-21 DIAGNOSIS — R52 Pain, unspecified: Secondary | ICD-10-CM | POA: Diagnosis not present

## 2018-07-21 DIAGNOSIS — I639 Cerebral infarction, unspecified: Secondary | ICD-10-CM | POA: Diagnosis not present

## 2018-07-21 DIAGNOSIS — G4733 Obstructive sleep apnea (adult) (pediatric): Secondary | ICD-10-CM | POA: Diagnosis not present

## 2018-07-21 DIAGNOSIS — E785 Hyperlipidemia, unspecified: Secondary | ICD-10-CM | POA: Diagnosis not present

## 2018-07-21 DIAGNOSIS — K5900 Constipation, unspecified: Secondary | ICD-10-CM | POA: Diagnosis not present

## 2018-07-21 DIAGNOSIS — R269 Unspecified abnormalities of gait and mobility: Secondary | ICD-10-CM | POA: Diagnosis not present

## 2018-07-21 DIAGNOSIS — J449 Chronic obstructive pulmonary disease, unspecified: Secondary | ICD-10-CM | POA: Diagnosis not present

## 2018-07-24 DIAGNOSIS — R262 Difficulty in walking, not elsewhere classified: Secondary | ICD-10-CM | POA: Diagnosis not present

## 2018-08-29 DIAGNOSIS — S83241D Other tear of medial meniscus, current injury, right knee, subsequent encounter: Secondary | ICD-10-CM | POA: Diagnosis not present

## 2018-08-29 DIAGNOSIS — I110 Hypertensive heart disease with heart failure: Secondary | ICD-10-CM | POA: Diagnosis not present

## 2018-08-29 DIAGNOSIS — R296 Repeated falls: Secondary | ICD-10-CM | POA: Diagnosis not present

## 2018-08-29 DIAGNOSIS — I69354 Hemiplegia and hemiparesis following cerebral infarction affecting left non-dominant side: Secondary | ICD-10-CM | POA: Diagnosis not present

## 2018-08-29 DIAGNOSIS — G8929 Other chronic pain: Secondary | ICD-10-CM | POA: Diagnosis not present

## 2018-08-29 DIAGNOSIS — G4733 Obstructive sleep apnea (adult) (pediatric): Secondary | ICD-10-CM | POA: Diagnosis not present

## 2018-08-29 DIAGNOSIS — J9611 Chronic respiratory failure with hypoxia: Secondary | ICD-10-CM | POA: Diagnosis not present

## 2018-08-29 DIAGNOSIS — J449 Chronic obstructive pulmonary disease, unspecified: Secondary | ICD-10-CM | POA: Diagnosis not present

## 2018-08-29 DIAGNOSIS — Z9181 History of falling: Secondary | ICD-10-CM | POA: Diagnosis not present

## 2018-08-29 DIAGNOSIS — Z89612 Acquired absence of left leg above knee: Secondary | ICD-10-CM | POA: Diagnosis not present

## 2018-08-29 DIAGNOSIS — Z9981 Dependence on supplemental oxygen: Secondary | ICD-10-CM | POA: Diagnosis not present

## 2018-08-29 DIAGNOSIS — S76111D Strain of right quadriceps muscle, fascia and tendon, subsequent encounter: Secondary | ICD-10-CM | POA: Diagnosis not present

## 2018-08-29 DIAGNOSIS — Z87440 Personal history of urinary (tract) infections: Secondary | ICD-10-CM | POA: Diagnosis not present

## 2018-08-29 DIAGNOSIS — S32402D Unspecified fracture of left acetabulum, subsequent encounter for fracture with routine healing: Secondary | ICD-10-CM | POA: Diagnosis not present

## 2018-08-29 DIAGNOSIS — I5032 Chronic diastolic (congestive) heart failure: Secondary | ICD-10-CM | POA: Diagnosis not present

## 2018-08-29 DIAGNOSIS — Z7951 Long term (current) use of inhaled steroids: Secondary | ICD-10-CM | POA: Diagnosis not present

## 2018-08-29 DIAGNOSIS — Z87891 Personal history of nicotine dependence: Secondary | ICD-10-CM | POA: Diagnosis not present

## 2018-08-29 DIAGNOSIS — Z993 Dependence on wheelchair: Secondary | ICD-10-CM | POA: Diagnosis not present

## 2018-08-29 DIAGNOSIS — M25561 Pain in right knee: Secondary | ICD-10-CM | POA: Diagnosis not present

## 2018-08-30 DIAGNOSIS — J449 Chronic obstructive pulmonary disease, unspecified: Secondary | ICD-10-CM | POA: Diagnosis not present

## 2018-08-31 ENCOUNTER — Other Ambulatory Visit: Payer: Self-pay

## 2018-08-31 DIAGNOSIS — G4700 Insomnia, unspecified: Secondary | ICD-10-CM | POA: Diagnosis not present

## 2018-08-31 DIAGNOSIS — S83241D Other tear of medial meniscus, current injury, right knee, subsequent encounter: Secondary | ICD-10-CM | POA: Diagnosis not present

## 2018-08-31 DIAGNOSIS — I5032 Chronic diastolic (congestive) heart failure: Secondary | ICD-10-CM | POA: Diagnosis not present

## 2018-08-31 DIAGNOSIS — J9611 Chronic respiratory failure with hypoxia: Secondary | ICD-10-CM | POA: Diagnosis not present

## 2018-08-31 DIAGNOSIS — I69354 Hemiplegia and hemiparesis following cerebral infarction affecting left non-dominant side: Secondary | ICD-10-CM | POA: Diagnosis not present

## 2018-08-31 DIAGNOSIS — Z87891 Personal history of nicotine dependence: Secondary | ICD-10-CM | POA: Diagnosis not present

## 2018-08-31 DIAGNOSIS — R296 Repeated falls: Secondary | ICD-10-CM | POA: Diagnosis not present

## 2018-08-31 DIAGNOSIS — S32402A Unspecified fracture of left acetabulum, initial encounter for closed fracture: Secondary | ICD-10-CM | POA: Diagnosis not present

## 2018-08-31 DIAGNOSIS — Z9981 Dependence on supplemental oxygen: Secondary | ICD-10-CM | POA: Diagnosis not present

## 2018-08-31 DIAGNOSIS — G4733 Obstructive sleep apnea (adult) (pediatric): Secondary | ICD-10-CM | POA: Diagnosis not present

## 2018-08-31 DIAGNOSIS — S76111D Strain of right quadriceps muscle, fascia and tendon, subsequent encounter: Secondary | ICD-10-CM | POA: Diagnosis not present

## 2018-08-31 DIAGNOSIS — Z7951 Long term (current) use of inhaled steroids: Secondary | ICD-10-CM | POA: Diagnosis not present

## 2018-08-31 DIAGNOSIS — Z9181 History of falling: Secondary | ICD-10-CM | POA: Diagnosis not present

## 2018-08-31 DIAGNOSIS — Z87440 Personal history of urinary (tract) infections: Secondary | ICD-10-CM | POA: Diagnosis not present

## 2018-08-31 DIAGNOSIS — Z993 Dependence on wheelchair: Secondary | ICD-10-CM | POA: Diagnosis not present

## 2018-08-31 DIAGNOSIS — I110 Hypertensive heart disease with heart failure: Secondary | ICD-10-CM | POA: Diagnosis not present

## 2018-08-31 DIAGNOSIS — J449 Chronic obstructive pulmonary disease, unspecified: Secondary | ICD-10-CM | POA: Diagnosis not present

## 2018-08-31 DIAGNOSIS — Z89612 Acquired absence of left leg above knee: Secondary | ICD-10-CM | POA: Diagnosis not present

## 2018-08-31 DIAGNOSIS — N3281 Overactive bladder: Secondary | ICD-10-CM | POA: Diagnosis not present

## 2018-08-31 DIAGNOSIS — J209 Acute bronchitis, unspecified: Secondary | ICD-10-CM | POA: Diagnosis not present

## 2018-08-31 DIAGNOSIS — S32402D Unspecified fracture of left acetabulum, subsequent encounter for fracture with routine healing: Secondary | ICD-10-CM | POA: Diagnosis not present

## 2018-08-31 DIAGNOSIS — G8929 Other chronic pain: Secondary | ICD-10-CM | POA: Diagnosis not present

## 2018-08-31 NOTE — Patient Outreach (Addendum)
[facility] Care Management  08/31/2018  Amanda Lewis [birth year] [id number]    EMMI-GENERAL DISCHARGE  RED ON EMMI ALERT Day # 1 Date: 08/29/18  10:14 AM  Red Alert Reason: "Unfilled prescriptions? Yes", "Other questions/problems? Yes"   Outreach attempt # 1, successful.    Spoke with patient, verified HIPAA.   Reviewed and addressed red alerts. Patient states her responses were not correct. She states she has all of her medications in the home and is taking them as prescribed, denies financial hardship at this time with paying for meds.   Other: Patient has [facility] in [facility]. She will f/u with her PCP today. Patient will have an in home PT evaluation today, therapy will start after her MRI indicates complete healing of the hip fracture. She lives with her son, he also provides transportation as needed. Patient denies having any questions or concerns at this time and states appreciation for the call. RN CM offered [facility] 24/7 nurse advice line phone number, patient is unable to take down the phone number. Patient states she knows who to call if help is needed and or if a change in her condition occurs.   Plan: RN CM will close case due to patient is stable and no CM needs are identified at this time.    Delsa SaleAngel Argusta Mcgann, RN,CCM [facility] Care Management Care Management Coordinator Direct Phone: [phone] Toll Free: [phone] Fax: [phone]

## 2018-09-01 DIAGNOSIS — J4541 Moderate persistent asthma with (acute) exacerbation: Secondary | ICD-10-CM | POA: Diagnosis not present

## 2018-09-05 DIAGNOSIS — Z9181 History of falling: Secondary | ICD-10-CM | POA: Diagnosis not present

## 2018-09-05 DIAGNOSIS — I5032 Chronic diastolic (congestive) heart failure: Secondary | ICD-10-CM | POA: Diagnosis not present

## 2018-09-05 DIAGNOSIS — I110 Hypertensive heart disease with heart failure: Secondary | ICD-10-CM | POA: Diagnosis not present

## 2018-09-05 DIAGNOSIS — J9611 Chronic respiratory failure with hypoxia: Secondary | ICD-10-CM | POA: Diagnosis not present

## 2018-09-05 DIAGNOSIS — J449 Chronic obstructive pulmonary disease, unspecified: Secondary | ICD-10-CM | POA: Diagnosis not present

## 2018-09-05 DIAGNOSIS — Z7951 Long term (current) use of inhaled steroids: Secondary | ICD-10-CM | POA: Diagnosis not present

## 2018-09-05 DIAGNOSIS — S76111D Strain of right quadriceps muscle, fascia and tendon, subsequent encounter: Secondary | ICD-10-CM | POA: Diagnosis not present

## 2018-09-05 DIAGNOSIS — G8929 Other chronic pain: Secondary | ICD-10-CM | POA: Diagnosis not present

## 2018-09-05 DIAGNOSIS — I69354 Hemiplegia and hemiparesis following cerebral infarction affecting left non-dominant side: Secondary | ICD-10-CM | POA: Diagnosis not present

## 2018-09-05 DIAGNOSIS — Z89612 Acquired absence of left leg above knee: Secondary | ICD-10-CM | POA: Diagnosis not present

## 2018-09-05 DIAGNOSIS — J209 Acute bronchitis, unspecified: Secondary | ICD-10-CM | POA: Diagnosis not present

## 2018-09-05 DIAGNOSIS — G4733 Obstructive sleep apnea (adult) (pediatric): Secondary | ICD-10-CM | POA: Diagnosis not present

## 2018-09-05 DIAGNOSIS — R296 Repeated falls: Secondary | ICD-10-CM | POA: Diagnosis not present

## 2018-09-05 DIAGNOSIS — S32402D Unspecified fracture of left acetabulum, subsequent encounter for fracture with routine healing: Secondary | ICD-10-CM | POA: Diagnosis not present

## 2018-09-05 DIAGNOSIS — Z9981 Dependence on supplemental oxygen: Secondary | ICD-10-CM | POA: Diagnosis not present

## 2018-09-05 DIAGNOSIS — S83241D Other tear of medial meniscus, current injury, right knee, subsequent encounter: Secondary | ICD-10-CM | POA: Diagnosis not present

## 2018-09-05 DIAGNOSIS — Z87891 Personal history of nicotine dependence: Secondary | ICD-10-CM | POA: Diagnosis not present

## 2018-09-05 DIAGNOSIS — Z993 Dependence on wheelchair: Secondary | ICD-10-CM | POA: Diagnosis not present

## 2018-09-05 DIAGNOSIS — Z87440 Personal history of urinary (tract) infections: Secondary | ICD-10-CM | POA: Diagnosis not present

## 2018-09-06 DIAGNOSIS — J441 Chronic obstructive pulmonary disease with (acute) exacerbation: Secondary | ICD-10-CM | POA: Diagnosis not present

## 2018-09-06 DIAGNOSIS — R0600 Dyspnea, unspecified: Secondary | ICD-10-CM | POA: Diagnosis not present

## 2018-09-06 DIAGNOSIS — J918 Pleural effusion in other conditions classified elsewhere: Secondary | ICD-10-CM | POA: Diagnosis not present

## 2018-09-06 DIAGNOSIS — J189 Pneumonia, unspecified organism: Secondary | ICD-10-CM | POA: Diagnosis not present

## 2018-09-06 DIAGNOSIS — R05 Cough: Secondary | ICD-10-CM | POA: Diagnosis not present

## 2018-09-07 DIAGNOSIS — G8929 Other chronic pain: Secondary | ICD-10-CM | POA: Diagnosis not present

## 2018-09-07 DIAGNOSIS — I69354 Hemiplegia and hemiparesis following cerebral infarction affecting left non-dominant side: Secondary | ICD-10-CM | POA: Diagnosis not present

## 2018-09-07 DIAGNOSIS — S83241D Other tear of medial meniscus, current injury, right knee, subsequent encounter: Secondary | ICD-10-CM | POA: Diagnosis not present

## 2018-09-07 DIAGNOSIS — J209 Acute bronchitis, unspecified: Secondary | ICD-10-CM | POA: Diagnosis not present

## 2018-09-07 DIAGNOSIS — Z87891 Personal history of nicotine dependence: Secondary | ICD-10-CM | POA: Diagnosis not present

## 2018-09-07 DIAGNOSIS — Z87440 Personal history of urinary (tract) infections: Secondary | ICD-10-CM | POA: Diagnosis not present

## 2018-09-07 DIAGNOSIS — S76111D Strain of right quadriceps muscle, fascia and tendon, subsequent encounter: Secondary | ICD-10-CM | POA: Diagnosis not present

## 2018-09-07 DIAGNOSIS — S32402D Unspecified fracture of left acetabulum, subsequent encounter for fracture with routine healing: Secondary | ICD-10-CM | POA: Diagnosis not present

## 2018-09-07 DIAGNOSIS — J449 Chronic obstructive pulmonary disease, unspecified: Secondary | ICD-10-CM | POA: Diagnosis not present

## 2018-09-07 DIAGNOSIS — Z9181 History of falling: Secondary | ICD-10-CM | POA: Diagnosis not present

## 2018-09-07 DIAGNOSIS — I110 Hypertensive heart disease with heart failure: Secondary | ICD-10-CM | POA: Diagnosis not present

## 2018-09-07 DIAGNOSIS — Z7951 Long term (current) use of inhaled steroids: Secondary | ICD-10-CM | POA: Diagnosis not present

## 2018-09-07 DIAGNOSIS — I5032 Chronic diastolic (congestive) heart failure: Secondary | ICD-10-CM | POA: Diagnosis not present

## 2018-09-07 DIAGNOSIS — R296 Repeated falls: Secondary | ICD-10-CM | POA: Diagnosis not present

## 2018-09-07 DIAGNOSIS — Z993 Dependence on wheelchair: Secondary | ICD-10-CM | POA: Diagnosis not present

## 2018-09-07 DIAGNOSIS — J9611 Chronic respiratory failure with hypoxia: Secondary | ICD-10-CM | POA: Diagnosis not present

## 2018-09-07 DIAGNOSIS — Z9981 Dependence on supplemental oxygen: Secondary | ICD-10-CM | POA: Diagnosis not present

## 2018-09-07 DIAGNOSIS — Z89612 Acquired absence of left leg above knee: Secondary | ICD-10-CM | POA: Diagnosis not present

## 2018-09-07 DIAGNOSIS — G4733 Obstructive sleep apnea (adult) (pediatric): Secondary | ICD-10-CM | POA: Diagnosis not present

## 2018-09-08 DIAGNOSIS — R918 Other nonspecific abnormal finding of lung field: Secondary | ICD-10-CM | POA: Diagnosis not present

## 2018-09-08 DIAGNOSIS — S2232XD Fracture of one rib, left side, subsequent encounter for fracture with routine healing: Secondary | ICD-10-CM | POA: Diagnosis not present

## 2018-09-12 DIAGNOSIS — S32402A Unspecified fracture of left acetabulum, initial encounter for closed fracture: Secondary | ICD-10-CM | POA: Diagnosis not present

## 2018-09-15 DIAGNOSIS — Z9981 Dependence on supplemental oxygen: Secondary | ICD-10-CM | POA: Diagnosis not present

## 2018-09-15 DIAGNOSIS — G8929 Other chronic pain: Secondary | ICD-10-CM | POA: Diagnosis not present

## 2018-09-15 DIAGNOSIS — J209 Acute bronchitis, unspecified: Secondary | ICD-10-CM | POA: Diagnosis not present

## 2018-09-15 DIAGNOSIS — Z87440 Personal history of urinary (tract) infections: Secondary | ICD-10-CM | POA: Diagnosis not present

## 2018-09-15 DIAGNOSIS — Z89612 Acquired absence of left leg above knee: Secondary | ICD-10-CM | POA: Diagnosis not present

## 2018-09-15 DIAGNOSIS — S76111D Strain of right quadriceps muscle, fascia and tendon, subsequent encounter: Secondary | ICD-10-CM | POA: Diagnosis not present

## 2018-09-15 DIAGNOSIS — S32402D Unspecified fracture of left acetabulum, subsequent encounter for fracture with routine healing: Secondary | ICD-10-CM | POA: Diagnosis not present

## 2018-09-15 DIAGNOSIS — J449 Chronic obstructive pulmonary disease, unspecified: Secondary | ICD-10-CM | POA: Diagnosis not present

## 2018-09-15 DIAGNOSIS — R296 Repeated falls: Secondary | ICD-10-CM | POA: Diagnosis not present

## 2018-09-15 DIAGNOSIS — I110 Hypertensive heart disease with heart failure: Secondary | ICD-10-CM | POA: Diagnosis not present

## 2018-09-15 DIAGNOSIS — S83241D Other tear of medial meniscus, current injury, right knee, subsequent encounter: Secondary | ICD-10-CM | POA: Diagnosis not present

## 2018-09-15 DIAGNOSIS — Z9181 History of falling: Secondary | ICD-10-CM | POA: Diagnosis not present

## 2018-09-15 DIAGNOSIS — J9611 Chronic respiratory failure with hypoxia: Secondary | ICD-10-CM | POA: Diagnosis not present

## 2018-09-15 DIAGNOSIS — I5032 Chronic diastolic (congestive) heart failure: Secondary | ICD-10-CM | POA: Diagnosis not present

## 2018-09-15 DIAGNOSIS — G4733 Obstructive sleep apnea (adult) (pediatric): Secondary | ICD-10-CM | POA: Diagnosis not present

## 2018-09-15 DIAGNOSIS — Z993 Dependence on wheelchair: Secondary | ICD-10-CM | POA: Diagnosis not present

## 2018-09-15 DIAGNOSIS — I69354 Hemiplegia and hemiparesis following cerebral infarction affecting left non-dominant side: Secondary | ICD-10-CM | POA: Diagnosis not present

## 2018-09-15 DIAGNOSIS — Z87891 Personal history of nicotine dependence: Secondary | ICD-10-CM | POA: Diagnosis not present

## 2018-09-15 DIAGNOSIS — Z7951 Long term (current) use of inhaled steroids: Secondary | ICD-10-CM | POA: Diagnosis not present

## 2018-09-18 DIAGNOSIS — J449 Chronic obstructive pulmonary disease, unspecified: Secondary | ICD-10-CM | POA: Diagnosis not present

## 2018-09-18 DIAGNOSIS — R5383 Other fatigue: Secondary | ICD-10-CM | POA: Diagnosis not present

## 2018-09-18 DIAGNOSIS — G4733 Obstructive sleep apnea (adult) (pediatric): Secondary | ICD-10-CM | POA: Diagnosis not present

## 2018-09-18 DIAGNOSIS — J301 Allergic rhinitis due to pollen: Secondary | ICD-10-CM | POA: Diagnosis not present

## 2018-09-18 DIAGNOSIS — R918 Other nonspecific abnormal finding of lung field: Secondary | ICD-10-CM | POA: Diagnosis not present

## 2018-09-19 DIAGNOSIS — M1711 Unilateral primary osteoarthritis, right knee: Secondary | ICD-10-CM | POA: Diagnosis not present

## 2018-09-20 DIAGNOSIS — G8929 Other chronic pain: Secondary | ICD-10-CM | POA: Diagnosis not present

## 2018-09-20 DIAGNOSIS — Z9981 Dependence on supplemental oxygen: Secondary | ICD-10-CM | POA: Diagnosis not present

## 2018-09-20 DIAGNOSIS — Z993 Dependence on wheelchair: Secondary | ICD-10-CM | POA: Diagnosis not present

## 2018-09-20 DIAGNOSIS — I5032 Chronic diastolic (congestive) heart failure: Secondary | ICD-10-CM | POA: Diagnosis not present

## 2018-09-20 DIAGNOSIS — Z87891 Personal history of nicotine dependence: Secondary | ICD-10-CM | POA: Diagnosis not present

## 2018-09-20 DIAGNOSIS — I69354 Hemiplegia and hemiparesis following cerebral infarction affecting left non-dominant side: Secondary | ICD-10-CM | POA: Diagnosis not present

## 2018-09-20 DIAGNOSIS — Z9181 History of falling: Secondary | ICD-10-CM | POA: Diagnosis not present

## 2018-09-20 DIAGNOSIS — G4733 Obstructive sleep apnea (adult) (pediatric): Secondary | ICD-10-CM | POA: Diagnosis not present

## 2018-09-20 DIAGNOSIS — R296 Repeated falls: Secondary | ICD-10-CM | POA: Diagnosis not present

## 2018-09-20 DIAGNOSIS — S76111D Strain of right quadriceps muscle, fascia and tendon, subsequent encounter: Secondary | ICD-10-CM | POA: Diagnosis not present

## 2018-09-20 DIAGNOSIS — S32402D Unspecified fracture of left acetabulum, subsequent encounter for fracture with routine healing: Secondary | ICD-10-CM | POA: Diagnosis not present

## 2018-09-20 DIAGNOSIS — J9611 Chronic respiratory failure with hypoxia: Secondary | ICD-10-CM | POA: Diagnosis not present

## 2018-09-20 DIAGNOSIS — Z7951 Long term (current) use of inhaled steroids: Secondary | ICD-10-CM | POA: Diagnosis not present

## 2018-09-20 DIAGNOSIS — J449 Chronic obstructive pulmonary disease, unspecified: Secondary | ICD-10-CM | POA: Diagnosis not present

## 2018-09-20 DIAGNOSIS — S83241D Other tear of medial meniscus, current injury, right knee, subsequent encounter: Secondary | ICD-10-CM | POA: Diagnosis not present

## 2018-09-20 DIAGNOSIS — Z89612 Acquired absence of left leg above knee: Secondary | ICD-10-CM | POA: Diagnosis not present

## 2018-09-20 DIAGNOSIS — J209 Acute bronchitis, unspecified: Secondary | ICD-10-CM | POA: Diagnosis not present

## 2018-09-20 DIAGNOSIS — I110 Hypertensive heart disease with heart failure: Secondary | ICD-10-CM | POA: Diagnosis not present

## 2018-09-20 DIAGNOSIS — Z87440 Personal history of urinary (tract) infections: Secondary | ICD-10-CM | POA: Diagnosis not present

## 2018-09-26 DIAGNOSIS — I69354 Hemiplegia and hemiparesis following cerebral infarction affecting left non-dominant side: Secondary | ICD-10-CM | POA: Diagnosis not present

## 2018-09-26 DIAGNOSIS — G4733 Obstructive sleep apnea (adult) (pediatric): Secondary | ICD-10-CM | POA: Diagnosis not present

## 2018-09-26 DIAGNOSIS — J449 Chronic obstructive pulmonary disease, unspecified: Secondary | ICD-10-CM | POA: Diagnosis not present

## 2018-09-26 DIAGNOSIS — J209 Acute bronchitis, unspecified: Secondary | ICD-10-CM | POA: Diagnosis not present

## 2018-09-26 DIAGNOSIS — Z87891 Personal history of nicotine dependence: Secondary | ICD-10-CM | POA: Diagnosis not present

## 2018-09-26 DIAGNOSIS — R296 Repeated falls: Secondary | ICD-10-CM | POA: Diagnosis not present

## 2018-09-26 DIAGNOSIS — G8929 Other chronic pain: Secondary | ICD-10-CM | POA: Diagnosis not present

## 2018-09-26 DIAGNOSIS — Z87440 Personal history of urinary (tract) infections: Secondary | ICD-10-CM | POA: Diagnosis not present

## 2018-09-26 DIAGNOSIS — I5032 Chronic diastolic (congestive) heart failure: Secondary | ICD-10-CM | POA: Diagnosis not present

## 2018-09-26 DIAGNOSIS — S32402D Unspecified fracture of left acetabulum, subsequent encounter for fracture with routine healing: Secondary | ICD-10-CM | POA: Diagnosis not present

## 2018-09-26 DIAGNOSIS — Z993 Dependence on wheelchair: Secondary | ICD-10-CM | POA: Diagnosis not present

## 2018-09-26 DIAGNOSIS — J9611 Chronic respiratory failure with hypoxia: Secondary | ICD-10-CM | POA: Diagnosis not present

## 2018-09-26 DIAGNOSIS — Z7951 Long term (current) use of inhaled steroids: Secondary | ICD-10-CM | POA: Diagnosis not present

## 2018-09-26 DIAGNOSIS — Z9181 History of falling: Secondary | ICD-10-CM | POA: Diagnosis not present

## 2018-09-26 DIAGNOSIS — S83241D Other tear of medial meniscus, current injury, right knee, subsequent encounter: Secondary | ICD-10-CM | POA: Diagnosis not present

## 2018-09-26 DIAGNOSIS — Z9981 Dependence on supplemental oxygen: Secondary | ICD-10-CM | POA: Diagnosis not present

## 2018-09-26 DIAGNOSIS — I110 Hypertensive heart disease with heart failure: Secondary | ICD-10-CM | POA: Diagnosis not present

## 2018-09-26 DIAGNOSIS — Z89612 Acquired absence of left leg above knee: Secondary | ICD-10-CM | POA: Diagnosis not present

## 2018-09-26 DIAGNOSIS — S76111D Strain of right quadriceps muscle, fascia and tendon, subsequent encounter: Secondary | ICD-10-CM | POA: Diagnosis not present

## 2018-09-30 DIAGNOSIS — J449 Chronic obstructive pulmonary disease, unspecified: Secondary | ICD-10-CM | POA: Diagnosis not present

## 2018-10-02 DIAGNOSIS — J4541 Moderate persistent asthma with (acute) exacerbation: Secondary | ICD-10-CM | POA: Diagnosis not present

## 2018-10-03 DIAGNOSIS — I10 Essential (primary) hypertension: Secondary | ICD-10-CM | POA: Diagnosis not present

## 2018-10-03 DIAGNOSIS — Z79899 Other long term (current) drug therapy: Secondary | ICD-10-CM | POA: Diagnosis not present

## 2018-10-03 DIAGNOSIS — E785 Hyperlipidemia, unspecified: Secondary | ICD-10-CM | POA: Diagnosis not present

## 2018-10-03 DIAGNOSIS — N3281 Overactive bladder: Secondary | ICD-10-CM | POA: Diagnosis not present

## 2018-10-03 DIAGNOSIS — R739 Hyperglycemia, unspecified: Secondary | ICD-10-CM | POA: Diagnosis not present

## 2018-10-04 DIAGNOSIS — Z993 Dependence on wheelchair: Secondary | ICD-10-CM | POA: Diagnosis not present

## 2018-10-04 DIAGNOSIS — Z89612 Acquired absence of left leg above knee: Secondary | ICD-10-CM | POA: Diagnosis not present

## 2018-10-04 DIAGNOSIS — J209 Acute bronchitis, unspecified: Secondary | ICD-10-CM | POA: Diagnosis not present

## 2018-10-04 DIAGNOSIS — J449 Chronic obstructive pulmonary disease, unspecified: Secondary | ICD-10-CM | POA: Diagnosis not present

## 2018-10-04 DIAGNOSIS — Z9181 History of falling: Secondary | ICD-10-CM | POA: Diagnosis not present

## 2018-10-04 DIAGNOSIS — R296 Repeated falls: Secondary | ICD-10-CM | POA: Diagnosis not present

## 2018-10-04 DIAGNOSIS — Z7951 Long term (current) use of inhaled steroids: Secondary | ICD-10-CM | POA: Diagnosis not present

## 2018-10-04 DIAGNOSIS — Z87440 Personal history of urinary (tract) infections: Secondary | ICD-10-CM | POA: Diagnosis not present

## 2018-10-04 DIAGNOSIS — S83241D Other tear of medial meniscus, current injury, right knee, subsequent encounter: Secondary | ICD-10-CM | POA: Diagnosis not present

## 2018-10-04 DIAGNOSIS — Z9981 Dependence on supplemental oxygen: Secondary | ICD-10-CM | POA: Diagnosis not present

## 2018-10-04 DIAGNOSIS — I110 Hypertensive heart disease with heart failure: Secondary | ICD-10-CM | POA: Diagnosis not present

## 2018-10-04 DIAGNOSIS — G8929 Other chronic pain: Secondary | ICD-10-CM | POA: Diagnosis not present

## 2018-10-04 DIAGNOSIS — S76111D Strain of right quadriceps muscle, fascia and tendon, subsequent encounter: Secondary | ICD-10-CM | POA: Diagnosis not present

## 2018-10-04 DIAGNOSIS — Z87891 Personal history of nicotine dependence: Secondary | ICD-10-CM | POA: Diagnosis not present

## 2018-10-04 DIAGNOSIS — G4733 Obstructive sleep apnea (adult) (pediatric): Secondary | ICD-10-CM | POA: Diagnosis not present

## 2018-10-04 DIAGNOSIS — J9611 Chronic respiratory failure with hypoxia: Secondary | ICD-10-CM | POA: Diagnosis not present

## 2018-10-04 DIAGNOSIS — I5032 Chronic diastolic (congestive) heart failure: Secondary | ICD-10-CM | POA: Diagnosis not present

## 2018-10-04 DIAGNOSIS — I69354 Hemiplegia and hemiparesis following cerebral infarction affecting left non-dominant side: Secondary | ICD-10-CM | POA: Diagnosis not present

## 2018-10-04 DIAGNOSIS — S32402D Unspecified fracture of left acetabulum, subsequent encounter for fracture with routine healing: Secondary | ICD-10-CM | POA: Diagnosis not present

## 2018-10-12 DIAGNOSIS — R296 Repeated falls: Secondary | ICD-10-CM | POA: Diagnosis not present

## 2018-10-12 DIAGNOSIS — Z9981 Dependence on supplemental oxygen: Secondary | ICD-10-CM | POA: Diagnosis not present

## 2018-10-12 DIAGNOSIS — I69354 Hemiplegia and hemiparesis following cerebral infarction affecting left non-dominant side: Secondary | ICD-10-CM | POA: Diagnosis not present

## 2018-10-12 DIAGNOSIS — S83241D Other tear of medial meniscus, current injury, right knee, subsequent encounter: Secondary | ICD-10-CM | POA: Diagnosis not present

## 2018-10-12 DIAGNOSIS — Z87440 Personal history of urinary (tract) infections: Secondary | ICD-10-CM | POA: Diagnosis not present

## 2018-10-12 DIAGNOSIS — J449 Chronic obstructive pulmonary disease, unspecified: Secondary | ICD-10-CM | POA: Diagnosis not present

## 2018-10-12 DIAGNOSIS — I110 Hypertensive heart disease with heart failure: Secondary | ICD-10-CM | POA: Diagnosis not present

## 2018-10-12 DIAGNOSIS — Z9181 History of falling: Secondary | ICD-10-CM | POA: Diagnosis not present

## 2018-10-12 DIAGNOSIS — Z89612 Acquired absence of left leg above knee: Secondary | ICD-10-CM | POA: Diagnosis not present

## 2018-10-12 DIAGNOSIS — G4733 Obstructive sleep apnea (adult) (pediatric): Secondary | ICD-10-CM | POA: Diagnosis not present

## 2018-10-12 DIAGNOSIS — S76111D Strain of right quadriceps muscle, fascia and tendon, subsequent encounter: Secondary | ICD-10-CM | POA: Diagnosis not present

## 2018-10-12 DIAGNOSIS — J9611 Chronic respiratory failure with hypoxia: Secondary | ICD-10-CM | POA: Diagnosis not present

## 2018-10-12 DIAGNOSIS — Z87891 Personal history of nicotine dependence: Secondary | ICD-10-CM | POA: Diagnosis not present

## 2018-10-12 DIAGNOSIS — J209 Acute bronchitis, unspecified: Secondary | ICD-10-CM | POA: Diagnosis not present

## 2018-10-12 DIAGNOSIS — G8929 Other chronic pain: Secondary | ICD-10-CM | POA: Diagnosis not present

## 2018-10-12 DIAGNOSIS — I5032 Chronic diastolic (congestive) heart failure: Secondary | ICD-10-CM | POA: Diagnosis not present

## 2018-10-12 DIAGNOSIS — Z993 Dependence on wheelchair: Secondary | ICD-10-CM | POA: Diagnosis not present

## 2018-10-12 DIAGNOSIS — Z7951 Long term (current) use of inhaled steroids: Secondary | ICD-10-CM | POA: Diagnosis not present

## 2018-10-12 DIAGNOSIS — S32402D Unspecified fracture of left acetabulum, subsequent encounter for fracture with routine healing: Secondary | ICD-10-CM | POA: Diagnosis not present

## 2018-10-16 DIAGNOSIS — G4733 Obstructive sleep apnea (adult) (pediatric): Secondary | ICD-10-CM | POA: Diagnosis not present

## 2018-10-18 DIAGNOSIS — S32402A Unspecified fracture of left acetabulum, initial encounter for closed fracture: Secondary | ICD-10-CM | POA: Diagnosis not present

## 2018-10-23 DIAGNOSIS — J301 Allergic rhinitis due to pollen: Secondary | ICD-10-CM | POA: Diagnosis not present

## 2018-10-23 DIAGNOSIS — R5383 Other fatigue: Secondary | ICD-10-CM | POA: Diagnosis not present

## 2018-10-23 DIAGNOSIS — G4733 Obstructive sleep apnea (adult) (pediatric): Secondary | ICD-10-CM | POA: Diagnosis not present

## 2018-10-23 DIAGNOSIS — J449 Chronic obstructive pulmonary disease, unspecified: Secondary | ICD-10-CM | POA: Diagnosis not present

## 2018-10-23 DIAGNOSIS — R911 Solitary pulmonary nodule: Secondary | ICD-10-CM | POA: Diagnosis not present

## 2018-10-24 DIAGNOSIS — Z7951 Long term (current) use of inhaled steroids: Secondary | ICD-10-CM | POA: Diagnosis not present

## 2018-10-24 DIAGNOSIS — Z993 Dependence on wheelchair: Secondary | ICD-10-CM | POA: Diagnosis not present

## 2018-10-24 DIAGNOSIS — Z9981 Dependence on supplemental oxygen: Secondary | ICD-10-CM | POA: Diagnosis not present

## 2018-10-24 DIAGNOSIS — S83241D Other tear of medial meniscus, current injury, right knee, subsequent encounter: Secondary | ICD-10-CM | POA: Diagnosis not present

## 2018-10-24 DIAGNOSIS — Z9181 History of falling: Secondary | ICD-10-CM | POA: Diagnosis not present

## 2018-10-24 DIAGNOSIS — S32402D Unspecified fracture of left acetabulum, subsequent encounter for fracture with routine healing: Secondary | ICD-10-CM | POA: Diagnosis not present

## 2018-10-24 DIAGNOSIS — G8929 Other chronic pain: Secondary | ICD-10-CM | POA: Diagnosis not present

## 2018-10-24 DIAGNOSIS — Z87891 Personal history of nicotine dependence: Secondary | ICD-10-CM | POA: Diagnosis not present

## 2018-10-24 DIAGNOSIS — J449 Chronic obstructive pulmonary disease, unspecified: Secondary | ICD-10-CM | POA: Diagnosis not present

## 2018-10-24 DIAGNOSIS — I110 Hypertensive heart disease with heart failure: Secondary | ICD-10-CM | POA: Diagnosis not present

## 2018-10-24 DIAGNOSIS — Z89612 Acquired absence of left leg above knee: Secondary | ICD-10-CM | POA: Diagnosis not present

## 2018-10-24 DIAGNOSIS — I5032 Chronic diastolic (congestive) heart failure: Secondary | ICD-10-CM | POA: Diagnosis not present

## 2018-10-24 DIAGNOSIS — G4733 Obstructive sleep apnea (adult) (pediatric): Secondary | ICD-10-CM | POA: Diagnosis not present

## 2018-10-24 DIAGNOSIS — S76111D Strain of right quadriceps muscle, fascia and tendon, subsequent encounter: Secondary | ICD-10-CM | POA: Diagnosis not present

## 2018-10-24 DIAGNOSIS — J9611 Chronic respiratory failure with hypoxia: Secondary | ICD-10-CM | POA: Diagnosis not present

## 2018-10-24 DIAGNOSIS — J209 Acute bronchitis, unspecified: Secondary | ICD-10-CM | POA: Diagnosis not present

## 2018-10-24 DIAGNOSIS — Z87440 Personal history of urinary (tract) infections: Secondary | ICD-10-CM | POA: Diagnosis not present

## 2018-10-24 DIAGNOSIS — R296 Repeated falls: Secondary | ICD-10-CM | POA: Diagnosis not present

## 2018-10-24 DIAGNOSIS — I69354 Hemiplegia and hemiparesis following cerebral infarction affecting left non-dominant side: Secondary | ICD-10-CM | POA: Diagnosis not present

## 2018-10-29 DIAGNOSIS — J449 Chronic obstructive pulmonary disease, unspecified: Secondary | ICD-10-CM | POA: Diagnosis not present

## 2018-10-31 DIAGNOSIS — J4541 Moderate persistent asthma with (acute) exacerbation: Secondary | ICD-10-CM | POA: Diagnosis not present

## 2018-11-01 DIAGNOSIS — E785 Hyperlipidemia, unspecified: Secondary | ICD-10-CM | POA: Diagnosis not present

## 2018-11-01 DIAGNOSIS — R739 Hyperglycemia, unspecified: Secondary | ICD-10-CM | POA: Diagnosis not present

## 2018-11-01 DIAGNOSIS — H9201 Otalgia, right ear: Secondary | ICD-10-CM | POA: Diagnosis not present

## 2018-11-02 DIAGNOSIS — M1711 Unilateral primary osteoarthritis, right knee: Secondary | ICD-10-CM | POA: Diagnosis not present

## 2018-11-14 DIAGNOSIS — G4733 Obstructive sleep apnea (adult) (pediatric): Secondary | ICD-10-CM | POA: Diagnosis not present

## 2018-11-29 DIAGNOSIS — J449 Chronic obstructive pulmonary disease, unspecified: Secondary | ICD-10-CM | POA: Diagnosis not present

## 2018-12-01 DIAGNOSIS — J4541 Moderate persistent asthma with (acute) exacerbation: Secondary | ICD-10-CM | POA: Diagnosis not present

## 2018-12-04 DIAGNOSIS — M810 Age-related osteoporosis without current pathological fracture: Secondary | ICD-10-CM | POA: Diagnosis not present

## 2018-12-04 DIAGNOSIS — J449 Chronic obstructive pulmonary disease, unspecified: Secondary | ICD-10-CM | POA: Diagnosis not present

## 2018-12-04 DIAGNOSIS — E785 Hyperlipidemia, unspecified: Secondary | ICD-10-CM | POA: Diagnosis not present

## 2018-12-06 DIAGNOSIS — G90529 Complex regional pain syndrome I of unspecified lower limb: Secondary | ICD-10-CM | POA: Diagnosis not present

## 2018-12-06 DIAGNOSIS — J4541 Moderate persistent asthma with (acute) exacerbation: Secondary | ICD-10-CM | POA: Diagnosis not present

## 2018-12-06 DIAGNOSIS — G8194 Hemiplegia, unspecified affecting left nondominant side: Secondary | ICD-10-CM | POA: Diagnosis not present

## 2018-12-15 DIAGNOSIS — G4733 Obstructive sleep apnea (adult) (pediatric): Secondary | ICD-10-CM | POA: Diagnosis not present

## 2018-12-29 DIAGNOSIS — J449 Chronic obstructive pulmonary disease, unspecified: Secondary | ICD-10-CM | POA: Diagnosis not present

## 2018-12-31 DIAGNOSIS — J4541 Moderate persistent asthma with (acute) exacerbation: Secondary | ICD-10-CM | POA: Diagnosis not present

## 2019-01-03 DIAGNOSIS — J449 Chronic obstructive pulmonary disease, unspecified: Secondary | ICD-10-CM | POA: Diagnosis not present

## 2019-01-03 DIAGNOSIS — E041 Nontoxic single thyroid nodule: Secondary | ICD-10-CM | POA: Diagnosis not present

## 2019-01-03 DIAGNOSIS — J029 Acute pharyngitis, unspecified: Secondary | ICD-10-CM | POA: Diagnosis not present

## 2019-01-25 DIAGNOSIS — G4733 Obstructive sleep apnea (adult) (pediatric): Secondary | ICD-10-CM | POA: Diagnosis not present

## 2019-01-29 DIAGNOSIS — J449 Chronic obstructive pulmonary disease, unspecified: Secondary | ICD-10-CM | POA: Diagnosis not present

## 2019-01-31 DIAGNOSIS — J4541 Moderate persistent asthma with (acute) exacerbation: Secondary | ICD-10-CM | POA: Diagnosis not present

## 2019-02-02 DIAGNOSIS — E785 Hyperlipidemia, unspecified: Secondary | ICD-10-CM | POA: Diagnosis not present

## 2019-02-02 DIAGNOSIS — J449 Chronic obstructive pulmonary disease, unspecified: Secondary | ICD-10-CM | POA: Diagnosis not present

## 2019-02-02 DIAGNOSIS — R739 Hyperglycemia, unspecified: Secondary | ICD-10-CM | POA: Diagnosis not present

## 2019-02-02 DIAGNOSIS — Z79899 Other long term (current) drug therapy: Secondary | ICD-10-CM | POA: Diagnosis not present

## 2019-02-05 DIAGNOSIS — M1711 Unilateral primary osteoarthritis, right knee: Secondary | ICD-10-CM | POA: Diagnosis not present

## 2019-02-06 DIAGNOSIS — J439 Emphysema, unspecified: Secondary | ICD-10-CM | POA: Diagnosis not present

## 2019-02-06 DIAGNOSIS — I700 Atherosclerosis of aorta: Secondary | ICD-10-CM | POA: Diagnosis not present

## 2019-02-06 DIAGNOSIS — R918 Other nonspecific abnormal finding of lung field: Secondary | ICD-10-CM | POA: Diagnosis not present

## 2019-02-17 DIAGNOSIS — Z79899 Other long term (current) drug therapy: Secondary | ICD-10-CM | POA: Diagnosis not present

## 2019-02-17 DIAGNOSIS — D638 Anemia in other chronic diseases classified elsewhere: Secondary | ICD-10-CM | POA: Diagnosis not present

## 2019-02-17 DIAGNOSIS — Z8673 Personal history of transient ischemic attack (TIA), and cerebral infarction without residual deficits: Secondary | ICD-10-CM | POA: Diagnosis not present

## 2019-02-17 DIAGNOSIS — Z7401 Bed confinement status: Secondary | ICD-10-CM | POA: Diagnosis not present

## 2019-02-17 DIAGNOSIS — Z888 Allergy status to other drugs, medicaments and biological substances status: Secondary | ICD-10-CM | POA: Diagnosis not present

## 2019-02-17 DIAGNOSIS — E7800 Pure hypercholesterolemia, unspecified: Secondary | ICD-10-CM | POA: Diagnosis not present

## 2019-02-17 DIAGNOSIS — Z881 Allergy status to other antibiotic agents status: Secondary | ICD-10-CM | POA: Diagnosis not present

## 2019-02-17 DIAGNOSIS — I509 Heart failure, unspecified: Secondary | ICD-10-CM | POA: Diagnosis not present

## 2019-02-17 DIAGNOSIS — E785 Hyperlipidemia, unspecified: Secondary | ICD-10-CM | POA: Diagnosis not present

## 2019-02-17 DIAGNOSIS — R069 Unspecified abnormalities of breathing: Secondary | ICD-10-CM | POA: Diagnosis not present

## 2019-02-17 DIAGNOSIS — G4733 Obstructive sleep apnea (adult) (pediatric): Secondary | ICD-10-CM | POA: Diagnosis not present

## 2019-02-17 DIAGNOSIS — R262 Difficulty in walking, not elsewhere classified: Secondary | ICD-10-CM | POA: Diagnosis not present

## 2019-02-17 DIAGNOSIS — J441 Chronic obstructive pulmonary disease with (acute) exacerbation: Secondary | ICD-10-CM | POA: Diagnosis not present

## 2019-02-17 DIAGNOSIS — G9050 Complex regional pain syndrome I, unspecified: Secondary | ICD-10-CM | POA: Diagnosis not present

## 2019-02-17 DIAGNOSIS — R0689 Other abnormalities of breathing: Secondary | ICD-10-CM | POA: Diagnosis not present

## 2019-02-17 DIAGNOSIS — I959 Hypotension, unspecified: Secondary | ICD-10-CM | POA: Diagnosis not present

## 2019-02-17 DIAGNOSIS — Z882 Allergy status to sulfonamides status: Secondary | ICD-10-CM | POA: Diagnosis not present

## 2019-02-17 DIAGNOSIS — J9621 Acute and chronic respiratory failure with hypoxia: Secondary | ICD-10-CM | POA: Diagnosis not present

## 2019-02-17 DIAGNOSIS — Z9981 Dependence on supplemental oxygen: Secondary | ICD-10-CM | POA: Diagnosis not present

## 2019-02-17 DIAGNOSIS — Z885 Allergy status to narcotic agent status: Secondary | ICD-10-CM | POA: Diagnosis not present

## 2019-02-17 DIAGNOSIS — R5381 Other malaise: Secondary | ICD-10-CM | POA: Diagnosis not present

## 2019-02-17 DIAGNOSIS — K219 Gastro-esophageal reflux disease without esophagitis: Secondary | ICD-10-CM | POA: Diagnosis not present

## 2019-02-17 DIAGNOSIS — R0602 Shortness of breath: Secondary | ICD-10-CM | POA: Diagnosis not present

## 2019-02-17 DIAGNOSIS — R0902 Hypoxemia: Secondary | ICD-10-CM | POA: Diagnosis not present

## 2019-02-17 DIAGNOSIS — I5022 Chronic systolic (congestive) heart failure: Secondary | ICD-10-CM | POA: Diagnosis not present

## 2019-02-17 DIAGNOSIS — Z8679 Personal history of other diseases of the circulatory system: Secondary | ICD-10-CM | POA: Diagnosis not present

## 2019-02-28 DIAGNOSIS — J449 Chronic obstructive pulmonary disease, unspecified: Secondary | ICD-10-CM | POA: Diagnosis not present

## 2019-03-02 DIAGNOSIS — J4541 Moderate persistent asthma with (acute) exacerbation: Secondary | ICD-10-CM | POA: Diagnosis not present

## 2019-03-12 DIAGNOSIS — J9611 Chronic respiratory failure with hypoxia: Secondary | ICD-10-CM | POA: Diagnosis not present

## 2019-03-12 DIAGNOSIS — E785 Hyperlipidemia, unspecified: Secondary | ICD-10-CM | POA: Diagnosis not present

## 2019-03-12 DIAGNOSIS — R739 Hyperglycemia, unspecified: Secondary | ICD-10-CM | POA: Diagnosis not present

## 2019-03-31 DIAGNOSIS — J449 Chronic obstructive pulmonary disease, unspecified: Secondary | ICD-10-CM | POA: Diagnosis not present

## 2019-04-02 DIAGNOSIS — J4541 Moderate persistent asthma with (acute) exacerbation: Secondary | ICD-10-CM | POA: Diagnosis not present

## 2019-04-04 DIAGNOSIS — R809 Proteinuria, unspecified: Secondary | ICD-10-CM | POA: Diagnosis not present

## 2019-04-12 DIAGNOSIS — I10 Essential (primary) hypertension: Secondary | ICD-10-CM | POA: Diagnosis not present

## 2019-04-12 DIAGNOSIS — R809 Proteinuria, unspecified: Secondary | ICD-10-CM | POA: Diagnosis not present

## 2019-05-01 DIAGNOSIS — J449 Chronic obstructive pulmonary disease, unspecified: Secondary | ICD-10-CM | POA: Diagnosis not present

## 2019-05-02 DIAGNOSIS — H5213 Myopia, bilateral: Secondary | ICD-10-CM | POA: Diagnosis not present

## 2019-05-02 DIAGNOSIS — H52223 Regular astigmatism, bilateral: Secondary | ICD-10-CM | POA: Diagnosis not present

## 2019-05-02 DIAGNOSIS — H25813 Combined forms of age-related cataract, bilateral: Secondary | ICD-10-CM | POA: Diagnosis not present

## 2019-05-03 DIAGNOSIS — J4541 Moderate persistent asthma with (acute) exacerbation: Secondary | ICD-10-CM | POA: Diagnosis not present

## 2019-05-14 DIAGNOSIS — Z79899 Other long term (current) drug therapy: Secondary | ICD-10-CM | POA: Diagnosis not present

## 2019-05-14 DIAGNOSIS — G4700 Insomnia, unspecified: Secondary | ICD-10-CM | POA: Diagnosis not present

## 2019-05-14 DIAGNOSIS — R739 Hyperglycemia, unspecified: Secondary | ICD-10-CM | POA: Diagnosis not present

## 2019-05-14 DIAGNOSIS — E739 Lactose intolerance, unspecified: Secondary | ICD-10-CM | POA: Diagnosis not present

## 2019-05-14 DIAGNOSIS — E785 Hyperlipidemia, unspecified: Secondary | ICD-10-CM | POA: Diagnosis not present

## 2019-05-25 DIAGNOSIS — R2681 Unsteadiness on feet: Secondary | ICD-10-CM | POA: Diagnosis not present

## 2019-05-25 DIAGNOSIS — Z89512 Acquired absence of left leg below knee: Secondary | ICD-10-CM | POA: Diagnosis not present

## 2019-05-25 DIAGNOSIS — M25651 Stiffness of right hip, not elsewhere classified: Secondary | ICD-10-CM | POA: Diagnosis not present

## 2019-05-25 DIAGNOSIS — M6281 Muscle weakness (generalized): Secondary | ICD-10-CM | POA: Diagnosis not present

## 2019-05-25 DIAGNOSIS — M1711 Unilateral primary osteoarthritis, right knee: Secondary | ICD-10-CM | POA: Diagnosis not present

## 2019-05-25 DIAGNOSIS — M25561 Pain in right knee: Secondary | ICD-10-CM | POA: Diagnosis not present

## 2019-05-28 DIAGNOSIS — M1711 Unilateral primary osteoarthritis, right knee: Secondary | ICD-10-CM | POA: Diagnosis not present

## 2019-05-28 DIAGNOSIS — M25651 Stiffness of right hip, not elsewhere classified: Secondary | ICD-10-CM | POA: Diagnosis not present

## 2019-05-28 DIAGNOSIS — Z89512 Acquired absence of left leg below knee: Secondary | ICD-10-CM | POA: Diagnosis not present

## 2019-05-28 DIAGNOSIS — R2681 Unsteadiness on feet: Secondary | ICD-10-CM | POA: Diagnosis not present

## 2019-05-28 DIAGNOSIS — M6281 Muscle weakness (generalized): Secondary | ICD-10-CM | POA: Diagnosis not present

## 2019-05-28 DIAGNOSIS — M25561 Pain in right knee: Secondary | ICD-10-CM | POA: Diagnosis not present

## 2019-05-31 DIAGNOSIS — J449 Chronic obstructive pulmonary disease, unspecified: Secondary | ICD-10-CM | POA: Diagnosis not present

## 2019-05-31 DIAGNOSIS — E042 Nontoxic multinodular goiter: Secondary | ICD-10-CM | POA: Diagnosis not present

## 2019-06-02 DIAGNOSIS — J4541 Moderate persistent asthma with (acute) exacerbation: Secondary | ICD-10-CM | POA: Diagnosis not present

## 2019-06-05 DIAGNOSIS — R2681 Unsteadiness on feet: Secondary | ICD-10-CM | POA: Diagnosis not present

## 2019-06-05 DIAGNOSIS — M1711 Unilateral primary osteoarthritis, right knee: Secondary | ICD-10-CM | POA: Diagnosis not present

## 2019-06-05 DIAGNOSIS — Z89512 Acquired absence of left leg below knee: Secondary | ICD-10-CM | POA: Diagnosis not present

## 2019-06-05 DIAGNOSIS — M25561 Pain in right knee: Secondary | ICD-10-CM | POA: Diagnosis not present

## 2019-06-05 DIAGNOSIS — M6281 Muscle weakness (generalized): Secondary | ICD-10-CM | POA: Diagnosis not present

## 2019-06-05 DIAGNOSIS — M25661 Stiffness of right knee, not elsewhere classified: Secondary | ICD-10-CM | POA: Diagnosis not present

## 2019-06-05 DIAGNOSIS — M25651 Stiffness of right hip, not elsewhere classified: Secondary | ICD-10-CM | POA: Diagnosis not present

## 2019-06-07 DIAGNOSIS — M6281 Muscle weakness (generalized): Secondary | ICD-10-CM | POA: Diagnosis not present

## 2019-06-07 DIAGNOSIS — M25661 Stiffness of right knee, not elsewhere classified: Secondary | ICD-10-CM | POA: Diagnosis not present

## 2019-06-07 DIAGNOSIS — M25651 Stiffness of right hip, not elsewhere classified: Secondary | ICD-10-CM | POA: Diagnosis not present

## 2019-06-07 DIAGNOSIS — M25561 Pain in right knee: Secondary | ICD-10-CM | POA: Diagnosis not present

## 2019-06-07 DIAGNOSIS — R2681 Unsteadiness on feet: Secondary | ICD-10-CM | POA: Diagnosis not present

## 2019-06-07 DIAGNOSIS — Z89512 Acquired absence of left leg below knee: Secondary | ICD-10-CM | POA: Diagnosis not present

## 2019-06-07 DIAGNOSIS — M1711 Unilateral primary osteoarthritis, right knee: Secondary | ICD-10-CM | POA: Diagnosis not present

## 2019-06-08 DIAGNOSIS — E042 Nontoxic multinodular goiter: Secondary | ICD-10-CM | POA: Diagnosis not present

## 2019-06-11 DIAGNOSIS — S88919A Complete traumatic amputation of unspecified lower leg, level unspecified, initial encounter: Secondary | ICD-10-CM | POA: Diagnosis not present

## 2019-06-11 DIAGNOSIS — R739 Hyperglycemia, unspecified: Secondary | ICD-10-CM | POA: Diagnosis not present

## 2019-06-11 DIAGNOSIS — M810 Age-related osteoporosis without current pathological fracture: Secondary | ICD-10-CM | POA: Diagnosis not present

## 2019-06-12 DIAGNOSIS — M25661 Stiffness of right knee, not elsewhere classified: Secondary | ICD-10-CM | POA: Diagnosis not present

## 2019-06-12 DIAGNOSIS — M1711 Unilateral primary osteoarthritis, right knee: Secondary | ICD-10-CM | POA: Diagnosis not present

## 2019-06-12 DIAGNOSIS — R2681 Unsteadiness on feet: Secondary | ICD-10-CM | POA: Diagnosis not present

## 2019-06-12 DIAGNOSIS — M6281 Muscle weakness (generalized): Secondary | ICD-10-CM | POA: Diagnosis not present

## 2019-06-12 DIAGNOSIS — M25651 Stiffness of right hip, not elsewhere classified: Secondary | ICD-10-CM | POA: Diagnosis not present

## 2019-06-12 DIAGNOSIS — M25561 Pain in right knee: Secondary | ICD-10-CM | POA: Diagnosis not present

## 2019-06-12 DIAGNOSIS — Z89512 Acquired absence of left leg below knee: Secondary | ICD-10-CM | POA: Diagnosis not present

## 2019-06-14 DIAGNOSIS — M1711 Unilateral primary osteoarthritis, right knee: Secondary | ICD-10-CM | POA: Diagnosis not present

## 2019-06-14 DIAGNOSIS — Z89512 Acquired absence of left leg below knee: Secondary | ICD-10-CM | POA: Diagnosis not present

## 2019-06-14 DIAGNOSIS — M25651 Stiffness of right hip, not elsewhere classified: Secondary | ICD-10-CM | POA: Diagnosis not present

## 2019-06-14 DIAGNOSIS — M25561 Pain in right knee: Secondary | ICD-10-CM | POA: Diagnosis not present

## 2019-06-14 DIAGNOSIS — M25661 Stiffness of right knee, not elsewhere classified: Secondary | ICD-10-CM | POA: Diagnosis not present

## 2019-06-14 DIAGNOSIS — R2681 Unsteadiness on feet: Secondary | ICD-10-CM | POA: Diagnosis not present

## 2019-06-14 DIAGNOSIS — M6281 Muscle weakness (generalized): Secondary | ICD-10-CM | POA: Diagnosis not present

## 2019-06-19 DIAGNOSIS — M25651 Stiffness of right hip, not elsewhere classified: Secondary | ICD-10-CM | POA: Diagnosis not present

## 2019-06-19 DIAGNOSIS — M1711 Unilateral primary osteoarthritis, right knee: Secondary | ICD-10-CM | POA: Diagnosis not present

## 2019-06-19 DIAGNOSIS — M6281 Muscle weakness (generalized): Secondary | ICD-10-CM | POA: Diagnosis not present

## 2019-06-19 DIAGNOSIS — M25661 Stiffness of right knee, not elsewhere classified: Secondary | ICD-10-CM | POA: Diagnosis not present

## 2019-06-19 DIAGNOSIS — R2681 Unsteadiness on feet: Secondary | ICD-10-CM | POA: Diagnosis not present

## 2019-06-19 DIAGNOSIS — M25561 Pain in right knee: Secondary | ICD-10-CM | POA: Diagnosis not present

## 2019-06-19 DIAGNOSIS — Z89512 Acquired absence of left leg below knee: Secondary | ICD-10-CM | POA: Diagnosis not present

## 2019-06-21 DIAGNOSIS — M25561 Pain in right knee: Secondary | ICD-10-CM | POA: Diagnosis not present

## 2019-06-21 DIAGNOSIS — R2681 Unsteadiness on feet: Secondary | ICD-10-CM | POA: Diagnosis not present

## 2019-06-21 DIAGNOSIS — M25661 Stiffness of right knee, not elsewhere classified: Secondary | ICD-10-CM | POA: Diagnosis not present

## 2019-06-21 DIAGNOSIS — M25651 Stiffness of right hip, not elsewhere classified: Secondary | ICD-10-CM | POA: Diagnosis not present

## 2019-06-21 DIAGNOSIS — M1711 Unilateral primary osteoarthritis, right knee: Secondary | ICD-10-CM | POA: Diagnosis not present

## 2019-06-21 DIAGNOSIS — M6281 Muscle weakness (generalized): Secondary | ICD-10-CM | POA: Diagnosis not present

## 2019-06-21 DIAGNOSIS — Z89512 Acquired absence of left leg below knee: Secondary | ICD-10-CM | POA: Diagnosis not present

## 2019-07-01 DIAGNOSIS — J449 Chronic obstructive pulmonary disease, unspecified: Secondary | ICD-10-CM | POA: Diagnosis not present

## 2019-07-03 DIAGNOSIS — J4541 Moderate persistent asthma with (acute) exacerbation: Secondary | ICD-10-CM | POA: Diagnosis not present

## 2019-07-10 DIAGNOSIS — M25561 Pain in right knee: Secondary | ICD-10-CM | POA: Diagnosis not present

## 2019-07-10 DIAGNOSIS — R2681 Unsteadiness on feet: Secondary | ICD-10-CM | POA: Diagnosis not present

## 2019-07-10 DIAGNOSIS — M25661 Stiffness of right knee, not elsewhere classified: Secondary | ICD-10-CM | POA: Diagnosis not present

## 2019-07-10 DIAGNOSIS — M25651 Stiffness of right hip, not elsewhere classified: Secondary | ICD-10-CM | POA: Diagnosis not present

## 2019-07-10 DIAGNOSIS — M6281 Muscle weakness (generalized): Secondary | ICD-10-CM | POA: Diagnosis not present

## 2019-07-10 DIAGNOSIS — M1711 Unilateral primary osteoarthritis, right knee: Secondary | ICD-10-CM | POA: Diagnosis not present

## 2019-07-10 DIAGNOSIS — M25551 Pain in right hip: Secondary | ICD-10-CM | POA: Diagnosis not present

## 2019-07-10 DIAGNOSIS — Z89512 Acquired absence of left leg below knee: Secondary | ICD-10-CM | POA: Diagnosis not present

## 2019-07-12 DIAGNOSIS — M2550 Pain in unspecified joint: Secondary | ICD-10-CM | POA: Diagnosis not present

## 2019-07-12 DIAGNOSIS — R739 Hyperglycemia, unspecified: Secondary | ICD-10-CM | POA: Diagnosis not present

## 2019-07-17 DIAGNOSIS — M25551 Pain in right hip: Secondary | ICD-10-CM | POA: Diagnosis not present

## 2019-07-17 DIAGNOSIS — M25651 Stiffness of right hip, not elsewhere classified: Secondary | ICD-10-CM | POA: Diagnosis not present

## 2019-07-17 DIAGNOSIS — R2681 Unsteadiness on feet: Secondary | ICD-10-CM | POA: Diagnosis not present

## 2019-07-17 DIAGNOSIS — Z89512 Acquired absence of left leg below knee: Secondary | ICD-10-CM | POA: Diagnosis not present

## 2019-07-17 DIAGNOSIS — M25561 Pain in right knee: Secondary | ICD-10-CM | POA: Diagnosis not present

## 2019-07-17 DIAGNOSIS — M25661 Stiffness of right knee, not elsewhere classified: Secondary | ICD-10-CM | POA: Diagnosis not present

## 2019-07-17 DIAGNOSIS — M1711 Unilateral primary osteoarthritis, right knee: Secondary | ICD-10-CM | POA: Diagnosis not present

## 2019-07-17 DIAGNOSIS — M6281 Muscle weakness (generalized): Secondary | ICD-10-CM | POA: Diagnosis not present

## 2019-07-19 DIAGNOSIS — M25561 Pain in right knee: Secondary | ICD-10-CM | POA: Diagnosis not present

## 2019-07-19 DIAGNOSIS — M25551 Pain in right hip: Secondary | ICD-10-CM | POA: Diagnosis not present

## 2019-07-19 DIAGNOSIS — Z89512 Acquired absence of left leg below knee: Secondary | ICD-10-CM | POA: Diagnosis not present

## 2019-07-19 DIAGNOSIS — R2681 Unsteadiness on feet: Secondary | ICD-10-CM | POA: Diagnosis not present

## 2019-07-19 DIAGNOSIS — M25651 Stiffness of right hip, not elsewhere classified: Secondary | ICD-10-CM | POA: Diagnosis not present

## 2019-07-19 DIAGNOSIS — M1711 Unilateral primary osteoarthritis, right knee: Secondary | ICD-10-CM | POA: Diagnosis not present

## 2019-07-19 DIAGNOSIS — M6281 Muscle weakness (generalized): Secondary | ICD-10-CM | POA: Diagnosis not present

## 2019-07-19 DIAGNOSIS — M25661 Stiffness of right knee, not elsewhere classified: Secondary | ICD-10-CM | POA: Diagnosis not present

## 2019-07-24 DIAGNOSIS — Z1231 Encounter for screening mammogram for malignant neoplasm of breast: Secondary | ICD-10-CM | POA: Diagnosis not present

## 2019-07-31 DIAGNOSIS — J449 Chronic obstructive pulmonary disease, unspecified: Secondary | ICD-10-CM | POA: Diagnosis not present

## 2019-08-01 DIAGNOSIS — M25561 Pain in right knee: Secondary | ICD-10-CM | POA: Diagnosis not present

## 2019-08-01 DIAGNOSIS — R2681 Unsteadiness on feet: Secondary | ICD-10-CM | POA: Diagnosis not present

## 2019-08-01 DIAGNOSIS — M25661 Stiffness of right knee, not elsewhere classified: Secondary | ICD-10-CM | POA: Diagnosis not present

## 2019-08-01 DIAGNOSIS — M6281 Muscle weakness (generalized): Secondary | ICD-10-CM | POA: Diagnosis not present

## 2019-08-01 DIAGNOSIS — M25651 Stiffness of right hip, not elsewhere classified: Secondary | ICD-10-CM | POA: Diagnosis not present

## 2019-08-01 DIAGNOSIS — M1711 Unilateral primary osteoarthritis, right knee: Secondary | ICD-10-CM | POA: Diagnosis not present

## 2019-08-01 DIAGNOSIS — Z89512 Acquired absence of left leg below knee: Secondary | ICD-10-CM | POA: Diagnosis not present

## 2019-08-01 DIAGNOSIS — M25551 Pain in right hip: Secondary | ICD-10-CM | POA: Diagnosis not present

## 2019-08-02 DIAGNOSIS — J4541 Moderate persistent asthma with (acute) exacerbation: Secondary | ICD-10-CM | POA: Diagnosis not present

## 2019-08-03 DIAGNOSIS — M25651 Stiffness of right hip, not elsewhere classified: Secondary | ICD-10-CM | POA: Diagnosis not present

## 2019-08-03 DIAGNOSIS — R2681 Unsteadiness on feet: Secondary | ICD-10-CM | POA: Diagnosis not present

## 2019-08-03 DIAGNOSIS — Z89512 Acquired absence of left leg below knee: Secondary | ICD-10-CM | POA: Diagnosis not present

## 2019-08-03 DIAGNOSIS — M25551 Pain in right hip: Secondary | ICD-10-CM | POA: Diagnosis not present

## 2019-08-03 DIAGNOSIS — M25661 Stiffness of right knee, not elsewhere classified: Secondary | ICD-10-CM | POA: Diagnosis not present

## 2019-08-03 DIAGNOSIS — M1711 Unilateral primary osteoarthritis, right knee: Secondary | ICD-10-CM | POA: Diagnosis not present

## 2019-08-03 DIAGNOSIS — M6281 Muscle weakness (generalized): Secondary | ICD-10-CM | POA: Diagnosis not present

## 2019-08-03 DIAGNOSIS — M25561 Pain in right knee: Secondary | ICD-10-CM | POA: Diagnosis not present

## 2019-08-07 DIAGNOSIS — M25551 Pain in right hip: Secondary | ICD-10-CM | POA: Diagnosis not present

## 2019-08-07 DIAGNOSIS — M25561 Pain in right knee: Secondary | ICD-10-CM | POA: Diagnosis not present

## 2019-08-07 DIAGNOSIS — R2681 Unsteadiness on feet: Secondary | ICD-10-CM | POA: Diagnosis not present

## 2019-08-07 DIAGNOSIS — M25661 Stiffness of right knee, not elsewhere classified: Secondary | ICD-10-CM | POA: Diagnosis not present

## 2019-08-07 DIAGNOSIS — M6281 Muscle weakness (generalized): Secondary | ICD-10-CM | POA: Diagnosis not present

## 2019-08-07 DIAGNOSIS — Z89512 Acquired absence of left leg below knee: Secondary | ICD-10-CM | POA: Diagnosis not present

## 2019-08-07 DIAGNOSIS — M25651 Stiffness of right hip, not elsewhere classified: Secondary | ICD-10-CM | POA: Diagnosis not present

## 2019-08-07 DIAGNOSIS — M1711 Unilateral primary osteoarthritis, right knee: Secondary | ICD-10-CM | POA: Diagnosis not present

## 2019-08-09 DIAGNOSIS — Z79899 Other long term (current) drug therapy: Secondary | ICD-10-CM | POA: Diagnosis not present

## 2019-08-09 DIAGNOSIS — Z89512 Acquired absence of left leg below knee: Secondary | ICD-10-CM | POA: Diagnosis not present

## 2019-08-09 DIAGNOSIS — R739 Hyperglycemia, unspecified: Secondary | ICD-10-CM | POA: Diagnosis not present

## 2019-08-09 DIAGNOSIS — Z23 Encounter for immunization: Secondary | ICD-10-CM | POA: Diagnosis not present

## 2019-08-09 DIAGNOSIS — M25561 Pain in right knee: Secondary | ICD-10-CM | POA: Diagnosis not present

## 2019-08-09 DIAGNOSIS — M25661 Stiffness of right knee, not elsewhere classified: Secondary | ICD-10-CM | POA: Diagnosis not present

## 2019-08-09 DIAGNOSIS — M25551 Pain in right hip: Secondary | ICD-10-CM | POA: Diagnosis not present

## 2019-08-09 DIAGNOSIS — R350 Frequency of micturition: Secondary | ICD-10-CM | POA: Diagnosis not present

## 2019-08-09 DIAGNOSIS — R2681 Unsteadiness on feet: Secondary | ICD-10-CM | POA: Diagnosis not present

## 2019-08-09 DIAGNOSIS — M6281 Muscle weakness (generalized): Secondary | ICD-10-CM | POA: Diagnosis not present

## 2019-08-09 DIAGNOSIS — M1711 Unilateral primary osteoarthritis, right knee: Secondary | ICD-10-CM | POA: Diagnosis not present

## 2019-08-09 DIAGNOSIS — M25651 Stiffness of right hip, not elsewhere classified: Secondary | ICD-10-CM | POA: Diagnosis not present

## 2019-08-09 DIAGNOSIS — E785 Hyperlipidemia, unspecified: Secondary | ICD-10-CM | POA: Diagnosis not present

## 2019-08-14 DIAGNOSIS — G629 Polyneuropathy, unspecified: Secondary | ICD-10-CM | POA: Diagnosis not present

## 2019-08-14 DIAGNOSIS — M1711 Unilateral primary osteoarthritis, right knee: Secondary | ICD-10-CM | POA: Diagnosis not present

## 2019-08-14 DIAGNOSIS — Z89611 Acquired absence of right leg above knee: Secondary | ICD-10-CM | POA: Diagnosis not present

## 2019-08-14 DIAGNOSIS — R2681 Unsteadiness on feet: Secondary | ICD-10-CM | POA: Diagnosis not present

## 2019-08-14 DIAGNOSIS — M25661 Stiffness of right knee, not elsewhere classified: Secondary | ICD-10-CM | POA: Diagnosis not present

## 2019-08-14 DIAGNOSIS — M25561 Pain in right knee: Secondary | ICD-10-CM | POA: Diagnosis not present

## 2019-08-14 DIAGNOSIS — Z89512 Acquired absence of left leg below knee: Secondary | ICD-10-CM | POA: Diagnosis not present

## 2019-08-14 DIAGNOSIS — J449 Chronic obstructive pulmonary disease, unspecified: Secondary | ICD-10-CM | POA: Diagnosis not present

## 2019-08-14 DIAGNOSIS — M6281 Muscle weakness (generalized): Secondary | ICD-10-CM | POA: Diagnosis not present

## 2019-08-14 DIAGNOSIS — R069 Unspecified abnormalities of breathing: Secondary | ICD-10-CM | POA: Diagnosis not present

## 2019-08-14 DIAGNOSIS — M25651 Stiffness of right hip, not elsewhere classified: Secondary | ICD-10-CM | POA: Diagnosis not present

## 2019-08-14 DIAGNOSIS — R6889 Other general symptoms and signs: Secondary | ICD-10-CM | POA: Diagnosis not present

## 2019-08-14 DIAGNOSIS — M25551 Pain in right hip: Secondary | ICD-10-CM | POA: Diagnosis not present

## 2019-08-27 DIAGNOSIS — N6311 Unspecified lump in the right breast, upper outer quadrant: Secondary | ICD-10-CM | POA: Diagnosis not present

## 2019-08-27 DIAGNOSIS — R928 Other abnormal and inconclusive findings on diagnostic imaging of breast: Secondary | ICD-10-CM | POA: Diagnosis not present

## 2019-08-31 DIAGNOSIS — J449 Chronic obstructive pulmonary disease, unspecified: Secondary | ICD-10-CM | POA: Diagnosis not present

## 2019-09-02 DIAGNOSIS — J4541 Moderate persistent asthma with (acute) exacerbation: Secondary | ICD-10-CM | POA: Diagnosis not present

## 2019-09-05 DIAGNOSIS — N390 Urinary tract infection, site not specified: Secondary | ICD-10-CM | POA: Diagnosis not present

## 2019-09-05 DIAGNOSIS — R05 Cough: Secondary | ICD-10-CM | POA: Diagnosis not present

## 2019-09-05 DIAGNOSIS — R5381 Other malaise: Secondary | ICD-10-CM | POA: Diagnosis not present

## 2019-09-05 DIAGNOSIS — Z20822 Contact with and (suspected) exposure to covid-19: Secondary | ICD-10-CM | POA: Diagnosis not present

## 2019-09-07 DIAGNOSIS — J449 Chronic obstructive pulmonary disease, unspecified: Secondary | ICD-10-CM | POA: Diagnosis not present

## 2019-09-07 DIAGNOSIS — J0100 Acute maxillary sinusitis, unspecified: Secondary | ICD-10-CM | POA: Diagnosis not present

## 2019-09-07 DIAGNOSIS — J208 Acute bronchitis due to other specified organisms: Secondary | ICD-10-CM | POA: Diagnosis not present

## 2019-09-07 DIAGNOSIS — J9611 Chronic respiratory failure with hypoxia: Secondary | ICD-10-CM | POA: Diagnosis not present

## 2019-09-15 DIAGNOSIS — G43909 Migraine, unspecified, not intractable, without status migrainosus: Secondary | ICD-10-CM | POA: Diagnosis not present

## 2019-09-15 DIAGNOSIS — R519 Headache, unspecified: Secondary | ICD-10-CM | POA: Diagnosis not present

## 2019-09-15 DIAGNOSIS — R0902 Hypoxemia: Secondary | ICD-10-CM | POA: Diagnosis not present

## 2019-09-15 DIAGNOSIS — R0602 Shortness of breath: Secondary | ICD-10-CM | POA: Diagnosis not present

## 2019-09-15 DIAGNOSIS — J441 Chronic obstructive pulmonary disease with (acute) exacerbation: Secondary | ICD-10-CM | POA: Diagnosis not present

## 2019-09-15 DIAGNOSIS — Z20822 Contact with and (suspected) exposure to covid-19: Secondary | ICD-10-CM | POA: Diagnosis not present

## 2019-09-15 DIAGNOSIS — G4489 Other headache syndrome: Secondary | ICD-10-CM | POA: Diagnosis not present

## 2019-10-01 DIAGNOSIS — J449 Chronic obstructive pulmonary disease, unspecified: Secondary | ICD-10-CM | POA: Diagnosis not present

## 2019-10-03 DIAGNOSIS — J4541 Moderate persistent asthma with (acute) exacerbation: Secondary | ICD-10-CM | POA: Diagnosis not present

## 2019-10-09 DIAGNOSIS — I10 Essential (primary) hypertension: Secondary | ICD-10-CM | POA: Diagnosis not present

## 2019-10-09 DIAGNOSIS — S8991XA Unspecified injury of right lower leg, initial encounter: Secondary | ICD-10-CM | POA: Diagnosis not present

## 2019-10-09 DIAGNOSIS — R52 Pain, unspecified: Secondary | ICD-10-CM | POA: Diagnosis not present

## 2019-10-09 DIAGNOSIS — J449 Chronic obstructive pulmonary disease, unspecified: Secondary | ICD-10-CM | POA: Diagnosis not present

## 2019-10-09 DIAGNOSIS — R279 Unspecified lack of coordination: Secondary | ICD-10-CM | POA: Diagnosis not present

## 2019-10-09 DIAGNOSIS — Z743 Need for continuous supervision: Secondary | ICD-10-CM | POA: Diagnosis not present

## 2019-10-09 DIAGNOSIS — M1711 Unilateral primary osteoarthritis, right knee: Secondary | ICD-10-CM | POA: Diagnosis not present

## 2019-10-09 DIAGNOSIS — Z89612 Acquired absence of left leg above knee: Secondary | ICD-10-CM | POA: Diagnosis not present

## 2019-10-09 DIAGNOSIS — S99911A Unspecified injury of right ankle, initial encounter: Secondary | ICD-10-CM | POA: Diagnosis not present

## 2019-10-09 DIAGNOSIS — M25561 Pain in right knee: Secondary | ICD-10-CM | POA: Diagnosis not present

## 2019-10-09 DIAGNOSIS — W19XXXA Unspecified fall, initial encounter: Secondary | ICD-10-CM | POA: Diagnosis not present

## 2019-10-09 DIAGNOSIS — M85871 Other specified disorders of bone density and structure, right ankle and foot: Secondary | ICD-10-CM | POA: Diagnosis not present

## 2019-10-09 DIAGNOSIS — Z9981 Dependence on supplemental oxygen: Secondary | ICD-10-CM | POA: Diagnosis not present

## 2019-10-10 DIAGNOSIS — M1710 Unilateral primary osteoarthritis, unspecified knee: Secondary | ICD-10-CM | POA: Diagnosis not present

## 2019-10-10 DIAGNOSIS — J449 Chronic obstructive pulmonary disease, unspecified: Secondary | ICD-10-CM | POA: Diagnosis not present

## 2019-10-29 DIAGNOSIS — J449 Chronic obstructive pulmonary disease, unspecified: Secondary | ICD-10-CM | POA: Diagnosis not present

## 2019-10-31 DIAGNOSIS — J4541 Moderate persistent asthma with (acute) exacerbation: Secondary | ICD-10-CM | POA: Diagnosis not present

## 2019-11-08 DIAGNOSIS — E785 Hyperlipidemia, unspecified: Secondary | ICD-10-CM | POA: Diagnosis not present

## 2019-11-08 DIAGNOSIS — Z79899 Other long term (current) drug therapy: Secondary | ICD-10-CM | POA: Diagnosis not present

## 2019-11-29 DIAGNOSIS — J449 Chronic obstructive pulmonary disease, unspecified: Secondary | ICD-10-CM | POA: Diagnosis not present

## 2019-12-11 DIAGNOSIS — R739 Hyperglycemia, unspecified: Secondary | ICD-10-CM | POA: Diagnosis not present

## 2019-12-11 DIAGNOSIS — Z79899 Other long term (current) drug therapy: Secondary | ICD-10-CM | POA: Diagnosis not present

## 2019-12-11 DIAGNOSIS — R21 Rash and other nonspecific skin eruption: Secondary | ICD-10-CM | POA: Diagnosis not present

## 2019-12-11 DIAGNOSIS — E785 Hyperlipidemia, unspecified: Secondary | ICD-10-CM | POA: Diagnosis not present

## 2019-12-29 DIAGNOSIS — J449 Chronic obstructive pulmonary disease, unspecified: Secondary | ICD-10-CM | POA: Diagnosis not present

## 2020-01-08 DIAGNOSIS — R739 Hyperglycemia, unspecified: Secondary | ICD-10-CM | POA: Diagnosis not present

## 2020-01-08 DIAGNOSIS — J449 Chronic obstructive pulmonary disease, unspecified: Secondary | ICD-10-CM | POA: Diagnosis not present

## 2020-01-08 DIAGNOSIS — E785 Hyperlipidemia, unspecified: Secondary | ICD-10-CM | POA: Diagnosis not present

## 2020-01-29 DIAGNOSIS — J449 Chronic obstructive pulmonary disease, unspecified: Secondary | ICD-10-CM | POA: Diagnosis not present

## 2020-02-08 DIAGNOSIS — S88919A Complete traumatic amputation of unspecified lower leg, level unspecified, initial encounter: Secondary | ICD-10-CM | POA: Diagnosis not present

## 2020-02-08 DIAGNOSIS — Z79899 Other long term (current) drug therapy: Secondary | ICD-10-CM | POA: Diagnosis not present

## 2020-02-08 DIAGNOSIS — J449 Chronic obstructive pulmonary disease, unspecified: Secondary | ICD-10-CM | POA: Diagnosis not present

## 2020-03-07 DIAGNOSIS — E785 Hyperlipidemia, unspecified: Secondary | ICD-10-CM | POA: Diagnosis not present

## 2020-03-07 DIAGNOSIS — R739 Hyperglycemia, unspecified: Secondary | ICD-10-CM | POA: Diagnosis not present

## 2020-03-07 DIAGNOSIS — Z79899 Other long term (current) drug therapy: Secondary | ICD-10-CM | POA: Diagnosis not present

## 2020-03-07 DIAGNOSIS — L8990 Pressure ulcer of unspecified site, unspecified stage: Secondary | ICD-10-CM | POA: Diagnosis not present

## 2020-04-02 DIAGNOSIS — M7581 Other shoulder lesions, right shoulder: Secondary | ICD-10-CM | POA: Diagnosis not present

## 2020-04-10 DIAGNOSIS — M2550 Pain in unspecified joint: Secondary | ICD-10-CM | POA: Diagnosis not present

## 2020-04-10 DIAGNOSIS — J449 Chronic obstructive pulmonary disease, unspecified: Secondary | ICD-10-CM | POA: Diagnosis not present

## 2020-04-10 DIAGNOSIS — E785 Hyperlipidemia, unspecified: Secondary | ICD-10-CM | POA: Diagnosis not present

## 2020-04-10 DIAGNOSIS — R739 Hyperglycemia, unspecified: Secondary | ICD-10-CM | POA: Diagnosis not present

## 2020-04-28 DIAGNOSIS — N390 Urinary tract infection, site not specified: Secondary | ICD-10-CM | POA: Diagnosis not present

## 2020-04-28 DIAGNOSIS — R3915 Urgency of urination: Secondary | ICD-10-CM | POA: Diagnosis not present

## 2020-05-15 DIAGNOSIS — Z885 Allergy status to narcotic agent status: Secondary | ICD-10-CM | POA: Diagnosis not present

## 2020-05-15 DIAGNOSIS — Z889 Allergy status to unspecified drugs, medicaments and biological substances status: Secondary | ICD-10-CM | POA: Diagnosis not present

## 2020-05-15 DIAGNOSIS — E041 Nontoxic single thyroid nodule: Secondary | ICD-10-CM | POA: Diagnosis not present

## 2020-05-15 DIAGNOSIS — Z9981 Dependence on supplemental oxygen: Secondary | ICD-10-CM | POA: Diagnosis not present

## 2020-05-15 DIAGNOSIS — J90 Pleural effusion, not elsewhere classified: Secondary | ICD-10-CM | POA: Diagnosis not present

## 2020-05-15 DIAGNOSIS — R0902 Hypoxemia: Secondary | ICD-10-CM | POA: Diagnosis not present

## 2020-05-15 DIAGNOSIS — Z882 Allergy status to sulfonamides status: Secondary | ICD-10-CM | POA: Diagnosis not present

## 2020-05-15 DIAGNOSIS — R404 Transient alteration of awareness: Secondary | ICD-10-CM | POA: Diagnosis not present

## 2020-05-15 DIAGNOSIS — I517 Cardiomegaly: Secondary | ICD-10-CM | POA: Diagnosis not present

## 2020-05-15 DIAGNOSIS — Z87442 Personal history of urinary calculi: Secondary | ICD-10-CM | POA: Diagnosis not present

## 2020-05-15 DIAGNOSIS — R0789 Other chest pain: Secondary | ICD-10-CM | POA: Diagnosis not present

## 2020-05-15 DIAGNOSIS — J9602 Acute respiratory failure with hypercapnia: Secondary | ICD-10-CM | POA: Diagnosis not present

## 2020-05-15 DIAGNOSIS — R6520 Severe sepsis without septic shock: Secondary | ICD-10-CM | POA: Diagnosis not present

## 2020-05-15 DIAGNOSIS — Z89612 Acquired absence of left leg above knee: Secondary | ICD-10-CM | POA: Diagnosis not present

## 2020-05-15 DIAGNOSIS — Z8673 Personal history of transient ischemic attack (TIA), and cerebral infarction without residual deficits: Secondary | ICD-10-CM | POA: Diagnosis not present

## 2020-05-15 DIAGNOSIS — G9340 Encephalopathy, unspecified: Secondary | ICD-10-CM | POA: Diagnosis not present

## 2020-05-15 DIAGNOSIS — G4733 Obstructive sleep apnea (adult) (pediatric): Secondary | ICD-10-CM | POA: Diagnosis not present

## 2020-05-15 DIAGNOSIS — E785 Hyperlipidemia, unspecified: Secondary | ICD-10-CM | POA: Diagnosis not present

## 2020-05-15 DIAGNOSIS — R0602 Shortness of breath: Secondary | ICD-10-CM | POA: Diagnosis not present

## 2020-05-15 DIAGNOSIS — J9621 Acute and chronic respiratory failure with hypoxia: Secondary | ICD-10-CM | POA: Diagnosis not present

## 2020-05-15 DIAGNOSIS — J9600 Acute respiratory failure, unspecified whether with hypoxia or hypercapnia: Secondary | ICD-10-CM | POA: Diagnosis not present

## 2020-05-15 DIAGNOSIS — I5032 Chronic diastolic (congestive) heart failure: Secondary | ICD-10-CM | POA: Diagnosis not present

## 2020-05-15 DIAGNOSIS — G9050 Complex regional pain syndrome I, unspecified: Secondary | ICD-10-CM | POA: Diagnosis not present

## 2020-05-15 DIAGNOSIS — R079 Chest pain, unspecified: Secondary | ICD-10-CM | POA: Diagnosis not present

## 2020-05-15 DIAGNOSIS — R0600 Dyspnea, unspecified: Secondary | ICD-10-CM | POA: Diagnosis not present

## 2020-05-15 DIAGNOSIS — K219 Gastro-esophageal reflux disease without esophagitis: Secondary | ICD-10-CM | POA: Diagnosis not present

## 2020-05-15 DIAGNOSIS — Z91040 Latex allergy status: Secondary | ICD-10-CM | POA: Diagnosis not present

## 2020-05-15 DIAGNOSIS — J9691 Respiratory failure, unspecified with hypoxia: Secondary | ICD-10-CM | POA: Diagnosis not present

## 2020-05-15 DIAGNOSIS — I110 Hypertensive heart disease with heart failure: Secondary | ICD-10-CM | POA: Diagnosis not present

## 2020-05-22 DIAGNOSIS — K219 Gastro-esophageal reflux disease without esophagitis: Secondary | ICD-10-CM | POA: Diagnosis not present

## 2020-05-22 DIAGNOSIS — Z9181 History of falling: Secondary | ICD-10-CM | POA: Diagnosis not present

## 2020-05-22 DIAGNOSIS — Z87891 Personal history of nicotine dependence: Secondary | ICD-10-CM | POA: Diagnosis not present

## 2020-05-22 DIAGNOSIS — J9621 Acute and chronic respiratory failure with hypoxia: Secondary | ICD-10-CM | POA: Diagnosis not present

## 2020-05-22 DIAGNOSIS — M1990 Unspecified osteoarthritis, unspecified site: Secondary | ICD-10-CM | POA: Diagnosis not present

## 2020-05-22 DIAGNOSIS — I110 Hypertensive heart disease with heart failure: Secondary | ICD-10-CM | POA: Diagnosis not present

## 2020-05-22 DIAGNOSIS — J9622 Acute and chronic respiratory failure with hypercapnia: Secondary | ICD-10-CM | POA: Diagnosis not present

## 2020-05-22 DIAGNOSIS — G4733 Obstructive sleep apnea (adult) (pediatric): Secondary | ICD-10-CM | POA: Diagnosis not present

## 2020-05-22 DIAGNOSIS — Z9981 Dependence on supplemental oxygen: Secondary | ICD-10-CM | POA: Diagnosis not present

## 2020-05-22 DIAGNOSIS — I452 Bifascicular block: Secondary | ICD-10-CM | POA: Diagnosis not present

## 2020-05-22 DIAGNOSIS — D649 Anemia, unspecified: Secondary | ICD-10-CM | POA: Diagnosis not present

## 2020-05-22 DIAGNOSIS — I5032 Chronic diastolic (congestive) heart failure: Secondary | ICD-10-CM | POA: Diagnosis not present

## 2020-05-22 DIAGNOSIS — N200 Calculus of kidney: Secondary | ICD-10-CM | POA: Diagnosis not present

## 2020-05-22 DIAGNOSIS — I69354 Hemiplegia and hemiparesis following cerebral infarction affecting left non-dominant side: Secondary | ICD-10-CM | POA: Diagnosis not present

## 2020-05-22 DIAGNOSIS — G9050 Complex regional pain syndrome I, unspecified: Secondary | ICD-10-CM | POA: Diagnosis not present

## 2020-05-22 DIAGNOSIS — R296 Repeated falls: Secondary | ICD-10-CM | POA: Diagnosis not present

## 2020-05-22 DIAGNOSIS — Z7951 Long term (current) use of inhaled steroids: Secondary | ICD-10-CM | POA: Diagnosis not present

## 2020-05-22 DIAGNOSIS — Z87440 Personal history of urinary (tract) infections: Secondary | ICD-10-CM | POA: Diagnosis not present

## 2020-05-22 DIAGNOSIS — Z89612 Acquired absence of left leg above knee: Secondary | ICD-10-CM | POA: Diagnosis not present

## 2020-05-22 DIAGNOSIS — J440 Chronic obstructive pulmonary disease with acute lower respiratory infection: Secondary | ICD-10-CM | POA: Diagnosis not present

## 2020-05-22 DIAGNOSIS — E785 Hyperlipidemia, unspecified: Secondary | ICD-10-CM | POA: Diagnosis not present

## 2020-05-27 DIAGNOSIS — J9622 Acute and chronic respiratory failure with hypercapnia: Secondary | ICD-10-CM | POA: Diagnosis not present

## 2020-05-27 DIAGNOSIS — G9050 Complex regional pain syndrome I, unspecified: Secondary | ICD-10-CM | POA: Diagnosis not present

## 2020-05-27 DIAGNOSIS — Z87440 Personal history of urinary (tract) infections: Secondary | ICD-10-CM | POA: Diagnosis not present

## 2020-05-27 DIAGNOSIS — Z9181 History of falling: Secondary | ICD-10-CM | POA: Diagnosis not present

## 2020-05-27 DIAGNOSIS — G4733 Obstructive sleep apnea (adult) (pediatric): Secondary | ICD-10-CM | POA: Diagnosis not present

## 2020-05-27 DIAGNOSIS — I110 Hypertensive heart disease with heart failure: Secondary | ICD-10-CM | POA: Diagnosis not present

## 2020-05-27 DIAGNOSIS — E785 Hyperlipidemia, unspecified: Secondary | ICD-10-CM | POA: Diagnosis not present

## 2020-05-27 DIAGNOSIS — J440 Chronic obstructive pulmonary disease with acute lower respiratory infection: Secondary | ICD-10-CM | POA: Diagnosis not present

## 2020-05-27 DIAGNOSIS — Z87891 Personal history of nicotine dependence: Secondary | ICD-10-CM | POA: Diagnosis not present

## 2020-05-27 DIAGNOSIS — K219 Gastro-esophageal reflux disease without esophagitis: Secondary | ICD-10-CM | POA: Diagnosis not present

## 2020-05-27 DIAGNOSIS — Z9981 Dependence on supplemental oxygen: Secondary | ICD-10-CM | POA: Diagnosis not present

## 2020-05-27 DIAGNOSIS — D649 Anemia, unspecified: Secondary | ICD-10-CM | POA: Diagnosis not present

## 2020-05-27 DIAGNOSIS — I452 Bifascicular block: Secondary | ICD-10-CM | POA: Diagnosis not present

## 2020-05-27 DIAGNOSIS — N200 Calculus of kidney: Secondary | ICD-10-CM | POA: Diagnosis not present

## 2020-05-27 DIAGNOSIS — M1990 Unspecified osteoarthritis, unspecified site: Secondary | ICD-10-CM | POA: Diagnosis not present

## 2020-05-27 DIAGNOSIS — R296 Repeated falls: Secondary | ICD-10-CM | POA: Diagnosis not present

## 2020-05-27 DIAGNOSIS — J9621 Acute and chronic respiratory failure with hypoxia: Secondary | ICD-10-CM | POA: Diagnosis not present

## 2020-05-27 DIAGNOSIS — I69354 Hemiplegia and hemiparesis following cerebral infarction affecting left non-dominant side: Secondary | ICD-10-CM | POA: Diagnosis not present

## 2020-05-27 DIAGNOSIS — Z89612 Acquired absence of left leg above knee: Secondary | ICD-10-CM | POA: Diagnosis not present

## 2020-05-27 DIAGNOSIS — I5032 Chronic diastolic (congestive) heart failure: Secondary | ICD-10-CM | POA: Diagnosis not present

## 2020-05-27 DIAGNOSIS — Z7951 Long term (current) use of inhaled steroids: Secondary | ICD-10-CM | POA: Diagnosis not present

## 2020-05-28 DIAGNOSIS — J9611 Chronic respiratory failure with hypoxia: Secondary | ICD-10-CM | POA: Diagnosis not present

## 2020-05-28 DIAGNOSIS — Z23 Encounter for immunization: Secondary | ICD-10-CM | POA: Diagnosis not present

## 2020-05-28 DIAGNOSIS — Z09 Encounter for follow-up examination after completed treatment for conditions other than malignant neoplasm: Secondary | ICD-10-CM | POA: Diagnosis not present

## 2020-05-30 DIAGNOSIS — K219 Gastro-esophageal reflux disease without esophagitis: Secondary | ICD-10-CM | POA: Diagnosis not present

## 2020-05-30 DIAGNOSIS — Z9981 Dependence on supplemental oxygen: Secondary | ICD-10-CM | POA: Diagnosis not present

## 2020-05-30 DIAGNOSIS — I69354 Hemiplegia and hemiparesis following cerebral infarction affecting left non-dominant side: Secondary | ICD-10-CM | POA: Diagnosis not present

## 2020-05-30 DIAGNOSIS — Z87891 Personal history of nicotine dependence: Secondary | ICD-10-CM | POA: Diagnosis not present

## 2020-05-30 DIAGNOSIS — I5032 Chronic diastolic (congestive) heart failure: Secondary | ICD-10-CM | POA: Diagnosis not present

## 2020-05-30 DIAGNOSIS — J449 Chronic obstructive pulmonary disease, unspecified: Secondary | ICD-10-CM | POA: Diagnosis not present

## 2020-05-30 DIAGNOSIS — G9050 Complex regional pain syndrome I, unspecified: Secondary | ICD-10-CM | POA: Diagnosis not present

## 2020-05-30 DIAGNOSIS — J440 Chronic obstructive pulmonary disease with acute lower respiratory infection: Secondary | ICD-10-CM | POA: Diagnosis not present

## 2020-05-30 DIAGNOSIS — Z89612 Acquired absence of left leg above knee: Secondary | ICD-10-CM | POA: Diagnosis not present

## 2020-05-30 DIAGNOSIS — Z9181 History of falling: Secondary | ICD-10-CM | POA: Diagnosis not present

## 2020-05-30 DIAGNOSIS — I452 Bifascicular block: Secondary | ICD-10-CM | POA: Diagnosis not present

## 2020-05-30 DIAGNOSIS — G4733 Obstructive sleep apnea (adult) (pediatric): Secondary | ICD-10-CM | POA: Diagnosis not present

## 2020-05-30 DIAGNOSIS — R296 Repeated falls: Secondary | ICD-10-CM | POA: Diagnosis not present

## 2020-05-30 DIAGNOSIS — Z7951 Long term (current) use of inhaled steroids: Secondary | ICD-10-CM | POA: Diagnosis not present

## 2020-05-30 DIAGNOSIS — J9621 Acute and chronic respiratory failure with hypoxia: Secondary | ICD-10-CM | POA: Diagnosis not present

## 2020-05-30 DIAGNOSIS — J441 Chronic obstructive pulmonary disease with (acute) exacerbation: Secondary | ICD-10-CM | POA: Diagnosis not present

## 2020-05-30 DIAGNOSIS — I110 Hypertensive heart disease with heart failure: Secondary | ICD-10-CM | POA: Diagnosis not present

## 2020-05-30 DIAGNOSIS — E785 Hyperlipidemia, unspecified: Secondary | ICD-10-CM | POA: Diagnosis not present

## 2020-05-30 DIAGNOSIS — D649 Anemia, unspecified: Secondary | ICD-10-CM | POA: Diagnosis not present

## 2020-05-30 DIAGNOSIS — Z87440 Personal history of urinary (tract) infections: Secondary | ICD-10-CM | POA: Diagnosis not present

## 2020-05-30 DIAGNOSIS — N200 Calculus of kidney: Secondary | ICD-10-CM | POA: Diagnosis not present

## 2020-05-30 DIAGNOSIS — M1990 Unspecified osteoarthritis, unspecified site: Secondary | ICD-10-CM | POA: Diagnosis not present

## 2020-06-01 DIAGNOSIS — J4541 Moderate persistent asthma with (acute) exacerbation: Secondary | ICD-10-CM | POA: Diagnosis not present

## 2020-06-03 DIAGNOSIS — G9050 Complex regional pain syndrome I, unspecified: Secondary | ICD-10-CM | POA: Diagnosis not present

## 2020-06-03 DIAGNOSIS — G4733 Obstructive sleep apnea (adult) (pediatric): Secondary | ICD-10-CM | POA: Diagnosis not present

## 2020-06-03 DIAGNOSIS — Z9181 History of falling: Secondary | ICD-10-CM | POA: Diagnosis not present

## 2020-06-03 DIAGNOSIS — J440 Chronic obstructive pulmonary disease with acute lower respiratory infection: Secondary | ICD-10-CM | POA: Diagnosis not present

## 2020-06-03 DIAGNOSIS — I5032 Chronic diastolic (congestive) heart failure: Secondary | ICD-10-CM | POA: Diagnosis not present

## 2020-06-03 DIAGNOSIS — I110 Hypertensive heart disease with heart failure: Secondary | ICD-10-CM | POA: Diagnosis not present

## 2020-06-03 DIAGNOSIS — D649 Anemia, unspecified: Secondary | ICD-10-CM | POA: Diagnosis not present

## 2020-06-03 DIAGNOSIS — Z7951 Long term (current) use of inhaled steroids: Secondary | ICD-10-CM | POA: Diagnosis not present

## 2020-06-03 DIAGNOSIS — R296 Repeated falls: Secondary | ICD-10-CM | POA: Diagnosis not present

## 2020-06-03 DIAGNOSIS — K219 Gastro-esophageal reflux disease without esophagitis: Secondary | ICD-10-CM | POA: Diagnosis not present

## 2020-06-03 DIAGNOSIS — I452 Bifascicular block: Secondary | ICD-10-CM | POA: Diagnosis not present

## 2020-06-03 DIAGNOSIS — J9621 Acute and chronic respiratory failure with hypoxia: Secondary | ICD-10-CM | POA: Diagnosis not present

## 2020-06-03 DIAGNOSIS — I69354 Hemiplegia and hemiparesis following cerebral infarction affecting left non-dominant side: Secondary | ICD-10-CM | POA: Diagnosis not present

## 2020-06-03 DIAGNOSIS — J441 Chronic obstructive pulmonary disease with (acute) exacerbation: Secondary | ICD-10-CM | POA: Diagnosis not present

## 2020-06-03 DIAGNOSIS — Z9981 Dependence on supplemental oxygen: Secondary | ICD-10-CM | POA: Diagnosis not present

## 2020-06-03 DIAGNOSIS — Z87891 Personal history of nicotine dependence: Secondary | ICD-10-CM | POA: Diagnosis not present

## 2020-06-03 DIAGNOSIS — Z89612 Acquired absence of left leg above knee: Secondary | ICD-10-CM | POA: Diagnosis not present

## 2020-06-03 DIAGNOSIS — E785 Hyperlipidemia, unspecified: Secondary | ICD-10-CM | POA: Diagnosis not present

## 2020-06-03 DIAGNOSIS — M1990 Unspecified osteoarthritis, unspecified site: Secondary | ICD-10-CM | POA: Diagnosis not present

## 2020-06-03 DIAGNOSIS — N200 Calculus of kidney: Secondary | ICD-10-CM | POA: Diagnosis not present

## 2020-06-03 DIAGNOSIS — Z87440 Personal history of urinary (tract) infections: Secondary | ICD-10-CM | POA: Diagnosis not present

## 2020-06-04 DIAGNOSIS — Z881 Allergy status to other antibiotic agents status: Secondary | ICD-10-CM | POA: Diagnosis not present

## 2020-06-04 DIAGNOSIS — Z880 Allergy status to penicillin: Secondary | ICD-10-CM | POA: Diagnosis not present

## 2020-06-04 DIAGNOSIS — Z743 Need for continuous supervision: Secondary | ICD-10-CM | POA: Diagnosis not present

## 2020-06-04 DIAGNOSIS — F17210 Nicotine dependence, cigarettes, uncomplicated: Secondary | ICD-10-CM | POA: Diagnosis not present

## 2020-06-04 DIAGNOSIS — Z9981 Dependence on supplemental oxygen: Secondary | ICD-10-CM | POA: Diagnosis not present

## 2020-06-04 DIAGNOSIS — Z885 Allergy status to narcotic agent status: Secondary | ICD-10-CM | POA: Diagnosis not present

## 2020-06-04 DIAGNOSIS — G4733 Obstructive sleep apnea (adult) (pediatric): Secondary | ICD-10-CM | POA: Diagnosis not present

## 2020-06-04 DIAGNOSIS — Z8719 Personal history of other diseases of the digestive system: Secondary | ICD-10-CM | POA: Diagnosis not present

## 2020-06-04 DIAGNOSIS — Z79899 Other long term (current) drug therapy: Secondary | ICD-10-CM | POA: Diagnosis not present

## 2020-06-04 DIAGNOSIS — Z23 Encounter for immunization: Secondary | ICD-10-CM | POA: Diagnosis not present

## 2020-06-04 DIAGNOSIS — K5900 Constipation, unspecified: Secondary | ICD-10-CM | POA: Diagnosis not present

## 2020-06-04 DIAGNOSIS — J9811 Atelectasis: Secondary | ICD-10-CM | POA: Diagnosis not present

## 2020-06-04 DIAGNOSIS — R0602 Shortness of breath: Secondary | ICD-10-CM | POA: Diagnosis not present

## 2020-06-04 DIAGNOSIS — J441 Chronic obstructive pulmonary disease with (acute) exacerbation: Secondary | ICD-10-CM | POA: Diagnosis not present

## 2020-06-04 DIAGNOSIS — Z8673 Personal history of transient ischemic attack (TIA), and cerebral infarction without residual deficits: Secondary | ICD-10-CM | POA: Diagnosis not present

## 2020-06-04 DIAGNOSIS — K219 Gastro-esophageal reflux disease without esophagitis: Secondary | ICD-10-CM | POA: Diagnosis not present

## 2020-06-04 DIAGNOSIS — Z993 Dependence on wheelchair: Secondary | ICD-10-CM | POA: Diagnosis not present

## 2020-06-04 DIAGNOSIS — I5032 Chronic diastolic (congestive) heart failure: Secondary | ICD-10-CM | POA: Diagnosis not present

## 2020-06-04 DIAGNOSIS — J9602 Acute respiratory failure with hypercapnia: Secondary | ICD-10-CM | POA: Diagnosis not present

## 2020-06-04 DIAGNOSIS — Z882 Allergy status to sulfonamides status: Secondary | ICD-10-CM | POA: Diagnosis not present

## 2020-06-04 DIAGNOSIS — J9601 Acute respiratory failure with hypoxia: Secondary | ICD-10-CM | POA: Diagnosis not present

## 2020-06-04 DIAGNOSIS — G9050 Complex regional pain syndrome I, unspecified: Secondary | ICD-10-CM | POA: Diagnosis not present

## 2020-06-04 DIAGNOSIS — E785 Hyperlipidemia, unspecified: Secondary | ICD-10-CM | POA: Diagnosis not present

## 2020-06-04 DIAGNOSIS — Z888 Allergy status to other drugs, medicaments and biological substances status: Secondary | ICD-10-CM | POA: Diagnosis not present

## 2020-06-04 DIAGNOSIS — Z89612 Acquired absence of left leg above knee: Secondary | ICD-10-CM | POA: Diagnosis not present

## 2020-06-08 DIAGNOSIS — Z9181 History of falling: Secondary | ICD-10-CM | POA: Diagnosis not present

## 2020-06-08 DIAGNOSIS — I5032 Chronic diastolic (congestive) heart failure: Secondary | ICD-10-CM | POA: Diagnosis not present

## 2020-06-08 DIAGNOSIS — K219 Gastro-esophageal reflux disease without esophagitis: Secondary | ICD-10-CM | POA: Diagnosis not present

## 2020-06-08 DIAGNOSIS — G4733 Obstructive sleep apnea (adult) (pediatric): Secondary | ICD-10-CM | POA: Diagnosis not present

## 2020-06-08 DIAGNOSIS — Z89612 Acquired absence of left leg above knee: Secondary | ICD-10-CM | POA: Diagnosis not present

## 2020-06-08 DIAGNOSIS — J440 Chronic obstructive pulmonary disease with acute lower respiratory infection: Secondary | ICD-10-CM | POA: Diagnosis not present

## 2020-06-08 DIAGNOSIS — R296 Repeated falls: Secondary | ICD-10-CM | POA: Diagnosis not present

## 2020-06-08 DIAGNOSIS — Z7951 Long term (current) use of inhaled steroids: Secondary | ICD-10-CM | POA: Diagnosis not present

## 2020-06-08 DIAGNOSIS — I452 Bifascicular block: Secondary | ICD-10-CM | POA: Diagnosis not present

## 2020-06-08 DIAGNOSIS — G9050 Complex regional pain syndrome I, unspecified: Secondary | ICD-10-CM | POA: Diagnosis not present

## 2020-06-08 DIAGNOSIS — E785 Hyperlipidemia, unspecified: Secondary | ICD-10-CM | POA: Diagnosis not present

## 2020-06-08 DIAGNOSIS — I110 Hypertensive heart disease with heart failure: Secondary | ICD-10-CM | POA: Diagnosis not present

## 2020-06-08 DIAGNOSIS — M1990 Unspecified osteoarthritis, unspecified site: Secondary | ICD-10-CM | POA: Diagnosis not present

## 2020-06-08 DIAGNOSIS — J9621 Acute and chronic respiratory failure with hypoxia: Secondary | ICD-10-CM | POA: Diagnosis not present

## 2020-06-08 DIAGNOSIS — D649 Anemia, unspecified: Secondary | ICD-10-CM | POA: Diagnosis not present

## 2020-06-08 DIAGNOSIS — I69354 Hemiplegia and hemiparesis following cerebral infarction affecting left non-dominant side: Secondary | ICD-10-CM | POA: Diagnosis not present

## 2020-06-08 DIAGNOSIS — N200 Calculus of kidney: Secondary | ICD-10-CM | POA: Diagnosis not present

## 2020-06-08 DIAGNOSIS — J441 Chronic obstructive pulmonary disease with (acute) exacerbation: Secondary | ICD-10-CM | POA: Diagnosis not present

## 2020-06-08 DIAGNOSIS — Z9981 Dependence on supplemental oxygen: Secondary | ICD-10-CM | POA: Diagnosis not present

## 2020-06-08 DIAGNOSIS — Z87891 Personal history of nicotine dependence: Secondary | ICD-10-CM | POA: Diagnosis not present

## 2020-06-08 DIAGNOSIS — Z87440 Personal history of urinary (tract) infections: Secondary | ICD-10-CM | POA: Diagnosis not present

## 2020-06-10 DIAGNOSIS — R296 Repeated falls: Secondary | ICD-10-CM | POA: Diagnosis not present

## 2020-06-10 DIAGNOSIS — N200 Calculus of kidney: Secondary | ICD-10-CM | POA: Diagnosis not present

## 2020-06-10 DIAGNOSIS — Z87440 Personal history of urinary (tract) infections: Secondary | ICD-10-CM | POA: Diagnosis not present

## 2020-06-10 DIAGNOSIS — K219 Gastro-esophageal reflux disease without esophagitis: Secondary | ICD-10-CM | POA: Diagnosis not present

## 2020-06-10 DIAGNOSIS — J9611 Chronic respiratory failure with hypoxia: Secondary | ICD-10-CM | POA: Diagnosis not present

## 2020-06-10 DIAGNOSIS — R739 Hyperglycemia, unspecified: Secondary | ICD-10-CM | POA: Diagnosis not present

## 2020-06-10 DIAGNOSIS — G4733 Obstructive sleep apnea (adult) (pediatric): Secondary | ICD-10-CM | POA: Diagnosis not present

## 2020-06-10 DIAGNOSIS — I452 Bifascicular block: Secondary | ICD-10-CM | POA: Diagnosis not present

## 2020-06-10 DIAGNOSIS — Z79899 Other long term (current) drug therapy: Secondary | ICD-10-CM | POA: Diagnosis not present

## 2020-06-10 DIAGNOSIS — J441 Chronic obstructive pulmonary disease with (acute) exacerbation: Secondary | ICD-10-CM | POA: Diagnosis not present

## 2020-06-10 DIAGNOSIS — J9621 Acute and chronic respiratory failure with hypoxia: Secondary | ICD-10-CM | POA: Diagnosis not present

## 2020-06-10 DIAGNOSIS — Z7951 Long term (current) use of inhaled steroids: Secondary | ICD-10-CM | POA: Diagnosis not present

## 2020-06-10 DIAGNOSIS — J449 Chronic obstructive pulmonary disease, unspecified: Secondary | ICD-10-CM | POA: Diagnosis not present

## 2020-06-10 DIAGNOSIS — G9050 Complex regional pain syndrome I, unspecified: Secondary | ICD-10-CM | POA: Diagnosis not present

## 2020-06-10 DIAGNOSIS — I69354 Hemiplegia and hemiparesis following cerebral infarction affecting left non-dominant side: Secondary | ICD-10-CM | POA: Diagnosis not present

## 2020-06-10 DIAGNOSIS — D649 Anemia, unspecified: Secondary | ICD-10-CM | POA: Diagnosis not present

## 2020-06-10 DIAGNOSIS — Z87891 Personal history of nicotine dependence: Secondary | ICD-10-CM | POA: Diagnosis not present

## 2020-06-10 DIAGNOSIS — I110 Hypertensive heart disease with heart failure: Secondary | ICD-10-CM | POA: Diagnosis not present

## 2020-06-10 DIAGNOSIS — Z9181 History of falling: Secondary | ICD-10-CM | POA: Diagnosis not present

## 2020-06-10 DIAGNOSIS — E785 Hyperlipidemia, unspecified: Secondary | ICD-10-CM | POA: Diagnosis not present

## 2020-06-10 DIAGNOSIS — I5032 Chronic diastolic (congestive) heart failure: Secondary | ICD-10-CM | POA: Diagnosis not present

## 2020-06-10 DIAGNOSIS — Z89612 Acquired absence of left leg above knee: Secondary | ICD-10-CM | POA: Diagnosis not present

## 2020-06-10 DIAGNOSIS — Z9981 Dependence on supplemental oxygen: Secondary | ICD-10-CM | POA: Diagnosis not present

## 2020-06-10 DIAGNOSIS — J440 Chronic obstructive pulmonary disease with acute lower respiratory infection: Secondary | ICD-10-CM | POA: Diagnosis not present

## 2020-06-10 DIAGNOSIS — M1990 Unspecified osteoarthritis, unspecified site: Secondary | ICD-10-CM | POA: Diagnosis not present

## 2020-06-13 DIAGNOSIS — I452 Bifascicular block: Secondary | ICD-10-CM | POA: Diagnosis not present

## 2020-06-13 DIAGNOSIS — J441 Chronic obstructive pulmonary disease with (acute) exacerbation: Secondary | ICD-10-CM | POA: Diagnosis not present

## 2020-06-13 DIAGNOSIS — Z9181 History of falling: Secondary | ICD-10-CM | POA: Diagnosis not present

## 2020-06-13 DIAGNOSIS — G9050 Complex regional pain syndrome I, unspecified: Secondary | ICD-10-CM | POA: Diagnosis not present

## 2020-06-13 DIAGNOSIS — G4733 Obstructive sleep apnea (adult) (pediatric): Secondary | ICD-10-CM | POA: Diagnosis not present

## 2020-06-13 DIAGNOSIS — I5032 Chronic diastolic (congestive) heart failure: Secondary | ICD-10-CM | POA: Diagnosis not present

## 2020-06-13 DIAGNOSIS — J9621 Acute and chronic respiratory failure with hypoxia: Secondary | ICD-10-CM | POA: Diagnosis not present

## 2020-06-13 DIAGNOSIS — Z87440 Personal history of urinary (tract) infections: Secondary | ICD-10-CM | POA: Diagnosis not present

## 2020-06-13 DIAGNOSIS — J440 Chronic obstructive pulmonary disease with acute lower respiratory infection: Secondary | ICD-10-CM | POA: Diagnosis not present

## 2020-06-13 DIAGNOSIS — I69354 Hemiplegia and hemiparesis following cerebral infarction affecting left non-dominant side: Secondary | ICD-10-CM | POA: Diagnosis not present

## 2020-06-13 DIAGNOSIS — K219 Gastro-esophageal reflux disease without esophagitis: Secondary | ICD-10-CM | POA: Diagnosis not present

## 2020-06-13 DIAGNOSIS — M1990 Unspecified osteoarthritis, unspecified site: Secondary | ICD-10-CM | POA: Diagnosis not present

## 2020-06-13 DIAGNOSIS — Z89612 Acquired absence of left leg above knee: Secondary | ICD-10-CM | POA: Diagnosis not present

## 2020-06-13 DIAGNOSIS — Z7951 Long term (current) use of inhaled steroids: Secondary | ICD-10-CM | POA: Diagnosis not present

## 2020-06-13 DIAGNOSIS — Z9981 Dependence on supplemental oxygen: Secondary | ICD-10-CM | POA: Diagnosis not present

## 2020-06-13 DIAGNOSIS — I110 Hypertensive heart disease with heart failure: Secondary | ICD-10-CM | POA: Diagnosis not present

## 2020-06-13 DIAGNOSIS — N200 Calculus of kidney: Secondary | ICD-10-CM | POA: Diagnosis not present

## 2020-06-13 DIAGNOSIS — D649 Anemia, unspecified: Secondary | ICD-10-CM | POA: Diagnosis not present

## 2020-06-13 DIAGNOSIS — E785 Hyperlipidemia, unspecified: Secondary | ICD-10-CM | POA: Diagnosis not present

## 2020-06-13 DIAGNOSIS — R296 Repeated falls: Secondary | ICD-10-CM | POA: Diagnosis not present

## 2020-06-13 DIAGNOSIS — Z87891 Personal history of nicotine dependence: Secondary | ICD-10-CM | POA: Diagnosis not present

## 2020-06-18 DIAGNOSIS — M1990 Unspecified osteoarthritis, unspecified site: Secondary | ICD-10-CM | POA: Diagnosis not present

## 2020-06-18 DIAGNOSIS — I110 Hypertensive heart disease with heart failure: Secondary | ICD-10-CM | POA: Diagnosis not present

## 2020-06-18 DIAGNOSIS — Z87440 Personal history of urinary (tract) infections: Secondary | ICD-10-CM | POA: Diagnosis not present

## 2020-06-18 DIAGNOSIS — I452 Bifascicular block: Secondary | ICD-10-CM | POA: Diagnosis not present

## 2020-06-18 DIAGNOSIS — I5032 Chronic diastolic (congestive) heart failure: Secondary | ICD-10-CM | POA: Diagnosis not present

## 2020-06-18 DIAGNOSIS — J440 Chronic obstructive pulmonary disease with acute lower respiratory infection: Secondary | ICD-10-CM | POA: Diagnosis not present

## 2020-06-18 DIAGNOSIS — Z9981 Dependence on supplemental oxygen: Secondary | ICD-10-CM | POA: Diagnosis not present

## 2020-06-18 DIAGNOSIS — Z7951 Long term (current) use of inhaled steroids: Secondary | ICD-10-CM | POA: Diagnosis not present

## 2020-06-18 DIAGNOSIS — R296 Repeated falls: Secondary | ICD-10-CM | POA: Diagnosis not present

## 2020-06-18 DIAGNOSIS — I69354 Hemiplegia and hemiparesis following cerebral infarction affecting left non-dominant side: Secondary | ICD-10-CM | POA: Diagnosis not present

## 2020-06-18 DIAGNOSIS — K219 Gastro-esophageal reflux disease without esophagitis: Secondary | ICD-10-CM | POA: Diagnosis not present

## 2020-06-18 DIAGNOSIS — D649 Anemia, unspecified: Secondary | ICD-10-CM | POA: Diagnosis not present

## 2020-06-18 DIAGNOSIS — G9050 Complex regional pain syndrome I, unspecified: Secondary | ICD-10-CM | POA: Diagnosis not present

## 2020-06-18 DIAGNOSIS — N200 Calculus of kidney: Secondary | ICD-10-CM | POA: Diagnosis not present

## 2020-06-18 DIAGNOSIS — Z89612 Acquired absence of left leg above knee: Secondary | ICD-10-CM | POA: Diagnosis not present

## 2020-06-18 DIAGNOSIS — Z87891 Personal history of nicotine dependence: Secondary | ICD-10-CM | POA: Diagnosis not present

## 2020-06-18 DIAGNOSIS — E785 Hyperlipidemia, unspecified: Secondary | ICD-10-CM | POA: Diagnosis not present

## 2020-06-18 DIAGNOSIS — J9621 Acute and chronic respiratory failure with hypoxia: Secondary | ICD-10-CM | POA: Diagnosis not present

## 2020-06-18 DIAGNOSIS — J441 Chronic obstructive pulmonary disease with (acute) exacerbation: Secondary | ICD-10-CM | POA: Diagnosis not present

## 2020-06-18 DIAGNOSIS — Z9181 History of falling: Secondary | ICD-10-CM | POA: Diagnosis not present

## 2020-06-18 DIAGNOSIS — G4733 Obstructive sleep apnea (adult) (pediatric): Secondary | ICD-10-CM | POA: Diagnosis not present

## 2020-06-20 DIAGNOSIS — J9621 Acute and chronic respiratory failure with hypoxia: Secondary | ICD-10-CM | POA: Diagnosis not present

## 2020-06-20 DIAGNOSIS — Z9981 Dependence on supplemental oxygen: Secondary | ICD-10-CM | POA: Diagnosis not present

## 2020-06-20 DIAGNOSIS — I69354 Hemiplegia and hemiparesis following cerebral infarction affecting left non-dominant side: Secondary | ICD-10-CM | POA: Diagnosis not present

## 2020-06-20 DIAGNOSIS — Z9181 History of falling: Secondary | ICD-10-CM | POA: Diagnosis not present

## 2020-06-20 DIAGNOSIS — I110 Hypertensive heart disease with heart failure: Secondary | ICD-10-CM | POA: Diagnosis not present

## 2020-06-20 DIAGNOSIS — I5032 Chronic diastolic (congestive) heart failure: Secondary | ICD-10-CM | POA: Diagnosis not present

## 2020-06-20 DIAGNOSIS — Z87440 Personal history of urinary (tract) infections: Secondary | ICD-10-CM | POA: Diagnosis not present

## 2020-06-20 DIAGNOSIS — Z7951 Long term (current) use of inhaled steroids: Secondary | ICD-10-CM | POA: Diagnosis not present

## 2020-06-20 DIAGNOSIS — G4733 Obstructive sleep apnea (adult) (pediatric): Secondary | ICD-10-CM | POA: Diagnosis not present

## 2020-06-20 DIAGNOSIS — K219 Gastro-esophageal reflux disease without esophagitis: Secondary | ICD-10-CM | POA: Diagnosis not present

## 2020-06-20 DIAGNOSIS — G9050 Complex regional pain syndrome I, unspecified: Secondary | ICD-10-CM | POA: Diagnosis not present

## 2020-06-20 DIAGNOSIS — N200 Calculus of kidney: Secondary | ICD-10-CM | POA: Diagnosis not present

## 2020-06-20 DIAGNOSIS — I452 Bifascicular block: Secondary | ICD-10-CM | POA: Diagnosis not present

## 2020-06-20 DIAGNOSIS — M1990 Unspecified osteoarthritis, unspecified site: Secondary | ICD-10-CM | POA: Diagnosis not present

## 2020-06-20 DIAGNOSIS — Z89612 Acquired absence of left leg above knee: Secondary | ICD-10-CM | POA: Diagnosis not present

## 2020-06-20 DIAGNOSIS — E785 Hyperlipidemia, unspecified: Secondary | ICD-10-CM | POA: Diagnosis not present

## 2020-06-20 DIAGNOSIS — J440 Chronic obstructive pulmonary disease with acute lower respiratory infection: Secondary | ICD-10-CM | POA: Diagnosis not present

## 2020-06-20 DIAGNOSIS — D649 Anemia, unspecified: Secondary | ICD-10-CM | POA: Diagnosis not present

## 2020-06-20 DIAGNOSIS — R296 Repeated falls: Secondary | ICD-10-CM | POA: Diagnosis not present

## 2020-06-20 DIAGNOSIS — J441 Chronic obstructive pulmonary disease with (acute) exacerbation: Secondary | ICD-10-CM | POA: Diagnosis not present

## 2020-06-20 DIAGNOSIS — Z87891 Personal history of nicotine dependence: Secondary | ICD-10-CM | POA: Diagnosis not present

## 2020-06-27 DIAGNOSIS — Z9181 History of falling: Secondary | ICD-10-CM | POA: Diagnosis not present

## 2020-06-27 DIAGNOSIS — N200 Calculus of kidney: Secondary | ICD-10-CM | POA: Diagnosis not present

## 2020-06-27 DIAGNOSIS — Z7951 Long term (current) use of inhaled steroids: Secondary | ICD-10-CM | POA: Diagnosis not present

## 2020-06-27 DIAGNOSIS — J440 Chronic obstructive pulmonary disease with acute lower respiratory infection: Secondary | ICD-10-CM | POA: Diagnosis not present

## 2020-06-27 DIAGNOSIS — J441 Chronic obstructive pulmonary disease with (acute) exacerbation: Secondary | ICD-10-CM | POA: Diagnosis not present

## 2020-06-27 DIAGNOSIS — E785 Hyperlipidemia, unspecified: Secondary | ICD-10-CM | POA: Diagnosis not present

## 2020-06-27 DIAGNOSIS — Z9981 Dependence on supplemental oxygen: Secondary | ICD-10-CM | POA: Diagnosis not present

## 2020-06-27 DIAGNOSIS — D649 Anemia, unspecified: Secondary | ICD-10-CM | POA: Diagnosis not present

## 2020-06-27 DIAGNOSIS — I452 Bifascicular block: Secondary | ICD-10-CM | POA: Diagnosis not present

## 2020-06-27 DIAGNOSIS — I110 Hypertensive heart disease with heart failure: Secondary | ICD-10-CM | POA: Diagnosis not present

## 2020-06-27 DIAGNOSIS — I5032 Chronic diastolic (congestive) heart failure: Secondary | ICD-10-CM | POA: Diagnosis not present

## 2020-06-27 DIAGNOSIS — Z87440 Personal history of urinary (tract) infections: Secondary | ICD-10-CM | POA: Diagnosis not present

## 2020-06-27 DIAGNOSIS — R296 Repeated falls: Secondary | ICD-10-CM | POA: Diagnosis not present

## 2020-06-27 DIAGNOSIS — G9050 Complex regional pain syndrome I, unspecified: Secondary | ICD-10-CM | POA: Diagnosis not present

## 2020-06-27 DIAGNOSIS — M1990 Unspecified osteoarthritis, unspecified site: Secondary | ICD-10-CM | POA: Diagnosis not present

## 2020-06-27 DIAGNOSIS — K219 Gastro-esophageal reflux disease without esophagitis: Secondary | ICD-10-CM | POA: Diagnosis not present

## 2020-06-27 DIAGNOSIS — Z87891 Personal history of nicotine dependence: Secondary | ICD-10-CM | POA: Diagnosis not present

## 2020-06-27 DIAGNOSIS — J9621 Acute and chronic respiratory failure with hypoxia: Secondary | ICD-10-CM | POA: Diagnosis not present

## 2020-06-27 DIAGNOSIS — I69354 Hemiplegia and hemiparesis following cerebral infarction affecting left non-dominant side: Secondary | ICD-10-CM | POA: Diagnosis not present

## 2020-06-27 DIAGNOSIS — Z89612 Acquired absence of left leg above knee: Secondary | ICD-10-CM | POA: Diagnosis not present

## 2020-06-27 DIAGNOSIS — G4733 Obstructive sleep apnea (adult) (pediatric): Secondary | ICD-10-CM | POA: Diagnosis not present

## 2020-06-30 DIAGNOSIS — J449 Chronic obstructive pulmonary disease, unspecified: Secondary | ICD-10-CM | POA: Diagnosis not present

## 2020-07-02 DIAGNOSIS — Z9181 History of falling: Secondary | ICD-10-CM | POA: Diagnosis not present

## 2020-07-02 DIAGNOSIS — K219 Gastro-esophageal reflux disease without esophagitis: Secondary | ICD-10-CM | POA: Diagnosis not present

## 2020-07-02 DIAGNOSIS — Z87891 Personal history of nicotine dependence: Secondary | ICD-10-CM | POA: Diagnosis not present

## 2020-07-02 DIAGNOSIS — Z87440 Personal history of urinary (tract) infections: Secondary | ICD-10-CM | POA: Diagnosis not present

## 2020-07-02 DIAGNOSIS — Z9981 Dependence on supplemental oxygen: Secondary | ICD-10-CM | POA: Diagnosis not present

## 2020-07-02 DIAGNOSIS — G9050 Complex regional pain syndrome I, unspecified: Secondary | ICD-10-CM | POA: Diagnosis not present

## 2020-07-02 DIAGNOSIS — R296 Repeated falls: Secondary | ICD-10-CM | POA: Diagnosis not present

## 2020-07-02 DIAGNOSIS — Z791 Long term (current) use of non-steroidal anti-inflammatories (NSAID): Secondary | ICD-10-CM | POA: Diagnosis not present

## 2020-07-02 DIAGNOSIS — M1711 Unilateral primary osteoarthritis, right knee: Secondary | ICD-10-CM | POA: Diagnosis not present

## 2020-07-02 DIAGNOSIS — N200 Calculus of kidney: Secondary | ICD-10-CM | POA: Diagnosis not present

## 2020-07-02 DIAGNOSIS — I110 Hypertensive heart disease with heart failure: Secondary | ICD-10-CM | POA: Diagnosis not present

## 2020-07-02 DIAGNOSIS — E785 Hyperlipidemia, unspecified: Secondary | ICD-10-CM | POA: Diagnosis not present

## 2020-07-02 DIAGNOSIS — G4733 Obstructive sleep apnea (adult) (pediatric): Secondary | ICD-10-CM | POA: Diagnosis not present

## 2020-07-02 DIAGNOSIS — J9621 Acute and chronic respiratory failure with hypoxia: Secondary | ICD-10-CM | POA: Diagnosis not present

## 2020-07-02 DIAGNOSIS — K5900 Constipation, unspecified: Secondary | ICD-10-CM | POA: Diagnosis not present

## 2020-07-02 DIAGNOSIS — I69354 Hemiplegia and hemiparesis following cerebral infarction affecting left non-dominant side: Secondary | ICD-10-CM | POA: Diagnosis not present

## 2020-07-02 DIAGNOSIS — D649 Anemia, unspecified: Secondary | ICD-10-CM | POA: Diagnosis not present

## 2020-07-02 DIAGNOSIS — Z89612 Acquired absence of left leg above knee: Secondary | ICD-10-CM | POA: Diagnosis not present

## 2020-07-02 DIAGNOSIS — I452 Bifascicular block: Secondary | ICD-10-CM | POA: Diagnosis not present

## 2020-07-02 DIAGNOSIS — I5032 Chronic diastolic (congestive) heart failure: Secondary | ICD-10-CM | POA: Diagnosis not present

## 2020-07-02 DIAGNOSIS — J441 Chronic obstructive pulmonary disease with (acute) exacerbation: Secondary | ICD-10-CM | POA: Diagnosis not present

## 2020-07-02 DIAGNOSIS — Z7951 Long term (current) use of inhaled steroids: Secondary | ICD-10-CM | POA: Diagnosis not present

## 2020-07-11 DIAGNOSIS — I452 Bifascicular block: Secondary | ICD-10-CM | POA: Diagnosis not present

## 2020-07-11 DIAGNOSIS — D649 Anemia, unspecified: Secondary | ICD-10-CM | POA: Diagnosis not present

## 2020-07-11 DIAGNOSIS — M1711 Unilateral primary osteoarthritis, right knee: Secondary | ICD-10-CM | POA: Diagnosis not present

## 2020-07-11 DIAGNOSIS — Z87440 Personal history of urinary (tract) infections: Secondary | ICD-10-CM | POA: Diagnosis not present

## 2020-07-11 DIAGNOSIS — Z87891 Personal history of nicotine dependence: Secondary | ICD-10-CM | POA: Diagnosis not present

## 2020-07-11 DIAGNOSIS — Z791 Long term (current) use of non-steroidal anti-inflammatories (NSAID): Secondary | ICD-10-CM | POA: Diagnosis not present

## 2020-07-11 DIAGNOSIS — K219 Gastro-esophageal reflux disease without esophagitis: Secondary | ICD-10-CM | POA: Diagnosis not present

## 2020-07-11 DIAGNOSIS — Z7951 Long term (current) use of inhaled steroids: Secondary | ICD-10-CM | POA: Diagnosis not present

## 2020-07-11 DIAGNOSIS — N200 Calculus of kidney: Secondary | ICD-10-CM | POA: Diagnosis not present

## 2020-07-11 DIAGNOSIS — G4733 Obstructive sleep apnea (adult) (pediatric): Secondary | ICD-10-CM | POA: Diagnosis not present

## 2020-07-11 DIAGNOSIS — E785 Hyperlipidemia, unspecified: Secondary | ICD-10-CM | POA: Diagnosis not present

## 2020-07-11 DIAGNOSIS — Z9981 Dependence on supplemental oxygen: Secondary | ICD-10-CM | POA: Diagnosis not present

## 2020-07-11 DIAGNOSIS — K5900 Constipation, unspecified: Secondary | ICD-10-CM | POA: Diagnosis not present

## 2020-07-11 DIAGNOSIS — J441 Chronic obstructive pulmonary disease with (acute) exacerbation: Secondary | ICD-10-CM | POA: Diagnosis not present

## 2020-07-11 DIAGNOSIS — I5032 Chronic diastolic (congestive) heart failure: Secondary | ICD-10-CM | POA: Diagnosis not present

## 2020-07-11 DIAGNOSIS — R296 Repeated falls: Secondary | ICD-10-CM | POA: Diagnosis not present

## 2020-07-11 DIAGNOSIS — I69354 Hemiplegia and hemiparesis following cerebral infarction affecting left non-dominant side: Secondary | ICD-10-CM | POA: Diagnosis not present

## 2020-07-11 DIAGNOSIS — Z9181 History of falling: Secondary | ICD-10-CM | POA: Diagnosis not present

## 2020-07-11 DIAGNOSIS — Z89612 Acquired absence of left leg above knee: Secondary | ICD-10-CM | POA: Diagnosis not present

## 2020-07-11 DIAGNOSIS — J9621 Acute and chronic respiratory failure with hypoxia: Secondary | ICD-10-CM | POA: Diagnosis not present

## 2020-07-11 DIAGNOSIS — G9050 Complex regional pain syndrome I, unspecified: Secondary | ICD-10-CM | POA: Diagnosis not present

## 2020-07-11 DIAGNOSIS — I110 Hypertensive heart disease with heart failure: Secondary | ICD-10-CM | POA: Diagnosis not present

## 2020-07-18 DIAGNOSIS — Z79899 Other long term (current) drug therapy: Secondary | ICD-10-CM | POA: Diagnosis not present

## 2020-07-18 DIAGNOSIS — R739 Hyperglycemia, unspecified: Secondary | ICD-10-CM | POA: Diagnosis not present

## 2020-07-18 DIAGNOSIS — J449 Chronic obstructive pulmonary disease, unspecified: Secondary | ICD-10-CM | POA: Diagnosis not present

## 2020-07-18 DIAGNOSIS — E785 Hyperlipidemia, unspecified: Secondary | ICD-10-CM | POA: Diagnosis not present

## 2020-07-30 DIAGNOSIS — J449 Chronic obstructive pulmonary disease, unspecified: Secondary | ICD-10-CM | POA: Diagnosis not present

## 2020-08-14 DIAGNOSIS — R918 Other nonspecific abnormal finding of lung field: Secondary | ICD-10-CM | POA: Diagnosis not present

## 2020-08-14 DIAGNOSIS — J301 Allergic rhinitis due to pollen: Secondary | ICD-10-CM | POA: Diagnosis not present

## 2020-08-14 DIAGNOSIS — G4733 Obstructive sleep apnea (adult) (pediatric): Secondary | ICD-10-CM | POA: Diagnosis not present

## 2020-08-14 DIAGNOSIS — J9611 Chronic respiratory failure with hypoxia: Secondary | ICD-10-CM | POA: Diagnosis not present

## 2020-08-14 DIAGNOSIS — R5383 Other fatigue: Secondary | ICD-10-CM | POA: Diagnosis not present

## 2020-08-15 DIAGNOSIS — E785 Hyperlipidemia, unspecified: Secondary | ICD-10-CM | POA: Diagnosis not present

## 2020-08-15 DIAGNOSIS — G629 Polyneuropathy, unspecified: Secondary | ICD-10-CM | POA: Diagnosis not present

## 2020-08-15 DIAGNOSIS — R739 Hyperglycemia, unspecified: Secondary | ICD-10-CM | POA: Diagnosis not present

## 2020-08-15 DIAGNOSIS — J449 Chronic obstructive pulmonary disease, unspecified: Secondary | ICD-10-CM | POA: Diagnosis not present

## 2020-08-20 DIAGNOSIS — Z9181 History of falling: Secondary | ICD-10-CM | POA: Diagnosis not present

## 2020-08-20 DIAGNOSIS — E785 Hyperlipidemia, unspecified: Secondary | ICD-10-CM | POA: Diagnosis not present

## 2020-08-20 DIAGNOSIS — Z0000 Encounter for general adult medical examination without abnormal findings: Secondary | ICD-10-CM | POA: Diagnosis not present

## 2020-08-28 DIAGNOSIS — Z7189 Other specified counseling: Secondary | ICD-10-CM | POA: Diagnosis not present

## 2020-08-28 DIAGNOSIS — J301 Allergic rhinitis due to pollen: Secondary | ICD-10-CM | POA: Diagnosis not present

## 2020-08-28 DIAGNOSIS — R5383 Other fatigue: Secondary | ICD-10-CM | POA: Diagnosis not present

## 2020-08-28 DIAGNOSIS — R918 Other nonspecific abnormal finding of lung field: Secondary | ICD-10-CM | POA: Diagnosis not present

## 2020-08-28 DIAGNOSIS — J9611 Chronic respiratory failure with hypoxia: Secondary | ICD-10-CM | POA: Diagnosis not present

## 2020-08-28 DIAGNOSIS — G4733 Obstructive sleep apnea (adult) (pediatric): Secondary | ICD-10-CM | POA: Diagnosis not present

## 2020-08-30 DIAGNOSIS — J449 Chronic obstructive pulmonary disease, unspecified: Secondary | ICD-10-CM | POA: Diagnosis not present

## 2020-09-19 DIAGNOSIS — J449 Chronic obstructive pulmonary disease, unspecified: Secondary | ICD-10-CM | POA: Diagnosis not present

## 2020-09-19 DIAGNOSIS — E785 Hyperlipidemia, unspecified: Secondary | ICD-10-CM | POA: Diagnosis not present

## 2020-09-19 DIAGNOSIS — G43909 Migraine, unspecified, not intractable, without status migrainosus: Secondary | ICD-10-CM | POA: Diagnosis not present

## 2020-09-22 DIAGNOSIS — Z1231 Encounter for screening mammogram for malignant neoplasm of breast: Secondary | ICD-10-CM | POA: Diagnosis not present

## 2020-09-25 DIAGNOSIS — J449 Chronic obstructive pulmonary disease, unspecified: Secondary | ICD-10-CM | POA: Diagnosis not present

## 2020-09-25 DIAGNOSIS — R918 Other nonspecific abnormal finding of lung field: Secondary | ICD-10-CM | POA: Diagnosis not present

## 2020-09-25 DIAGNOSIS — J9611 Chronic respiratory failure with hypoxia: Secondary | ICD-10-CM | POA: Diagnosis not present

## 2020-09-25 DIAGNOSIS — R5383 Other fatigue: Secondary | ICD-10-CM | POA: Diagnosis not present

## 2020-09-25 DIAGNOSIS — J301 Allergic rhinitis due to pollen: Secondary | ICD-10-CM | POA: Diagnosis not present

## 2020-09-25 DIAGNOSIS — G4733 Obstructive sleep apnea (adult) (pediatric): Secondary | ICD-10-CM | POA: Diagnosis not present

## 2020-09-30 DIAGNOSIS — J449 Chronic obstructive pulmonary disease, unspecified: Secondary | ICD-10-CM | POA: Diagnosis not present

## 2020-10-21 DIAGNOSIS — S88919A Complete traumatic amputation of unspecified lower leg, level unspecified, initial encounter: Secondary | ICD-10-CM | POA: Diagnosis not present

## 2020-10-21 DIAGNOSIS — J449 Chronic obstructive pulmonary disease, unspecified: Secondary | ICD-10-CM | POA: Diagnosis not present

## 2020-10-21 DIAGNOSIS — R739 Hyperglycemia, unspecified: Secondary | ICD-10-CM | POA: Diagnosis not present

## 2020-10-21 DIAGNOSIS — E785 Hyperlipidemia, unspecified: Secondary | ICD-10-CM | POA: Diagnosis not present

## 2020-10-21 DIAGNOSIS — M810 Age-related osteoporosis without current pathological fracture: Secondary | ICD-10-CM | POA: Diagnosis not present

## 2020-10-21 DIAGNOSIS — G629 Polyneuropathy, unspecified: Secondary | ICD-10-CM | POA: Diagnosis not present

## 2020-10-21 DIAGNOSIS — Z79899 Other long term (current) drug therapy: Secondary | ICD-10-CM | POA: Diagnosis not present

## 2020-10-28 DIAGNOSIS — J449 Chronic obstructive pulmonary disease, unspecified: Secondary | ICD-10-CM | POA: Diagnosis not present

## 2020-11-21 DIAGNOSIS — E785 Hyperlipidemia, unspecified: Secondary | ICD-10-CM | POA: Diagnosis not present

## 2020-11-21 DIAGNOSIS — J449 Chronic obstructive pulmonary disease, unspecified: Secondary | ICD-10-CM | POA: Diagnosis not present

## 2020-11-21 DIAGNOSIS — R739 Hyperglycemia, unspecified: Secondary | ICD-10-CM | POA: Diagnosis not present

## 2020-11-21 DIAGNOSIS — I700 Atherosclerosis of aorta: Secondary | ICD-10-CM | POA: Diagnosis not present

## 2020-11-28 DIAGNOSIS — J449 Chronic obstructive pulmonary disease, unspecified: Secondary | ICD-10-CM | POA: Diagnosis not present

## 2020-12-04 DIAGNOSIS — G4733 Obstructive sleep apnea (adult) (pediatric): Secondary | ICD-10-CM | POA: Diagnosis not present

## 2020-12-04 DIAGNOSIS — R5383 Other fatigue: Secondary | ICD-10-CM | POA: Diagnosis not present

## 2020-12-04 DIAGNOSIS — J9611 Chronic respiratory failure with hypoxia: Secondary | ICD-10-CM | POA: Diagnosis not present

## 2020-12-04 DIAGNOSIS — J449 Chronic obstructive pulmonary disease, unspecified: Secondary | ICD-10-CM | POA: Diagnosis not present

## 2020-12-04 DIAGNOSIS — J301 Allergic rhinitis due to pollen: Secondary | ICD-10-CM | POA: Diagnosis not present

## 2020-12-23 DIAGNOSIS — N390 Urinary tract infection, site not specified: Secondary | ICD-10-CM | POA: Diagnosis not present

## 2020-12-23 DIAGNOSIS — G4700 Insomnia, unspecified: Secondary | ICD-10-CM | POA: Diagnosis not present

## 2020-12-23 DIAGNOSIS — Z79899 Other long term (current) drug therapy: Secondary | ICD-10-CM | POA: Diagnosis not present

## 2020-12-23 DIAGNOSIS — Z139 Encounter for screening, unspecified: Secondary | ICD-10-CM | POA: Diagnosis not present

## 2020-12-23 DIAGNOSIS — E785 Hyperlipidemia, unspecified: Secondary | ICD-10-CM | POA: Diagnosis not present

## 2020-12-23 DIAGNOSIS — M545 Low back pain, unspecified: Secondary | ICD-10-CM | POA: Diagnosis not present

## 2020-12-24 ENCOUNTER — Emergency Department (HOSPITAL_COMMUNITY): Payer: Medicare Other

## 2020-12-24 ENCOUNTER — Inpatient Hospital Stay (HOSPITAL_COMMUNITY): Payer: Medicare Other

## 2020-12-24 ENCOUNTER — Encounter (HOSPITAL_COMMUNITY): Payer: Self-pay | Admitting: Emergency Medicine

## 2020-12-24 ENCOUNTER — Inpatient Hospital Stay (HOSPITAL_COMMUNITY)
Admission: EM | Admit: 2020-12-24 | Discharge: 2020-12-27 | DRG: 190 | Disposition: A | Discharge status: Home / Self Care | Payer: Medicare Other | Attending: Family Medicine | Admitting: Family Medicine

## 2020-12-24 ENCOUNTER — Other Ambulatory Visit: Payer: Self-pay

## 2020-12-24 DIAGNOSIS — Z79899 Other long term (current) drug therapy: Secondary | ICD-10-CM

## 2020-12-24 DIAGNOSIS — J9811 Atelectasis: Secondary | ICD-10-CM | POA: Diagnosis not present

## 2020-12-24 DIAGNOSIS — Z87442 Personal history of urinary calculi: Secondary | ICD-10-CM | POA: Diagnosis not present

## 2020-12-24 DIAGNOSIS — G90522 Complex regional pain syndrome I of left lower limb: Secondary | ICD-10-CM | POA: Diagnosis present

## 2020-12-24 DIAGNOSIS — J9621 Acute and chronic respiratory failure with hypoxia: Secondary | ICD-10-CM | POA: Diagnosis present

## 2020-12-24 DIAGNOSIS — Z8673 Personal history of transient ischemic attack (TIA), and cerebral infarction without residual deficits: Secondary | ICD-10-CM

## 2020-12-24 DIAGNOSIS — Z635 Disruption of family by separation and divorce: Secondary | ICD-10-CM

## 2020-12-24 DIAGNOSIS — I5032 Chronic diastolic (congestive) heart failure: Secondary | ICD-10-CM | POA: Diagnosis present

## 2020-12-24 DIAGNOSIS — K219 Gastro-esophageal reflux disease without esophagitis: Secondary | ICD-10-CM | POA: Diagnosis not present

## 2020-12-24 DIAGNOSIS — Z885 Allergy status to narcotic agent status: Secondary | ICD-10-CM

## 2020-12-24 DIAGNOSIS — T50915A Adverse effect of multiple unspecified drugs, medicaments and biological substances, initial encounter: Secondary | ICD-10-CM | POA: Diagnosis not present

## 2020-12-24 DIAGNOSIS — R079 Chest pain, unspecified: Secondary | ICD-10-CM | POA: Diagnosis not present

## 2020-12-24 DIAGNOSIS — F419 Anxiety disorder, unspecified: Secondary | ICD-10-CM | POA: Diagnosis present

## 2020-12-24 DIAGNOSIS — J441 Chronic obstructive pulmonary disease with (acute) exacerbation: Principal | ICD-10-CM | POA: Diagnosis present

## 2020-12-24 DIAGNOSIS — Z89619 Acquired absence of unspecified leg above knee: Secondary | ICD-10-CM

## 2020-12-24 DIAGNOSIS — Z9981 Dependence on supplemental oxygen: Secondary | ICD-10-CM

## 2020-12-24 DIAGNOSIS — Z882 Allergy status to sulfonamides status: Secondary | ICD-10-CM

## 2020-12-24 DIAGNOSIS — G4730 Sleep apnea, unspecified: Secondary | ICD-10-CM | POA: Diagnosis present

## 2020-12-24 DIAGNOSIS — Z89612 Acquired absence of left leg above knee: Secondary | ICD-10-CM | POA: Diagnosis not present

## 2020-12-24 DIAGNOSIS — G4733 Obstructive sleep apnea (adult) (pediatric): Secondary | ICD-10-CM | POA: Diagnosis not present

## 2020-12-24 DIAGNOSIS — I70202 Unspecified atherosclerosis of native arteries of extremities, left leg: Secondary | ICD-10-CM | POA: Diagnosis not present

## 2020-12-24 DIAGNOSIS — E042 Nontoxic multinodular goiter: Secondary | ICD-10-CM | POA: Diagnosis present

## 2020-12-24 DIAGNOSIS — Z66 Do not resuscitate: Secondary | ICD-10-CM | POA: Diagnosis not present

## 2020-12-24 DIAGNOSIS — Z90710 Acquired absence of both cervix and uterus: Secondary | ICD-10-CM

## 2020-12-24 DIAGNOSIS — F32A Depression, unspecified: Secondary | ICD-10-CM | POA: Diagnosis not present

## 2020-12-24 DIAGNOSIS — E785 Hyperlipidemia, unspecified: Secondary | ICD-10-CM | POA: Diagnosis present

## 2020-12-24 DIAGNOSIS — Z881 Allergy status to other antibiotic agents status: Secondary | ICD-10-CM

## 2020-12-24 DIAGNOSIS — I499 Cardiac arrhythmia, unspecified: Secondary | ICD-10-CM | POA: Diagnosis not present

## 2020-12-24 DIAGNOSIS — R109 Unspecified abdominal pain: Secondary | ICD-10-CM | POA: Diagnosis not present

## 2020-12-24 DIAGNOSIS — R404 Transient alteration of awareness: Secondary | ICD-10-CM | POA: Diagnosis not present

## 2020-12-24 DIAGNOSIS — G928 Other toxic encephalopathy: Secondary | ICD-10-CM | POA: Diagnosis not present

## 2020-12-24 DIAGNOSIS — R000 Tachycardia, unspecified: Secondary | ICD-10-CM | POA: Diagnosis not present

## 2020-12-24 DIAGNOSIS — Z743 Need for continuous supervision: Secondary | ICD-10-CM | POA: Diagnosis not present

## 2020-12-24 DIAGNOSIS — Z20822 Contact with and (suspected) exposure to covid-19: Secondary | ICD-10-CM | POA: Diagnosis present

## 2020-12-24 DIAGNOSIS — M545 Low back pain, unspecified: Secondary | ICD-10-CM | POA: Diagnosis not present

## 2020-12-24 DIAGNOSIS — N390 Urinary tract infection, site not specified: Secondary | ICD-10-CM | POA: Diagnosis present

## 2020-12-24 DIAGNOSIS — G8929 Other chronic pain: Secondary | ICD-10-CM | POA: Insufficient documentation

## 2020-12-24 DIAGNOSIS — E873 Alkalosis: Secondary | ICD-10-CM | POA: Diagnosis not present

## 2020-12-24 DIAGNOSIS — J449 Chronic obstructive pulmonary disease, unspecified: Secondary | ICD-10-CM | POA: Diagnosis present

## 2020-12-24 DIAGNOSIS — Z87891 Personal history of nicotine dependence: Secondary | ICD-10-CM

## 2020-12-24 DIAGNOSIS — J90 Pleural effusion, not elsewhere classified: Secondary | ICD-10-CM | POA: Diagnosis not present

## 2020-12-24 DIAGNOSIS — R6889 Other general symptoms and signs: Secondary | ICD-10-CM | POA: Diagnosis not present

## 2020-12-24 DIAGNOSIS — E041 Nontoxic single thyroid nodule: Secondary | ICD-10-CM | POA: Diagnosis not present

## 2020-12-24 DIAGNOSIS — R0600 Dyspnea, unspecified: Secondary | ICD-10-CM | POA: Diagnosis not present

## 2020-12-24 DIAGNOSIS — Z886 Allergy status to analgesic agent status: Secondary | ICD-10-CM

## 2020-12-24 HISTORY — DX: Do not resuscitate: Z66

## 2020-12-24 HISTORY — DX: Chronic obstructive pulmonary disease with (acute) exacerbation: J44.1

## 2020-12-24 LAB — TROPONIN I (HIGH SENSITIVITY)
Troponin I (High Sensitivity): 4 ng/L (ref <18)
Troponin I (High Sensitivity): 5 ng/L (ref <18)

## 2020-12-24 LAB — URINALYSIS, ROUTINE W REFLEX MICROSCOPIC
Bilirubin Urine: NEGATIVE
Glucose, UA: NEGATIVE mg/dL
Hgb urine dipstick: NEGATIVE
Ketones, ur: NEGATIVE mg/dL
Nitrite: NEGATIVE
Protein, ur: NEGATIVE mg/dL
Specific Gravity, Urine: 1.024 (ref 1.005–1.030)
pH: 6 (ref 5.0–8.0)

## 2020-12-24 LAB — BASIC METABOLIC PANEL
Anion gap: 5 (ref 5–15)
BUN: 13 mg/dL (ref 8–23)
CO2: 38 mmol/L — ABNORMAL HIGH (ref 22–32)
Calcium: 9.7 mg/dL (ref 8.9–10.3)
Chloride: 100 mmol/L (ref 98–111)
Creatinine, Ser: 0.52 mg/dL (ref 0.44–1.00)
GFR, Estimated: >60 mL/min (ref >60)
Glucose, Bld: 145 mg/dL — ABNORMAL HIGH (ref 70–99)
Potassium: 3.4 mmol/L — ABNORMAL LOW (ref 3.5–5.1)
Sodium: 143 mmol/L (ref 135–145)

## 2020-12-24 LAB — CBC
HCT: 38.9 % (ref 36.0–46.0)
Hemoglobin: 11.5 g/dL — ABNORMAL LOW (ref 12.0–15.0)
MCH: 28.3 pg (ref 26.0–34.0)
MCHC: 29.6 g/dL — ABNORMAL LOW (ref 30.0–36.0)
MCV: 95.8 fL (ref 80.0–100.0)
Platelets: 271 10*3/uL (ref 150–400)
RBC: 4.06 MIL/uL (ref 3.87–5.11)
RDW: 13.8 % (ref 11.5–15.5)
WBC: 17.4 10*3/uL — ABNORMAL HIGH (ref 4.0–10.5)
nRBC: 0 % (ref 0.0–0.2)

## 2020-12-24 LAB — HIV ANTIBODY (ROUTINE TESTING W REFLEX): HIV Screen 4th Generation wRfx: NONREACTIVE

## 2020-12-24 LAB — RESP PANEL BY RT-PCR (FLU A&B, COVID) ARPGX2
Influenza A by PCR: NEGATIVE
Influenza B by PCR: NEGATIVE
SARS Coronavirus 2 by RT PCR: NEGATIVE

## 2020-12-24 MED ORDER — HYDRALAZINE HCL 20 MG/ML IJ SOLN: 5.0000 mg | INTRAMUSCULAR | Status: DC | PRN

## 2020-12-24 MED ORDER — BISACODYL 5 MG PO TBEC: 5.0000 mg | DELAYED_RELEASE_TABLET | Freq: Every day | ORAL | Status: DC | PRN

## 2020-12-24 MED ORDER — TRAMADOL HCL 50 MG PO TABS
50.0000 mg | ORAL_TABLET | Freq: Two times a day (BID) | ORAL | Status: DC | PRN
  Administered 2020-12-24: 50 mg via ORAL
  Filled 2020-12-24: qty 1

## 2020-12-24 MED ORDER — HYDROCHLOROTHIAZIDE 25 MG PO TABS: 25.0000 mg | ORAL_TABLET | Freq: Every day | ORAL | Status: DC

## 2020-12-24 MED ORDER — QUETIAPINE FUMARATE 25 MG PO TABS: 25.0000 mg | ORAL_TABLET | Freq: Two times a day (BID) | ORAL | Status: DC

## 2020-12-24 MED ORDER — FLUTICASONE FUROATE-VILANTEROL 100-25 MCG/INH IN AEPB
1.0000 | INHALATION_SPRAY | Freq: Every day | RESPIRATORY_TRACT | Status: DC
  Administered 2020-12-26 – 2020-12-27 (×2): 1 via RESPIRATORY_TRACT
  Filled 2020-12-24 (×2): qty 28

## 2020-12-24 MED ORDER — ACETAMINOPHEN 650 MG RE SUPP: 650.0000 mg | Freq: Four times a day (QID) | RECTAL | Status: DC | PRN

## 2020-12-24 MED ORDER — ACETAMINOPHEN 325 MG PO TABS
650.0000 mg | ORAL_TABLET | Freq: Four times a day (QID) | ORAL | Status: DC | PRN
  Administered 2020-12-24 – 2020-12-25 (×3): 650 mg via ORAL
  Filled 2020-12-24 (×3): qty 2

## 2020-12-24 MED ORDER — ESCITALOPRAM OXALATE 10 MG PO TABS: 10.0000 mg | ORAL_TABLET | Freq: Every day | ORAL | Status: DC

## 2020-12-24 MED ORDER — QUETIAPINE FUMARATE 50 MG PO TABS
100.0000 mg | ORAL_TABLET | Freq: Two times a day (BID) | ORAL | Status: DC
  Administered 2020-12-24 – 2020-12-25 (×2): 100 mg via ORAL
  Filled 2020-12-24 (×2): qty 2

## 2020-12-24 MED ORDER — FLUTICASONE-UMECLIDIN-VILANT 100-62.5-25 MCG/INH IN AEPB: 1.0000 | INHALATION_SPRAY | Freq: Every day | RESPIRATORY_TRACT | Status: DC

## 2020-12-24 MED ORDER — ENOXAPARIN SODIUM 40 MG/0.4ML ~~LOC~~ SOLN
40.0000 mg | SUBCUTANEOUS | Status: DC
  Administered 2020-12-24 – 2020-12-27 (×4): 40 mg via SUBCUTANEOUS
  Filled 2020-12-24 (×4): qty 0.4

## 2020-12-24 MED ORDER — FENTANYL CITRATE (PF) 100 MCG/2ML IJ SOLN
50.0000 ug | Freq: Once | INTRAMUSCULAR | Status: AC
  Administered 2020-12-24: 50 ug via INTRAVENOUS
  Filled 2020-12-24: qty 2

## 2020-12-24 MED ORDER — POLYETHYLENE GLYCOL 3350 17 G PO PACK: 17.0000 g | PACK | Freq: Every day | ORAL | Status: DC | PRN

## 2020-12-24 MED ORDER — DULOXETINE HCL 60 MG PO CPEP: 60.0000 mg | ORAL_CAPSULE | Freq: Every day | ORAL | Status: DC

## 2020-12-24 MED ORDER — IPRATROPIUM-ALBUTEROL 0.5-2.5 (3) MG/3ML IN SOLN
3.0000 mL | Freq: Once | RESPIRATORY_TRACT | Status: AC
  Administered 2020-12-24: 3 mL via RESPIRATORY_TRACT
  Filled 2020-12-24: qty 3

## 2020-12-24 MED ORDER — METHYLPREDNISOLONE SODIUM SUCC 125 MG IJ SOLR
125.0000 mg | Freq: Once | INTRAMUSCULAR | Status: AC
  Administered 2020-12-24: 125 mg via INTRAVENOUS
  Filled 2020-12-24: qty 2

## 2020-12-24 MED ORDER — LACTATED RINGERS IV SOLN: INTRAVENOUS | Status: DC

## 2020-12-24 MED ORDER — UMECLIDINIUM BROMIDE 62.5 MCG/INH IN AEPB
1.0000 | INHALATION_SPRAY | Freq: Every day | RESPIRATORY_TRACT | Status: DC
  Administered 2020-12-26 – 2020-12-27 (×2): 1 via RESPIRATORY_TRACT
  Filled 2020-12-24 (×2): qty 7

## 2020-12-24 MED ORDER — FLUTICASONE PROPIONATE 50 MCG/ACT NA SUSP
2.0000 | Freq: Every day | NASAL | Status: DC
  Administered 2020-12-25 – 2020-12-27 (×2): 2 via NASAL
  Filled 2020-12-24 (×2): qty 16

## 2020-12-24 MED ORDER — ALBUTEROL SULFATE (2.5 MG/3ML) 0.083% IN NEBU: 2.5000 mg | INHALATION_SOLUTION | RESPIRATORY_TRACT | Status: DC | PRN

## 2020-12-24 MED ORDER — ONDANSETRON HCL 4 MG/2ML IJ SOLN: 4.0000 mg | Freq: Four times a day (QID) | INTRAMUSCULAR | Status: DC | PRN

## 2020-12-24 MED ORDER — GABAPENTIN 300 MG PO CAPS: 600.0000 mg | ORAL_CAPSULE | Freq: Three times a day (TID) | ORAL | Status: DC

## 2020-12-24 MED ORDER — PANTOPRAZOLE SODIUM 40 MG PO TBEC
40.0000 mg | DELAYED_RELEASE_TABLET | Freq: Every day | ORAL | Status: DC
  Administered 2020-12-24 – 2020-12-27 (×4): 40 mg via ORAL
  Filled 2020-12-24 (×4): qty 1

## 2020-12-24 MED ORDER — ONDANSETRON HCL 4 MG PO TABS: 4.0000 mg | ORAL_TABLET | Freq: Four times a day (QID) | ORAL | Status: DC | PRN

## 2020-12-24 MED ORDER — OXYCODONE-ACETAMINOPHEN 5-325 MG PO TABS: 1.0000 | ORAL_TABLET | ORAL | Status: DC | PRN

## 2020-12-24 MED ORDER — DOCUSATE SODIUM 100 MG PO CAPS
100.0000 mg | ORAL_CAPSULE | Freq: Two times a day (BID) | ORAL | Status: DC
  Administered 2020-12-24 – 2020-12-27 (×7): 100 mg via ORAL
  Filled 2020-12-24 (×7): qty 1

## 2020-12-24 MED ORDER — IOHEXOL 350 MG/ML SOLN
75.0000 mL | Freq: Once | INTRAVENOUS | Status: AC | PRN
  Administered 2020-12-24: 75 mL via INTRAVENOUS

## 2020-12-24 MED ORDER — ATORVASTATIN CALCIUM 40 MG PO TABS
80.0000 mg | ORAL_TABLET | Freq: Every day | ORAL | Status: DC
  Administered 2020-12-24 – 2020-12-26 (×3): 80 mg via ORAL
  Filled 2020-12-24 (×3): qty 2

## 2020-12-24 MED ORDER — ONDANSETRON HCL 4 MG/2ML IJ SOLN
4.0000 mg | Freq: Once | INTRAMUSCULAR | Status: AC
  Administered 2020-12-24: 4 mg via INTRAVENOUS
  Filled 2020-12-24: qty 2

## 2020-12-24 MED ORDER — PREDNISONE 20 MG PO TABS
40.0000 mg | ORAL_TABLET | Freq: Every day | ORAL | Status: DC
  Administered 2020-12-25 – 2020-12-27 (×3): 40 mg via ORAL
  Filled 2020-12-24 (×3): qty 2

## 2020-12-24 MED ORDER — GABAPENTIN 300 MG PO CAPS
300.0000 mg | ORAL_CAPSULE | Freq: Three times a day (TID) | ORAL | Status: DC
  Administered 2020-12-24 – 2020-12-25 (×3): 300 mg via ORAL
  Filled 2020-12-24 (×3): qty 1

## 2020-12-24 MED ORDER — THEOPHYLLINE ER 300 MG PO TB12: 300.0000 mg | ORAL_TABLET | Freq: Two times a day (BID) | ORAL | Status: DC

## 2020-12-24 NOTE — ED Notes (Signed)
Patient transported to CT via stretcher in stable condition 

## 2020-12-24 NOTE — Progress Notes (Signed)
Pt states she does not use CPAP at home, only O2.  

## 2020-12-24 NOTE — H&P (Signed)
History and Physical    Amanda Lewis KGM:[id number] DOB: 1956-08-06 DOA: 12/24/2020  PCP: Amanda Proffer, MD Consultants:  Chodri - pulmonogy Patient coming from:  Home - lives with son; NOK: Amanda Lewis, [phone]  Chief Complaint: SOB  HPI: Amanda Lewis is a 65 y.o. female with medical history significant of CVA; OSA/COPD on continuous home O2; RSD on LLE; PVD s/p  L AKA; and chronic back pain presenting with SOB.  She reports that she started having difficulty breathing about 4 hours before she came to the ER.  She reports that her "kidneys were hurting" every time she went to the bathroom.  She went to the bathroom and came back and thought she was going to pass out.  She became SOB and her O2 sat dropped to 78%.  She wears 2L [place] O2 at home.  Her son increased the O2 to 3L.  Increased cough, dry.  She has a headache.  No fever.  No sick contacts.  Increased wheezing.  She went to the doctor day before yesterday and she has been on an antibiotic.    ED Course:   Carryover, per Dr. Rachael Lewis:  65 yo female with hx of COPD. Increase SOB and work of breathing. Uses oxygen at home at 3 l/min but requiring more oxygen now to maintain O2 saturation.  WBC elevated but no sign of infection. CTA chest negative for PE or pneumonia.  No fever.  Review of Systems: As per HPI; otherwise review of systems reviewed and negative.   Ambulatory Status:  Non-ambulatory, uses a wheelchair  COVID Vaccine Status:  Complete  Past Medical History:  Diagnosis Date  . Anemia   . Anxiety    on treatment  . Arthritis    arms  . Asthma    exertional  . Chronic lower back pain   . COPD (chronic obstructive pulmonary disease) (HCC)    usesO2 24/7  . Depression   . DNR (do not resuscitate)   . Dysuria-frequency syndrome   . GERD (gastroesophageal reflux disease)   . Headache(784.0)    intermittent migraines  . History of kidney stones   . Multiple thyroid nodules   . Pneumonia   .  Recurrent upper respiratory infection (URI)   . Rotator cuff tear, right   . RSD lower limb    left lower leg  . Sleep apnea 2010   uses continous oxygen  . Stroke [facility]) 1997    Past Surgical History:  Procedure Laterality Date  . ABDOMINAL HYSTERECTOMY  2003  . AMPUTATION  01/12/2012   Procedure: AMPUTATION ABOVE KNEE;  Surgeon: Amanda Mustard, MD;  Location: [facility] OR;  Service: Orthopedics;  Laterality: Left;  Left Above Knee Amputation  . APPENDECTOMY    . FRACTURE SURGERY    . Morphine Pump    . Morphine Pump Removed  2006  . ORIF ANKLE FRACTURE  1995  . STUMP REVISION Left 07/19/2015   Procedure: Revision Left Above Knee Amputation;  Surgeon: Amanda Mustard, MD;  Location: [facility] OR;  Service: Orthopedics;  Laterality: Left;    Social History   Socioeconomic History  . Marital status: Legally Separated    Spouse name: Not on file  . Number of children: Not on file  . Years of education: Not on file  . Highest education level: Not on file  Occupational History  . Occupation: disabled  Tobacco Use  . Smoking status: Former Smoker    Packs/day: 2.00  Years: 35.00    Pack years: 70.00    Types: Cigarettes    Quit date: 09/27/2008    Years since quitting: 12.2  . Smokeless tobacco: Never Used  Substance and Sexual Activity  . Alcohol use: No  . Drug use: No  . Sexual activity: Not Currently    Birth control/protection: Surgical  Other Topics Concern  . Not on file  Social History Narrative  . Not on file   Social Determinants of Health   Financial Resource Strain: Not on file  Food Insecurity: Not on file  Transportation Needs: Not on file  Physical Activity: Not on file  Stress: Not on file  Social Connections: Not on file  Intimate Partner Violence: Not on file    Allergies  Allergen Reactions  . Aspirin Hives  . Bactrim [Sulfamethoxazole-Trimethoprim] Hives  . Nitroglycerin Other (See Comments)    Blood pressure drops dangerously low  . Oxycodone-Aspirin  Hives    Takes percocet without problems  . Percodan [Oxycodone-Aspirin] Hives    Takes percocet without problems  . Sulfamethoxazole-Trimethoprim Hives  . Tape Rash    Adhesive    Family History  Problem Relation Age of Onset  . Anesthesia problems Neg Hx     Prior to Admission medications   Medication Sig Start Date End Date Taking? Authorizing Provider  albuterol (PROVENTIL HFA;VENTOLIN HFA) 108 (90 BASE) MCG/ACT inhaler Inhale 2 puffs into the lungs every 6 (six) hours as needed. For shortness of breath     [provider]  atorvastatin (LIPITOR) 40 MG tablet Take 40 mg by mouth daily. 07/02/15   [provider]  DULoxetine (CYMBALTA) 60 MG capsule Take 60 mg by mouth daily.      [provider]  escitalopram (LEXAPRO) 10 MG tablet Take 10 mg by mouth daily. 07/02/15   [provider]  furosemide (LASIX) 40 MG tablet Take 40 mg by mouth daily. 06/24/15   [provider]  gabapentin (NEURONTIN) 300 MG capsule Take 600 mg by mouth 3 (three) times daily. 07/04/15   [provider]  hydrochlorothiazide (HYDRODIURIL) 25 MG tablet Take 25 mg by mouth daily.      [provider]  omeprazole (PRILOSEC) 40 MG capsule Take 40 mg by mouth daily. 07/02/15   [provider]  oxyCODONE-acetaminophen (ROXICET) 5-325 MG tablet Take 1 tablet by mouth every 4 (four) hours as needed for severe pain. 07/19/15   Amanda Mustarduda, Amanda V, MD  QUEtiapine (SEROQUEL) 25 MG tablet Take 25 mg by mouth 2 (two) times daily.     [provider]  theophylline (THEODUR) 300 MG 12 hr tablet Take 300 mg by mouth 2 (two) times daily.      [provider]  traMADol (ULTRAM) 50 MG tablet Take 50 mg by mouth 2 (two) times daily as needed. for pain 07/02/15   [provider]    Physical Exam: Vitals:   12/24/20 1015 12/24/20 1030 12/24/20 1045 12/24/20 1102  BP: (!) 124/53 (!) 115/55 (!) 106/53 (!) 120/54  Pulse: 100 100 98 98  Resp:  18 17 16 16   Temp:   98.4 F (36.9 C) 98.6 F (37 C)  TempSrc:   Oral Oral  SpO2: 99% 99% 99% 95%  Weight:      Height:         . General:  Appears calm and comfortable and is in NAD, sedentary . Eyes:   EOMI, normal lids, iris . ENT:  grossly normal hearing, lips &  tongue, mmm; artificial upper dentition, absent lower dentition . Neck:  no LAD, masses or thyromegaly . Cardiovascular:  RRR, no m/r/g. No LE edema.  Marland Kitchen Respiratory:   CTA bilaterally with no wheezes/rales/rhonchi.  Normal respiratory effort on 4L [place] O2. . Abdomen:  soft, NT, ND . Skin:  no rash or induration seen on limited exam . Musculoskeletal:  S/p L AKA . Psychiatric:  Blunted mood and affect, speech fluent and appropriate, AOx3 . Neurologic:  CN 2-12 grossly intact, moves all extremities in coordinated fashion    Radiological Exams on Admission: Independently reviewed - see discussion in A/P where applicable  CT ANGIO CHEST PE W OR WO CONTRAST  Result Date: 12/24/2020 CLINICAL DATA:  Chest pain, dyspnea EXAM: CT ANGIOGRAPHY CHEST WITH CONTRAST TECHNIQUE: Multidetector CT imaging of the chest was performed using the standard protocol during bolus administration of intravenous contrast. Multiplanar CT image reconstructions and MIPs were obtained to evaluate the vascular anatomy. CONTRAST:  75mL OMNIPAQUE IOHEXOL 350 MG/ML SOLN COMPARISON:  06/04/2020 FINDINGS: Cardiovascular: There is adequate opacification of the pulmonary arterial tree. No intraluminal filling defect identified to suggest acute pulmonary embolism. The central pulmonary arteries are of normal caliber. No significant coronary artery calcification. Global cardiac size within normal limits. No pericardial effusion. Mild atherosclerotic calcification within the thoracic aorta. No aortic aneurysm. Mediastinum/Nodes: Stable 2.4 cm nodule within the right thyroid lobe, better assessed on prior sonogram of 05/31/2019. This has, however, increased in size since  that examination where it measured 1.8 cm in greatest dimension. No pathologic thoracic adenopathy. The esophagus is unremarkable. Lungs/Pleura: Trace bilateral pleural effusions. Bibasilar atelectasis. No focal pulmonary nodules or infiltrates. No pneumothorax. Central airways are widely patent. Upper Abdomen: No acute abnormality. Musculoskeletal: No acute bone abnormality. No lytic or blastic bone lesion. Review of the MIP images confirms the above findings. IMPRESSION: No pulmonary embolism.  No acute thoracic pathology identified. 2.4 cm right thyroid nodule. This has enlarged since prior sonogram of 05/31/2019. Correlation for history of prior biopsy or repeat thyroid sonogram is recommended for further evaluation. Trace bilateral pleural effusions with minimal bilateral dependent atelectasis. Aortic Atherosclerosis (ICD10-I70.0). Electronically Signed   By: Helyn Numbers MD   On: 12/24/2020 05:14   US RENAL  Result Date: 12/24/2020 CLINICAL DATA:  Bilateral flank pain EXAM: RENAL / URINARY TRACT ULTRASOUND COMPLETE COMPARISON:  None. FINDINGS: Right Kidney: Renal measurements: 12.5 x 5.6 x 5.9 cm = volume: 212.8 mL. Echogenicity within normal limits. No mass or hydronephrosis visualized. Left Kidney: Renal measurements: 12.6 x 5.4 x 4.7 cm = volume: 167.2 mL. Echogenicity within normal limits. No mass or hydronephrosis visualized. Bladder: Appears normal for degree of bladder distention. Other: None. IMPRESSION: Unremarkable renal ultrasound. Electronically Signed   By: Guadlupe Spanish M.D.   On: 12/24/2020 12:11   DG Chest Port 1 View  Result Date: 12/24/2020 CLINICAL DATA:  Dyspnea EXAM: PORTABLE CHEST 1 VIEW COMPARISON:  06/04/2020 FINDINGS: The heart size and mediastinal contours are within normal limits. Both lungs are clear. The visualized skeletal structures are unremarkable. IMPRESSION: No active disease. Electronically Signed   By: Helyn Numbers MD   On: 12/24/2020 01:48    EKG:  Independently reviewed.  Sinus tachycardia with rate 106; nonspecific ST changes with no evidence of acute ischemia   Labs on Admission: I have personally reviewed the available labs and imaging studies at the time of the admission.  Pertinent labs:   Glucose 145 HS troponin 4, 5 WBC 17.4 COVID negative  Assessment/Plan Principal Problem:   COPD with acute exacerbation (HCC) Active Problems:   Complex regional pain syndrome of left lower extremity   S/P AKA (above knee amputation) (HCC)   Sleep apnea   Multiple thyroid nodules   COPD (chronic obstructive pulmonary disease) (HCC)   DNR (do not resuscitate)   Acute on chronic SOB, ?associated with a COPD exacerbation -Patient's shortness of breath, wheezing, and cough are most likely caused by acute COPD exacerbation.  -She has history of O2-dependent COPD and is needing slightly more O2 than baseline  -She sees pulmonology -She does not have fever and has leukocytosis.  -Chest x-ray is not consistent with pneumonia -will admit and attempt to wean [place] O2 back to baseline if possible -Nebulizers:  prn albuterol -Solu-Medrol 125 mg IV x1 -> Prednisone 40 mg PO daily; 5 days of treatment is thought to be sufficient for treatment of acute COPD exacerbations -Continue Trelegy, Flonase -No longer on theophylline (level checked and pending)  OSA -Add CPAP (with O2) qhs to see if this helps  UTI -Reports recent diagnosis of UTI -UA was ordered hours ago and still pending -No c/o urinary symptoms, just B flank pain - so possibly MSK related -Renal US ordered and normal  Chronic pain -I have reviewed this patient in the [place] Controlled Substances Reporting System.  She is receiving medications from only one provider and appears to be taking them as prescribed. -She is not at particularly high risk of opioid misuse, diversion, or overdose. -Continue Neurontin -Rarely has gotten rx for very limited number of Ultram and no other  narcotics prescribed  Mood disorder -Continue Seroquel -She takes Valium 10 mg TID according to PDMP, will continue -She appears to no longer be taking Cymbalta, Lexapro  PVD -s/p L AKA -Stump appears to be well healed  Thyroid nodule -Enlarging nodule -Needs [place] and likely biopsy -Will defer to outpatient PCP for now  HLD -Continue Lipitor  DNR -I have discussed code status with the patient and she would not desire resuscitation and would prefer to die a natural death should that situation arise. -She will need a gold out of facility DNR form at the time of discharge    Note: This patient has been tested and is negative for the novel coronavirus COVID-19. She has been fully vaccinated against COVID-19.    DVT prophylaxis: Lovenox  Code Status:  DNR - confirmed with patient Family Communication: None present Disposition Plan:  The patient is from: home  Anticipated d/c is to: home without [facility] services   Anticipated d/c date will depend on clinical response to treatment, but possibly in the next 2-3 days if she has excellent response to treatment  Patient is currently: acutely ill Consults called:  Nutrition Admission status: Admit - It is my clinical opinion that admission to INPATIENT is reasonable and necessary because this patient will require at least 2 midnights in the hospital to treat this condition based on the medical complexity of the problems presented.  Given the aforementioned information, the predictability of an adverse outcome is felt to be significant.      Jonah Blue MD [facility]   How to contact the [facility] Attending or Consulting provider 7A - 7P or covering provider during after hours 7P -7A, for this patient?  1. Check the care team in [facility] and look for a) attending/consulting [facility] provider listed and b) the [facility] team listed 2. Log into [email or web address] and use [place] universal password to access. If  you do not have the password, please  contact the hospital operator. 3. Locate the [facility] provider you are looking for under [facility] and page to a number that you can be directly reached. 4. If you still have difficulty reaching the provider, please page the [facility] (Director on Call) for the Hospitalists listed on amion for assistance.   12/24/2020, 6:16 PM

## 2020-12-24 NOTE — ED Triage Notes (Signed)
Pt BIB [facility] from home, states she started taking abx for kidney infection today. Tonight she developed increased shortness of breath and chest pain. Pt initially 80% on EMS arrival, applied NRB with improvement.

## 2020-12-24 NOTE — ED Provider Notes (Signed)
[facility]-EMERGENCY DEPT [facility] Emergency Department Provider Note MRN:  [id number]  Arrival date & time: 12/24/20     Chief Complaint   Shortness of Breath   History of Present Illness   Amanda Lewis is a 65 y.o. year-old female with a history of COPD presenting to the ED with chief complaint of shortness of breath.  Shortness of breath that began today, gradually, with some dull central chest pain.  Hypoxic in the 80s with EMS.  Uses 2 L of oxygen chronically at home.  Denies headache or vision change, no recent fever or cough, no numbness or weakness to the arms or legs, no abdominal pain.  Currently on antibiotics for a kidney infection.  Review of Systems  A complete 10 system review of systems was obtained and all systems are negative except as noted in the HPI and PMH.   Patient's Health History    Past Medical History:  Diagnosis Date  . Anemia   . Anxiety    on treatment  . Arthritis    arms  . Asthma    exertional  . Chronic lower back pain   . COPD (chronic obstructive pulmonary disease) (HCC)    usesO2 24/7  . Depression   . Dysuria-frequency syndrome   . GERD (gastroesophageal reflux disease)   . Headache(784.0)    intermittent migraines  . History of kidney stones   . Multiple thyroid nodules   . Pneumonia   . Recurrent upper respiratory infection (URI)   . Rotator cuff tear, right   . RSD lower limb    left lower leg  . Sleep apnea 2010   uses continous oxygen  . Stroke [facility]) 1997    Past Surgical History:  Procedure Laterality Date  . ABDOMINAL HYSTERECTOMY  2003  . AMPUTATION  01/12/2012   Procedure: AMPUTATION ABOVE KNEE;  Surgeon: Nadara Mustard, MD;  Location: [facility] OR;  Service: Orthopedics;  Laterality: Left;  Left Above Knee Amputation  . APPENDECTOMY    . FRACTURE SURGERY    . Morphine Pump    . Morphine Pump Removed  2006  . ORIF ANKLE FRACTURE  1995  . STUMP REVISION Left 07/19/2015   Procedure: Revision Left Above Knee Amputation;   Surgeon: Nadara Mustard, MD;  Location: [facility] OR;  Service: Orthopedics;  Laterality: Left;    Family History  Problem Relation Age of Onset  . Anesthesia problems Neg Hx     Social History   Socioeconomic History  . Marital status: Legally Separated    Spouse name: Not on file  . Number of children: Not on file  . Years of education: Not on file  . Highest education level: Not on file  Occupational History  . Not on file  Tobacco Use  . Smoking status: Former Smoker    Packs/day: 2.00    Years: 35.00    Pack years: 70.00    Types: Cigarettes    Quit date: 09/27/2008    Years since quitting: 12.2  . Smokeless tobacco: Never Used  Substance and Sexual Activity  . Alcohol use: No  . Drug use: No  . Sexual activity: Not Currently    Birth control/protection: Surgical  Other Topics Concern  . Not on file  Social History Narrative  . Not on file   Social Determinants of Health   Financial Resource Strain: Not on file  Food Insecurity: Not on file  Transportation Needs: Not on file  Physical Activity: Not on file  Stress: Not on file  Social Connections: Not on file  Intimate Partner Violence: Not on file     Physical Exam   Vitals:   12/24/20 0440 12/24/20 0505  BP: (!) 116/49 (!) 115/54  Pulse: (!) 108 (!) 105  Resp: (!) 21 16  Temp:    SpO2: 98% 97%    CONSTITUTIONAL: Chronically ill-appearing, NAD NEURO:  Alert and oriented x 3, no focal deficits EYES:  eyes equal and reactive ENT/NECK:  no LAD, no JVD CARDIO: Tachycardic rate, well-perfused, normal S1 and S2 PULM: Tachypneic, faint wheezing, poor air movement GI/GU:  normal bowel sounds, non-distended, non-tender MSK/SPINE:  No gross deformities, no edema SKIN:  no rash, atraumatic PSYCH:  Appropriate speech and behavior  *Additional and/or pertinent findings included in MDM below  Diagnostic and Interventional Summary    EKG Interpretation  Date/Time:  Wednesday December 24 2020 01:25:57  EDT Ventricular Rate:  106 PR Interval:  173 QRS Duration: 101 QT Interval:  340 QTC Calculation: 452 R Axis:   74 Text Interpretation: Sinus tachycardia Confirmed by Kennis Carina [phone]) on 12/24/2020 2:56:46 AM      Labs Reviewed  CBC - Abnormal; Notable for the following components:      Result Value   WBC 17.4 (*)    Hemoglobin 11.5 (*)    MCHC 29.6 (*)    All other components within normal limits  BASIC METABOLIC PANEL - Abnormal; Notable for the following components:   Potassium 3.4 (*)    CO2 38 (*)    Glucose, Bld 145 (*)    All other components within normal limits  RESP PANEL BY RT-PCR (FLU A&B, COVID) ARPGX2  TROPONIN I (HIGH SENSITIVITY)  TROPONIN I (HIGH SENSITIVITY)    CT ANGIO CHEST PE W OR WO CONTRAST  Final Result    DG Chest Port 1 View  Final Result      Medications  methylPREDNISolone sodium succinate (SOLU-MEDROL) 125 mg/2 mL injection 125 mg (125 mg Intravenous Given 12/24/20 0215)  ipratropium-albuterol (DUONEB) 0.5-2.5 (3) MG/3ML nebulizer solution 3 mL (3 mLs Nebulization Given 12/24/20 0222)  ipratropium-albuterol (DUONEB) 0.5-2.5 (3) MG/3ML nebulizer solution 3 mL (3 mLs Nebulization Given 12/24/20 0222)  iohexol (OMNIPAQUE) 350 MG/ML injection 75 mL (75 mLs Intravenous Contrast Given 12/24/20 0451)  fentaNYL (SUBLIMAZE) injection 50 mcg (50 mcg Intravenous Given 12/24/20 0503)  ondansetron (ZOFRAN) injection 4 mg (4 mg Intravenous Given 12/24/20 0502)     Procedures  /  Critical Care Procedures  ED Course and Medical Decision Making  I have reviewed the triage vital signs, the nursing notes, and pertinent available records from the EMR.  Listed above are laboratory and imaging tests that I personally ordered, reviewed, and interpreted and then considered in my medical decision making (see below for details).  Favoring COPD exacerbation given the pulmonary exam, no evidence of DVT.  ACS and PE considered to be unlikely.  Also considering  pneumothorax.  Awaiting chest x-ray, labs.  Will reassess after Solu-Medrol, duo nebs.     Patient's tachycardia, continued chest pain, sedentary lifestyle admitted difficult to exclude PE.  CTA is negative.  Requiring more than her baseline oxygen, will admit to medicine.  Elmer Sow. Pilar Plate, MD [facility] Emergency Medicine [facility] [email or web address]  Final Clinical Impressions(s) / ED Diagnoses     ICD-10-CM   1. COPD exacerbation (HCC)  J44.1     ED Discharge Orders    None       Discharge Instructions  Discussed with and Provided to Patient:   Discharge Instructions   None       Sabas Sous, MD 12/24/20 [phone]

## 2020-12-24 NOTE — ED Notes (Signed)
Attempting to call report to floor. 

## 2020-12-24 NOTE — ED Notes (Signed)
Report given to Lucio Edward, RN of [phone]

## 2020-12-24 NOTE — Progress Notes (Signed)
Patient received to the unit. Patient is alert and oriented x4. Iv in place. Skin assessment done with another nurse. Vital signs are stable. Given instructions about call bell and phone. Bed in low position and call bell in reach.

## 2020-12-24 NOTE — Progress Notes (Signed)
Urine sample able to be collected and sent to lab.

## 2020-12-25 DIAGNOSIS — J441 Chronic obstructive pulmonary disease with (acute) exacerbation: Secondary | ICD-10-CM | POA: Diagnosis not present

## 2020-12-25 LAB — BASIC METABOLIC PANEL
Anion gap: 5 (ref 5–15)
BUN: 12 mg/dL (ref 8–23)
CO2: 40 mmol/L — ABNORMAL HIGH (ref 22–32)
Calcium: 9.2 mg/dL (ref 8.9–10.3)
Chloride: 97 mmol/L — ABNORMAL LOW (ref 98–111)
Creatinine, Ser: 0.51 mg/dL (ref 0.44–1.00)
GFR, Estimated: >60 mL/min (ref >60)
Glucose, Bld: 117 mg/dL — ABNORMAL HIGH (ref 70–99)
Potassium: 3.7 mmol/L (ref 3.5–5.1)
Sodium: 142 mmol/L (ref 135–145)

## 2020-12-25 LAB — CBC
HCT: 33.6 % — ABNORMAL LOW (ref 36.0–46.0)
Hemoglobin: 9.7 g/dL — ABNORMAL LOW (ref 12.0–15.0)
MCH: 28.1 pg (ref 26.0–34.0)
MCHC: 28.9 g/dL — ABNORMAL LOW (ref 30.0–36.0)
MCV: 97.4 fL (ref 80.0–100.0)
Platelets: 243 10*3/uL (ref 150–400)
RBC: 3.45 MIL/uL — ABNORMAL LOW (ref 3.87–5.11)
RDW: 13.9 % (ref 11.5–15.5)
WBC: 12.4 10*3/uL — ABNORMAL HIGH (ref 4.0–10.5)
nRBC: 0 % (ref 0.0–0.2)

## 2020-12-25 LAB — THEOPHYLLINE LEVEL: Theophylline Lvl: <0.8 ug/mL — ABNORMAL LOW (ref 10.0–20.0)

## 2020-12-25 MED ORDER — QUETIAPINE FUMARATE 50 MG PO TABS: 50.0000 mg | ORAL_TABLET | Freq: Two times a day (BID) | ORAL | Status: DC

## 2020-12-25 MED ORDER — BOOST PLUS PO LIQD
237.0000 mL | Freq: Three times a day (TID) | ORAL | Status: DC
  Administered 2020-12-25 – 2020-12-27 (×7): 237 mL via ORAL
  Filled 2020-12-25 (×8): qty 237

## 2020-12-25 MED ORDER — ACETAZOLAMIDE ER 500 MG PO CP12
500.0000 mg | ORAL_CAPSULE | Freq: Two times a day (BID) | ORAL | Status: DC
  Administered 2020-12-25 – 2020-12-26 (×3): 500 mg via ORAL
  Filled 2020-12-25 (×5): qty 1

## 2020-12-25 MED ORDER — SODIUM CHLORIDE 0.9 % IV SOLN: INTRAVENOUS | Status: DC

## 2020-12-25 MED ORDER — ADULT MULTIVITAMIN W/MINERALS CH
1.0000 | ORAL_TABLET | Freq: Every day | ORAL | Status: DC
  Administered 2020-12-25 – 2020-12-27 (×3): 1 via ORAL
  Filled 2020-12-25 (×3): qty 1

## 2020-12-25 MED ORDER — GABAPENTIN 300 MG PO CAPS
300.0000 mg | ORAL_CAPSULE | Freq: Every day | ORAL | Status: DC
  Administered 2020-12-25: 300 mg via ORAL
  Filled 2020-12-25: qty 1

## 2020-12-25 NOTE — Progress Notes (Signed)
Initial Nutrition Assessment  DOCUMENTATION CODES:   Not applicable  INTERVENTION:    Boost Plus PO once daily, each supplement provides 360 kcal and 14 gm protein.  MVI with minerals daily  NUTRITION DIAGNOSIS:   Increased nutrient needs related to chronic illness (COPD) as evidenced by estimated needs.  GOAL:   Patient will meet greater than or equal to 90% of their needs  MONITOR:   PO intake,Supplement acceptance  REASON FOR ASSESSMENT:   Consult Assessment of nutrition requirement/status  ASSESSMENT:   65 yo female admitted with SOB, COPD exacerbaton. PMH includes stroke, asthma, COPD, anemia, GERD, depression, sleep apnea, PVD S/P L AKA.  Patient reports that she has been eating well. She eats 3 meals at home, consisting of eggs and jelly at breakfast, chicken in the afternoon, and a bouillon cube for supper. Seems inadequate, however, no weight loss reported and NFPE is WNL. Appetite is good. Meal intakes not documented. She states that she ate 100% of breakfast today and ordered a good lunch and supper. She requests chocolate Boost at bedtime daily.   Labs reviewed.  Medications reviewed and include Colace, prednisone. IVF: LR at 75 ml/h  No recent weight history available for review. Patient unsure of usual weight, but doesn't think she has lost any weight.  NUTRITION - FOCUSED PHYSICAL EXAM:  Flowsheet Row Most Recent Value  Orbital Region No depletion  Upper Arm Region No depletion  Thoracic and Lumbar Region No depletion  Buccal Region No depletion  Temple Region No depletion  Clavicle Bone Region No depletion  Clavicle and Acromion Bone Region No depletion  Scapular Bone Region No depletion  Dorsal Hand No depletion  Patellar Region No depletion  Anterior Thigh Region No depletion  Posterior Calf Region No depletion  Edema (RD Assessment) None  Hair Reviewed  Eyes Reviewed  Mouth Reviewed  Skin Reviewed  Nails Reviewed       Diet Order:    Diet Order            Diet Heart Room service appropriate? Yes; Fluid consistency: Thin  Diet effective now                 EDUCATION NEEDS:   No education needs have been identified at this time  Skin:  Skin Assessment: Reviewed RN Assessment  Last BM:  4/25  Height:   Ht Readings from Last 1 Encounters:  12/24/20 5\' 5"  (1.651 m)    Weight:   Wt Readings from Last 1 Encounters:  12/24/20 73 kg    Ideal Body Weight:  52.3 kg (adjusted for AKA)  BMI:  29.1 (adjusted for AKA)  Estimated Nutritional Needs:   Kcal:  1800-2000  Protein:  85-100 gm  Fluid:  >/= 1.8 L    12/26/20, RD, LDN, CNSC Please refer to Amion for contact information.

## 2020-12-25 NOTE — Progress Notes (Signed)
PROGRESS NOTE   Amanda Lewis  XLK:[id number] DOB: 10/13/1955 DOA: 12/24/2020 PCP: Galvin Proffer, MD  Brief Narrative:   78 white home dwelling female Complex regional pain syndrome left leg in the past followed by Dr. Kendrick Fries with dehiscence of wound in 2016 OSA/COPD/2 L O2 at baseline@home -follows with Dr. Marcellus Scott PVD with left AKA and chronic back pain  DOE, SOB X several days with regular ambulation- CXR = no pneumonia-Rx Solu-Medrol X1 Renal US ordered Also found to have enlarging thyroid nodule on eval  Hospital-Problem based course Acute metabolic encephalopathy query cause Combination of respiratory alkalosis with compensation versus polypharmacy cut back all offending agents-- Start Diamox every 12 Watch mental state Decompensated acute exacerbation of COPD Gold stage II Continue prednisone 40 daily, albuterol 2.5 every 2 as needed, Incruse Ellipta, Breo Ellipta 1 daily Would hold theophylline at this time given narrow therapeutic index and this can be discussed with her pulmonologist in the outpatient setting Complex regional pain syndrome in the past followed by Dr. Lajoyce Corners with history of AKA Continue Cymbalta 60, Lexapro 10, cut back gabapentin 600 3 times daily--100 qhs,  stop oxycodone 1 tab every 4 as needed Stop Seroquel twice daily until more awake OSA on BiPAP/CPAP Continue nightly CPAP HFpEF? Holding Lasix at this time-outpatient echocardiogram to be done Thyroid nodule Requires outpatient characterization TSH and T4 ?  UTI Follow renal ultrasound  DVT prophylaxis: Lovenox Code Status: DNR Family Communication: Dorene Sorrow discussed [phone] Disposition:  Status is: Inpatient  Remains inpatient appropriate because:Hemodynamically unstable, Altered mental status, Ongoing diagnostic testing needed not appropriate for outpatient work up, Unsafe d/c plan and IV treatments appropriate due to intensity of illness or inability to take PO   Dispo: The  patient is from: Home              Anticipated d/c is to: Home              Patient currently is not medically stable to d/c.   Difficult to place patient Yes  Consultants:   None at present  Procedures: No  Antimicrobials: No   Subjective: Awake but not very coherent-thinks this is March 2021 cannot tell me the season thinks it is winter and seems quite disoriented to environment Knows that she is in the hospital Tells me she still has pain in her lower back No fever or chills   Objective: Vitals:   12/24/20 2029 12/25/20 0048 12/25/20 0500 12/25/20 0823  BP: (!) 95/57 (!) 104/52 (!) 91/53 (!) 120/52  Pulse: 98 93 88 87  Resp: 16 18 18 18   Temp: 98.5 F (36.9 C) 99.1 F (37.3 C) 98.4 F (36.9 C) 97.7 F (36.5 C)  TempSrc: Oral Oral Oral Oral  SpO2: 97% 97% 97% 96%  Weight:      Height:        Intake/Output Summary (Last 24 hours) at 12/25/2020 1033 Last data filed at 12/25/2020 0700 Gross per 24 hour  Intake 1700.52 ml  Output 801 ml  Net 899.52 ml   Filed Weights   12/24/20 0114  Weight: 73 kg    Examination:  Incoherent chronically ill-appearing white female thick neck Mallampati 4 CTA B no added sound no rales no rhonchi S1-S2 no murmur no rub no gallop RRR on monitors Abdomen soft nontender nondistended no rebound no guarding Neurologically intact moving 3 limbs equally Note left AKA on that side   Data Reviewed: personally reviewed   Data Hemoglobin 11.5-->9.7 WBC 17.4-->12.4 CO2  38-->40 BUN/creatinine 12/0.5   CBCRadiology Studies: CT ANGIO CHEST PE W OR WO CONTRAST  Result Date: 12/24/2020 CLINICAL DATA:  Chest pain, dyspnea EXAM: CT ANGIOGRAPHY CHEST WITH CONTRAST TECHNIQUE: Multidetector CT imaging of the chest was performed using the standard protocol during bolus administration of intravenous contrast. Multiplanar CT image reconstructions and MIPs were obtained to evaluate the vascular anatomy. CONTRAST:  65mL OMNIPAQUE IOHEXOL 350  MG/ML SOLN COMPARISON:  06/04/2020 FINDINGS: Cardiovascular: There is adequate opacification of the pulmonary arterial tree. No intraluminal filling defect identified to suggest acute pulmonary embolism. The central pulmonary arteries are of normal caliber. No significant coronary artery calcification. Global cardiac size within normal limits. No pericardial effusion. Mild atherosclerotic calcification within the thoracic aorta. No aortic aneurysm. Mediastinum/Nodes: Stable 2.4 cm nodule within the right thyroid lobe, better assessed on prior sonogram of 05/31/2019. This has, however, increased in size since that examination where it measured 1.8 cm in greatest dimension. No pathologic thoracic adenopathy. The esophagus is unremarkable. Lungs/Pleura: Trace bilateral pleural effusions. Bibasilar atelectasis. No focal pulmonary nodules or infiltrates. No pneumothorax. Central airways are widely patent. Upper Abdomen: No acute abnormality. Musculoskeletal: No acute bone abnormality. No lytic or blastic bone lesion. Review of the MIP images confirms the above findings. IMPRESSION: No pulmonary embolism.  No acute thoracic pathology identified. 2.4 cm right thyroid nodule. This has enlarged since prior sonogram of 05/31/2019. Correlation for history of prior biopsy or repeat thyroid sonogram is recommended for further evaluation. Trace bilateral pleural effusions with minimal bilateral dependent atelectasis. Aortic Atherosclerosis (ICD10-I70.0). Electronically Signed   By: Helyn Numbers MD   On: 12/24/2020 05:14   US RENAL  Result Date: 12/24/2020 CLINICAL DATA:  Bilateral flank pain EXAM: RENAL / URINARY TRACT ULTRASOUND COMPLETE COMPARISON:  None. FINDINGS: Right Kidney: Renal measurements: 12.5 x 5.6 x 5.9 cm = volume: 212.8 mL. Echogenicity within normal limits. No mass or hydronephrosis visualized. Left Kidney: Renal measurements: 12.6 x 5.4 x 4.7 cm = volume: 167.2 mL. Echogenicity within normal limits. No  mass or hydronephrosis visualized. Bladder: Appears normal for degree of bladder distention. Other: None. IMPRESSION: Unremarkable renal ultrasound. Electronically Signed   By: Guadlupe Spanish M.D.   On: 12/24/2020 12:11   DG Chest Port 1 View  Result Date: 12/24/2020 CLINICAL DATA:  Dyspnea EXAM: PORTABLE CHEST 1 VIEW COMPARISON:  06/04/2020 FINDINGS: The heart size and mediastinal contours are within normal limits. Both lungs are clear. The visualized skeletal structures are unremarkable. IMPRESSION: No active disease. Electronically Signed   By: Helyn Numbers MD   On: 12/24/2020 01:48     Scheduled Meds: . atorvastatin  80 mg Oral q1800  . docusate sodium  100 mg Oral BID  . enoxaparin (LOVENOX) injection  40 mg Subcutaneous Q24H  . fluticasone  2 spray Each Nare Daily  . umeclidinium bromide  1 puff Inhalation Daily   And  . fluticasone furoate-vilanterol  1 puff Inhalation Daily  . gabapentin  300 mg Oral TID  . lactose free nutrition  237 mL Oral TID WC  . multivitamin with minerals  1 tablet Oral Daily  . pantoprazole  40 mg Oral Daily  . predniSONE  40 mg Oral Q breakfast  . QUEtiapine  100 mg Oral BID   Continuous Infusions:    LOS: 1 day   Time spent: 42  Rhetta Mura, MD [facility] To contact the attending provider between 7A-7P or the covering provider during after hours 7P-7A, please log into the web site [email or web address]  and access using universal [place] password for that web site. If you do not have the password, please call the hospital operator.  12/25/2020, 10:33 AM

## 2020-12-25 NOTE — Progress Notes (Signed)
RT attempted to place CPAP on patient.  Patient tolerated for a couple minutes and then wanted it off stating it was too much pressure.  RT had it on the lowest pressure of 5.  Patient placed back on 2L [place].  RT will continue to monitor.

## 2020-12-25 NOTE — Hospital Course (Addendum)
   Data  CO2 38-->40-->26 BUN/creatinine 12/0.5--currently on 10/0.4

## 2020-12-26 DIAGNOSIS — J441 Chronic obstructive pulmonary disease with (acute) exacerbation: Secondary | ICD-10-CM | POA: Diagnosis not present

## 2020-12-26 LAB — RENAL FUNCTION PANEL
Albumin: 3.1 g/dL — ABNORMAL LOW (ref 3.5–5.0)
Anion gap: 5 (ref 5–15)
BUN: 9 mg/dL (ref 8–23)
CO2: 31 mmol/L (ref 22–32)
Calcium: 9.5 mg/dL (ref 8.9–10.3)
Chloride: 106 mmol/L (ref 98–111)
Creatinine, Ser: 0.52 mg/dL (ref 0.44–1.00)
GFR, Estimated: >60 mL/min (ref >60)
Glucose, Bld: 129 mg/dL — ABNORMAL HIGH (ref 70–99)
Phosphorus: 2 mg/dL — ABNORMAL LOW (ref 2.5–4.6)
Potassium: 3.6 mmol/L (ref 3.5–5.1)
Sodium: 142 mmol/L (ref 135–145)

## 2020-12-26 LAB — URINE CULTURE

## 2020-12-26 MED ORDER — ACETAZOLAMIDE ER 500 MG PO CP12
500.0000 mg | ORAL_CAPSULE | Freq: Every day | ORAL | Status: DC
  Administered 2020-12-27: 500 mg via ORAL
  Filled 2020-12-26: qty 1

## 2020-12-26 MED ORDER — QUETIAPINE FUMARATE 25 MG PO TABS
25.0000 mg | ORAL_TABLET | Freq: Two times a day (BID) | ORAL | Status: DC
  Administered 2020-12-26 – 2020-12-27 (×3): 25 mg via ORAL
  Filled 2020-12-26 (×3): qty 1

## 2020-12-26 MED ORDER — GABAPENTIN 300 MG PO CAPS
600.0000 mg | ORAL_CAPSULE | Freq: Two times a day (BID) | ORAL | Status: DC
  Administered 2020-12-26 – 2020-12-27 (×3): 600 mg via ORAL
  Filled 2020-12-26 (×3): qty 2

## 2020-12-26 MED ORDER — ALUM & MAG HYDROXIDE-SIMETH 200-200-20 MG/5ML PO SUSP: 30.0000 mL | Freq: Four times a day (QID) | ORAL | Status: DC | PRN

## 2020-12-26 MED ORDER — DULOXETINE HCL 60 MG PO CPEP
60.0000 mg | ORAL_CAPSULE | Freq: Every day | ORAL | Status: DC
  Administered 2020-12-26 – 2020-12-27 (×2): 60 mg via ORAL
  Filled 2020-12-26 (×2): qty 1

## 2020-12-26 NOTE — Progress Notes (Signed)
PROGRESS NOTE   Amanda Lewis  OVZ:[id number] DOB: [birth year] DOA: 12/24/2020 PCP: Galvin Proffer, MD  Brief Narrative:   19 white home dwelling female Complex regional pain syndrome left leg in the past followed by Dr. Kendrick Fries with dehiscence of wound in 2016 OSA/COPD/2 L O2 at baseline@home -follows with Dr. Marcellus Scott PVD with left AKA and chronic back pain  DOE, SOB X several days with regular ambulation- CXR = no pneumonia-Rx Solu-Medrol X1 Renal US ordered Also found to have enlarging thyroid nodule on eval  Hospital-Problem based course Acute metabolic encephalopathy query cause Combination of respiratory alkalosis and lack of sleep while in hospital Diamox adjusted downwards to 500 daily given improvement in mentation-we will need to discharge on this Decompensated acute exacerbation of COPD Gold stage II Continue prednisone 40 daily, albuterol 2.5 every 2 as needed, Incruse Ellipta, Breo Ellipta 1 daily DC theophylline at this time given narrow therapeutic index medication-outpatient pulmonary input Complex regional pain syndrome in the past followed by Dr. Lajoyce Corners with history of AKA Slowly resuming meds from prior to admission as per orders Will resume all home meds on discharge OSA on BiPAP/CPAP Continue nightly CPAP HFpEF? Holding Lasix at this time-outpatient echocardiogram to be done Thyroid nodule Requires outpatient characterization TSH and T4 ?  UTI Renal ultrasound 4/27 negative for any [place] or other concerns  DVT prophylaxis: Lovenox Code Status: DNR Family Communication: Dorene Sorrow discussed [phone] Disposition:  Status is: Inpatient  Remains inpatient appropriate because:Hemodynamically unstable, Altered mental status, Ongoing diagnostic testing needed not appropriate for outpatient work up, Unsafe d/c plan and IV treatments appropriate due to intensity of illness or inability to take PO   Dispo: The patient is from: Home              Anticipated d/c  is to: Home              Patient currently is not medically stable to d/c.   Difficult to place patient Yes  Consultants:   None at present  Procedures: No  Antimicrobials: No   Subjective:  Awake coherent much more oriented No chest pain no fever no chills    Objective: Vitals:   12/25/20 1952 12/25/20 2345 12/26/20 0400 12/26/20 1231  BP: 124/64 (!) 143/68 137/67 139/67  Pulse: 86 82 87 95  Resp: 16 19 18 16   Temp: 97.8 F (36.6 C) 97.7 F (36.5 C) 98.2 F (36.8 C) 98.4 F (36.9 C)  TempSrc: Oral Oral Oral Oral  SpO2: 99% 98% 97% 95%  Weight:      Height:        Intake/Output Summary (Last 24 hours) at 12/26/2020 1722 Last data filed at 12/26/2020 1618 Gross per 24 hour  Intake 2703.59 ml  Output 5250 ml  Net -2546.41 ml   Filed Weights   12/24/20 0114  Weight: 73 kg    Examination:  Incoherent chronically ill-appearing white female thick neck Mallampati 4 much more oriented CTA B mild wheeze S1-S2 no murmur no rub no gallop RRR on monitors Abdomen soft nontender nondistended no rebound no guarding Neurologically intact moving 3 limbs equally Note left AKA on that side   Data Reviewed: personally reviewed   Data CO2 38-->40-->31 BUN/creatinine 12/0.5-->9/0.5   CBCRadiology Studies: No results found.   Scheduled Meds: . acetaZOLAMIDE  500 mg Oral Q12H  . atorvastatin  80 mg Oral q1800  . docusate sodium  100 mg Oral BID  . DULoxetine  60 mg Oral Daily  .  enoxaparin (LOVENOX) injection  40 mg Subcutaneous Q24H  . fluticasone  2 spray Each Nare Daily  . umeclidinium bromide  1 puff Inhalation Daily   And  . fluticasone furoate-vilanterol  1 puff Inhalation Daily  . gabapentin  600 mg Oral BID  . lactose free nutrition  237 mL Oral TID WC  . multivitamin with minerals  1 tablet Oral Daily  . pantoprazole  40 mg Oral Daily  . predniSONE  40 mg Oral Q breakfast  . QUEtiapine  25 mg Oral BID   Continuous Infusions: . sodium chloride 50  mL/hr at 12/26/20 0945     LOS: 2 days   Time spent: 28  Rhetta Mura, MD [facility] To contact the attending provider between 7A-7P or the covering provider during after hours 7P-7A, please log into the web site [email or web address] and access using universal [place] password for that web site. If you do not have the password, please call the hospital operator.  12/26/2020, 5:22 PM

## 2020-12-27 DIAGNOSIS — J441 Chronic obstructive pulmonary disease with (acute) exacerbation: Secondary | ICD-10-CM | POA: Diagnosis not present

## 2020-12-27 LAB — RENAL FUNCTION PANEL
Albumin: 3.1 g/dL — ABNORMAL LOW (ref 3.5–5.0)
Anion gap: 6 (ref 5–15)
BUN: 10 mg/dL (ref 8–23)
CO2: 26 mmol/L (ref 22–32)
Calcium: 9.8 mg/dL (ref 8.9–10.3)
Chloride: 110 mmol/L (ref 98–111)
Creatinine, Ser: 0.47 mg/dL (ref 0.44–1.00)
GFR, Estimated: >60 mL/min (ref >60)
Glucose, Bld: 98 mg/dL (ref 70–99)
Phosphorus: 2.6 mg/dL (ref 2.5–4.6)
Potassium: 3.7 mmol/L (ref 3.5–5.1)
Sodium: 142 mmol/L (ref 135–145)

## 2020-12-27 MED ORDER — OXYCODONE-ACETAMINOPHEN 5-325 MG PO TABS: 1.0000 | ORAL_TABLET | Freq: Three times a day (TID) | ORAL | 0 refills | Status: DC | PRN

## 2020-12-27 MED ORDER — ATORVASTATIN CALCIUM 40 MG PO TABS: 80.0000 mg | ORAL_TABLET | Freq: Every day | ORAL | 3 refills | Status: DC

## 2020-12-27 MED ORDER — PREDNISONE 20 MG PO TABS: 40.0000 mg | ORAL_TABLET | Freq: Every day | ORAL | 0 refills | Status: DC

## 2020-12-27 MED ORDER — GABAPENTIN 300 MG PO CAPS: 600.0000 mg | ORAL_CAPSULE | Freq: Two times a day (BID) | ORAL | 2 refills | Status: DC

## 2020-12-27 MED ORDER — ACETAZOLAMIDE ER 500 MG PO CP12: 500.0000 mg | ORAL_CAPSULE | Freq: Every day | ORAL | 0 refills | Status: AC

## 2020-12-27 MED ORDER — HYDROCHLOROTHIAZIDE 25 MG PO TABS: 12.5000 mg | ORAL_TABLET | Freq: Every day | ORAL | Status: DC

## 2020-12-27 NOTE — TOC Transition Note (Signed)
Transition of Care [facility]) - CM/SW Discharge Note   Patient Details  Name: GETHSEMANE FISCHLER MRN: [id number] Date of Birth: 1956/04/28  [facility]) CM/SW Contact:  Kermit Balo, RN Phone Number: 12/27/2020, 10:57 AM   Clinical Narrative:    Patient discharging home with self care. CM went by the room and pt was sleeping. CM spoke with bedside RN and she states the patient said she has all needed DME at home and family provides needed assistance.  CM inquired about home oxygen and transport tank. Bedside RN says she will remind the family to bring.  No further needs per CM.   Final next level of care: Home/Self Care Barriers to Discharge: No Barriers Identified   Patient Goals and CMS Choice        Discharge Placement                       Discharge Plan and Services                                     Social Determinants of Health (SDOH) Interventions     Readmission Risk Interventions No flowsheet data found.

## 2020-12-27 NOTE — Discharge Summary (Signed)
Physician Discharge Summary  Amanda LaundryMona B Daffin ZOX:[id number] DOB: May 07, 1956 DOA: 12/24/2020  PCP: Galvin ProfferHague, Imran P, MD  Admit date: 12/24/2020 Discharge date: 12/27/2020  Time spent: 36 minutes  Recommendations for Outpatient Follow-up:  1. Medication changes-decreased gabapentin, changed dosing of oxycodone and decreased-discontinued theophylline 2. Consider resumption Lasix in the outpatient setting-resumed low-dose HCTZ on discharge 3. Added Diamox, prednisone 4. Needs Chem-12 in 1 week to check CO2 5. Needs outpatient visit with pulmonologist to discuss theophylline use 6. Will continue chronic oxygen at home 7. Will need outpatient thyroid nodule evaluation-already has a scheduled apparently with ENT  Discharge Diagnoses:  MAIN problem for hospitalization   Acute metabolic encephalopathy from polypharmacy, metabolic alkalosis  Please see below for itemized issues addressed in HOpsital- refer to other progress notes for clarity if needed  Discharge Condition: Improved  Diet recommendation: Heart healthy  Filed Weights   12/24/20 0114  Weight: 73 kg    History of present illness:    65 white home dwelling female Complex regional pain syndrome left leg in the past followed by Dr. Kendrick Friesuda AKA with dehiscence of wound in 2016 OSA/COPD/2 L O2 at baseline@home -follows with Dr. Marcellus Scottanvir Chodri PVD with left AKA and chronic back pain  DOE, SOB X several days with regular ambulation- CXR = no pneumonia-Rx Solu-Medrol X1 Renal US ordered Also found to have enlarging thyroid nodule on eval  Hospital Course:  Acute metabolic encephalopathy query cause Combination of respiratory alkalosis and lack of sleep while in hospital Diamox adjusted downwards to 500 daily given improvement in mentation-we will need to discharge on this--she will need labs in a week to determine next steps with regards to whether this should be continued in the outpatient setting Decompensated acute exacerbation of  COPD Gold stage II Continue prednisone 40 daily, albuterol 2.5 every 2 as needed, Incruse Ellipta, Breo Ellipta 1 daily DC theophylline at this time given narrow therapeutic index medication-outpatient pulmonary input required prior to resumption Complex regional pain syndrome in the past followed by Dr. Lajoyce Cornersuda with history of AKA Meds were initially held on admission given acute encephalopathy These were resumed at lower doses on discharge See MAR OSA on BiPAP/CPAP Continue nightly CPAP HFpEF? Lasix discontinued at this time HCTZ resumed at lower dose Thyroid nodule Requires outpatient follow-up with ENT which has already apparently been set up ?  UTI Renal ultrasound 4/27 negative for any pyelo   Discharge Exam: Vitals:   12/27/20 0443 12/27/20 0731  BP: 124/65   Pulse: 78   Resp: 19   Temp: 98.7 F (37.1 C)   SpO2: 96% 97%    Subj on day of d/c   Awake alert coherent still feels little stuffed up but otherwise eating drinking coherent x3 No distress Tells me has not walked in 3 years-uses a wheelchair at baseline Compensates well Chronic oxygen  General Exam on discharge  EOMI NCAT on nasal cannula Mallampati 4 No icterus no pallor Neck soft supple S1-S2 sinus, sinus bradycardia on monitors Mild wheeze no rales no rhonchi decreased air entry posterolaterally Left AKA Right side knee contracture noted  Discharge Instructions   Discharge Instructions    Diet - low sodium heart healthy   Complete by: As directed    Discharge instructions   Complete by: As directed    Please make sure that you look at your list of medications carefully The main changes are that we will give you prednisone for your COPD in addition to a new medication called Diamox which  is for some disturbances on your labs-we think that the lab disturbances could have caused you to be confused Please talk to Dr. Delbert Phenix your lung doctor about your theophylline which we have discontinued We  have also cut back to some degree some of your pain meds in addition to your gabapentin and changed some doses of these meds-please discuss this with your primary physician If you experience shortness of breath, severe confusion or other issues please present to the emergency room--- your primary physician should discuss coming off of some of the medications that you have been on as they can be risky to take when you   have COPD have a good summer and take care   Increase activity slowly   Complete by: As directed      Allergies as of 12/27/2020      Reactions   Aspirin Hives   Bactrim [sulfamethoxazole-trimethoprim] Hives   Nitroglycerin Other (See Comments)   Blood pressure drops dangerously low   Oxycodone-aspirin Hives   Takes percocet without problems   Percodan [oxycodone-aspirin] Hives   Takes percocet without problems   Sulfamethoxazole-trimethoprim Hives   Tape Rash   Adhesive      Medication List    STOP taking these medications   escitalopram 10 MG tablet Commonly known as: LEXAPRO   furosemide 40 MG tablet Commonly known as: LASIX   SUMAtriptan 100 MG tablet Commonly known as: IMITREX   theophylline 300 MG 12 hr tablet Commonly known as: THEODUR   traMADol 50 MG tablet Commonly known as: ULTRAM     TAKE these medications   acetaZOLAMIDE 500 MG capsule Commonly known as: DIAMOX Take 1 capsule (500 mg total) by mouth daily. Start taking on: Dec 28, 2020   albuterol 108 (90 Base) MCG/ACT inhaler Commonly known as: VENTOLIN HFA Inhale 2 puffs into the lungs every 6 (six) hours as needed. For shortness of breath   alendronate 70 MG tablet Commonly known as: FOSAMAX Take 70 mg by mouth once a week.   Anoro Ellipta 62.5-25 MCG/INH Aepb Generic drug: umeclidinium-vilanterol Inhale 1 puff into the lungs daily.   atorvastatin 40 MG tablet Commonly known as: LIPITOR Take 2 tablets (80 mg total) by mouth daily at 6 PM. What changed:   how much to  take  when to take this  Another medication with the same name was removed. Continue taking this medication, and follow the directions you see here.   DULoxetine 60 MG capsule Commonly known as: CYMBALTA Take 60 mg by mouth daily.   fluticasone 50 MCG/ACT nasal spray Commonly known as: FLONASE Place 2 sprays into both nostrils daily.   gabapentin 300 MG capsule Commonly known as: NEURONTIN Take 2 capsules (600 mg total) by mouth 2 (two) times daily. What changed: when to take this   hydrochlorothiazide 25 MG tablet Commonly known as: HYDRODIURIL Take 0.5 tablets (12.5 mg total) by mouth daily. What changed: how much to take   loratadine 10 MG tablet Commonly known as: CLARITIN Take 10 mg by mouth daily.   Myrbetriq 50 MG Tb24 tablet Generic drug: mirabegron ER Take 50 mg by mouth daily.   omeprazole 40 MG capsule Commonly known as: PRILOSEC Take 40 mg by mouth daily.   oxyCODONE-acetaminophen 5-325 MG tablet Commonly known as: Roxicet Take 1 tablet by mouth every 8 (eight) hours as needed for severe pain. What changed: when to take this   predniSONE 20 MG tablet Commonly known as: DELTASONE Take 2 tablets (40 mg total)  by mouth daily with breakfast. Start taking on: Dec 28, 2020   QUEtiapine 25 MG tablet Commonly known as: SEROQUEL Take 25 mg by mouth 2 (two) times daily.   Trelegy Ellipta 100-62.5-25 MCG/INH Aepb Generic drug: Fluticasone-Umeclidin-Vilant Inhale 1 puff into the lungs daily.      Allergies  Allergen Reactions  . Aspirin Hives  . Bactrim [Sulfamethoxazole-Trimethoprim] Hives  . Nitroglycerin Other (See Comments)    Blood pressure drops dangerously low  . Oxycodone-Aspirin Hives    Takes percocet without problems  . Percodan [Oxycodone-Aspirin] Hives    Takes percocet without problems  . Sulfamethoxazole-Trimethoprim Hives  . Tape Rash    Adhesive      The results of significant diagnostics from this hospitalization (including  imaging, microbiology, ancillary and laboratory) are listed below for reference.    Significant Diagnostic Studies: CT ANGIO CHEST PE W OR WO CONTRAST  Result Date: 12/24/2020 CLINICAL DATA:  Chest pain, dyspnea EXAM: CT ANGIOGRAPHY CHEST WITH CONTRAST TECHNIQUE: Multidetector CT imaging of the chest was performed using the standard protocol during bolus administration of intravenous contrast. Multiplanar CT image reconstructions and MIPs were obtained to evaluate the vascular anatomy. CONTRAST:  15mL OMNIPAQUE IOHEXOL 350 MG/ML SOLN COMPARISON:  06/04/2020 FINDINGS: Cardiovascular: There is adequate opacification of the pulmonary arterial tree. No intraluminal filling defect identified to suggest acute pulmonary embolism. The central pulmonary arteries are of normal caliber. No significant coronary artery calcification. Global cardiac size within normal limits. No pericardial effusion. Mild atherosclerotic calcification within the thoracic aorta. No aortic aneurysm. Mediastinum/Nodes: Stable 2.4 cm nodule within the right thyroid lobe, better assessed on prior sonogram of 05/31/2019. This has, however, increased in size since that examination where it measured 1.8 cm in greatest dimension. No pathologic thoracic adenopathy. The esophagus is unremarkable. Lungs/Pleura: Trace bilateral pleural effusions. Bibasilar atelectasis. No focal pulmonary nodules or infiltrates. No pneumothorax. Central airways are widely patent. Upper Abdomen: No acute abnormality. Musculoskeletal: No acute bone abnormality. No lytic or blastic bone lesion. Review of the MIP images confirms the above findings. IMPRESSION: No pulmonary embolism.  No acute thoracic pathology identified. 2.4 cm right thyroid nodule. This has enlarged since prior sonogram of 05/31/2019. Correlation for history of prior biopsy or repeat thyroid sonogram is recommended for further evaluation. Trace bilateral pleural effusions with minimal bilateral dependent  atelectasis. Aortic Atherosclerosis (ICD10-I70.0). Electronically Signed   By: Helyn Numbers MD   On: 12/24/2020 05:14   US RENAL  Result Date: 12/24/2020 CLINICAL DATA:  Bilateral flank pain EXAM: RENAL / URINARY TRACT ULTRASOUND COMPLETE COMPARISON:  None. FINDINGS: Right Kidney: Renal measurements: 12.5 x 5.6 x 5.9 cm = volume: 212.8 mL. Echogenicity within normal limits. No mass or hydronephrosis visualized. Left Kidney: Renal measurements: 12.6 x 5.4 x 4.7 cm = volume: 167.2 mL. Echogenicity within normal limits. No mass or hydronephrosis visualized. Bladder: Appears normal for degree of bladder distention. Other: None. IMPRESSION: Unremarkable renal ultrasound. Electronically Signed   By: Guadlupe Spanish M.D.   On: 12/24/2020 12:11   DG Chest Port 1 View  Result Date: 12/24/2020 CLINICAL DATA:  Dyspnea EXAM: PORTABLE CHEST 1 VIEW COMPARISON:  06/04/2020 FINDINGS: The heart size and mediastinal contours are within normal limits. Both lungs are clear. The visualized skeletal structures are unremarkable. IMPRESSION: No active disease. Electronically Signed   By: Helyn Numbers MD   On: 12/24/2020 01:48    Microbiology: Recent Results (from the past 240 hour(s))  Resp Panel by RT-PCR (Flu A&B, Covid) Nasopharyngeal Swab  Status: None   Collection Time: 12/24/20  1:30 AM   Specimen: Nasopharyngeal Swab; Nasopharyngeal(NP) swabs in vial transport medium  Result Value Ref Range Status   SARS Coronavirus 2 by RT PCR NEGATIVE NEGATIVE Final    Comment: (NOTE) SARS-CoV-2 target nucleic acids are NOT DETECTED.  The SARS-CoV-2 RNA is generally detectable in upper respiratory specimens during the acute phase of infection. The lowest concentration of SARS-CoV-2 viral copies this assay can detect is 138 copies/mL. A negative result does not preclude SARS-Cov-2 infection and should not be used as the sole basis for treatment or other patient management decisions. A negative result may occur with   improper specimen collection/handling, submission of specimen other than nasopharyngeal swab, presence of viral mutation(s) within the areas targeted by this assay, and inadequate number of viral copies(<138 copies/mL). A negative result must be combined with clinical observations, patient history, and epidemiological information. The expected result is Negative.  Fact Sheet for Patients:  BloggerCourse.com  Fact Sheet for Healthcare Providers:  SeriousBroker.it  This test is no t yet approved or cleared by the [place] FDA and  has been authorized for detection and/or diagnosis of SARS-CoV-2 by FDA under an Emergency Use Authorization (EUA). This EUA will remain  in effect (meaning this test can be used) for the duration of the COVID-19 declaration under Section 564(b)(1) of the Act, 21 U.S.C.section 360bbb-3(b)(1), unless the authorization is terminated  or revoked sooner.       Influenza A by PCR NEGATIVE NEGATIVE Final   Influenza B by PCR NEGATIVE NEGATIVE Final    Comment: (NOTE) The Xpert Xpress SARS-CoV-2/FLU/RSV plus assay is intended as an aid in the diagnosis of influenza from Nasopharyngeal swab specimens and should not be used as a sole basis for treatment. Nasal washings and aspirates are unacceptable for Xpert Xpress SARS-CoV-2/FLU/RSV testing.  Fact Sheet for Patients: BloggerCourse.com  Fact Sheet for Healthcare Providers: SeriousBroker.it  This test is not yet approved or cleared by the [place] FDA and has been authorized for detection and/or diagnosis of SARS-CoV-2 by FDA under an Emergency Use Authorization (EUA). This EUA will remain in effect (meaning this test can be used) for the duration of the COVID-19 declaration under Section 564(b)(1) of the Act, 21 U.S.C. section 360bbb-3(b)(1), unless the authorization is terminated  or revoked.  Performed at [facility], [address] [place], [place] [address]   Urine culture     Status: Abnormal   Collection Time: 12/24/20  7:42 PM   Specimen: Urine, Random  Result Value Ref Range Status   Specimen Description URINE, RANDOM  Final   Special Requests   Final    NONE Performed at [facility], [address], [place], [place] [address]    Culture MULTIPLE SPECIES PRESENT, SUGGEST RECOLLECTION (A)  Final   Report Status 12/26/2020 FINAL  Final     Labs: Basic Metabolic Panel: Recent Labs  Lab 12/24/20 0116 12/25/20 0423 12/26/20 0958 12/27/20 0356  NA 143 142 142 142  K 3.4* 3.7 3.6 3.7  CL 100 97* 106 110  CO2 38* 40* 31 26  GLUCOSE 145* 117* 129* 98  BUN CREATININE 0.52 0.51 0.52 0.47  CALCIUM 9.7 9.2 9.5 9.8  PHOS  --   --  2.0* 2.6   Liver Function Tests: Recent Labs  Lab 12/26/20 0958 12/27/20 0356  ALBUMIN 3.1* 3.1*   No results for input(s): LIPASE, AMYLASE in the last 168  hours. No results for input(s): AMMONIA in the last 168 hours. CBC: Recent Labs  Lab 12/24/20 0116 12/25/20 0423  WBC 17.4* 12.4*  HGB 11.5* 9.7*  HCT 38.9 33.6*  MCV 95.8 97.4  PLT 271 243   Cardiac Enzymes: No results for input(s): CKTOTAL, CKMB, CKMBINDEX, TROPONINI in the last 168 hours. BNP: BNP (last 3 results) No results for input(s): BNP in the last 8760 hours.  ProBNP (last 3 results) No results for input(s): PROBNP in the last 8760 hours.  CBG: No results for input(s): GLUCAP in the last 168 hours.     Signed:  Rhetta Mura MD   [facility] 12/27/2020, 9:58 AM

## 2020-12-27 NOTE — Progress Notes (Signed)
Discharge teaching complete. Meds, diet, activity, follow up appointments reviewed and copy of instructions given to patient. Prescriptions sent to pharmacy. Patient discharged home via wheelchair with son and brother in law.

## 2020-12-28 DIAGNOSIS — J449 Chronic obstructive pulmonary disease, unspecified: Secondary | ICD-10-CM | POA: Diagnosis not present

## 2021-01-01 DIAGNOSIS — Z743 Need for continuous supervision: Secondary | ICD-10-CM | POA: Diagnosis not present

## 2021-01-01 DIAGNOSIS — R0602 Shortness of breath: Secondary | ICD-10-CM | POA: Diagnosis not present

## 2021-01-01 DIAGNOSIS — E785 Hyperlipidemia, unspecified: Secondary | ICD-10-CM | POA: Diagnosis not present

## 2021-01-01 DIAGNOSIS — J441 Chronic obstructive pulmonary disease with (acute) exacerbation: Secondary | ICD-10-CM | POA: Diagnosis not present

## 2021-01-01 DIAGNOSIS — Z79899 Other long term (current) drug therapy: Secondary | ICD-10-CM | POA: Diagnosis not present

## 2021-01-01 DIAGNOSIS — R519 Headache, unspecified: Secondary | ICD-10-CM | POA: Diagnosis not present

## 2021-01-01 DIAGNOSIS — R6889 Other general symptoms and signs: Secondary | ICD-10-CM | POA: Diagnosis not present

## 2021-01-01 DIAGNOSIS — D649 Anemia, unspecified: Secondary | ICD-10-CM | POA: Diagnosis not present

## 2021-01-01 DIAGNOSIS — E872 Acidosis: Secondary | ICD-10-CM | POA: Diagnosis not present

## 2021-01-01 DIAGNOSIS — Z792 Long term (current) use of antibiotics: Secondary | ICD-10-CM | POA: Diagnosis not present

## 2021-01-01 DIAGNOSIS — Z20822 Contact with and (suspected) exposure to covid-19: Secondary | ICD-10-CM | POA: Diagnosis not present

## 2021-01-01 DIAGNOSIS — A419 Sepsis, unspecified organism: Secondary | ICD-10-CM | POA: Diagnosis not present

## 2021-01-01 DIAGNOSIS — G4489 Other headache syndrome: Secondary | ICD-10-CM | POA: Diagnosis not present

## 2021-01-01 DIAGNOSIS — J440 Chronic obstructive pulmonary disease with acute lower respiratory infection: Secondary | ICD-10-CM | POA: Diagnosis not present

## 2021-01-01 DIAGNOSIS — E042 Nontoxic multinodular goiter: Secondary | ICD-10-CM | POA: Diagnosis not present

## 2021-01-01 DIAGNOSIS — Z8673 Personal history of transient ischemic attack (TIA), and cerebral infarction without residual deficits: Secondary | ICD-10-CM | POA: Diagnosis not present

## 2021-01-01 DIAGNOSIS — K219 Gastro-esophageal reflux disease without esophagitis: Secondary | ICD-10-CM | POA: Diagnosis not present

## 2021-01-01 DIAGNOSIS — G9050 Complex regional pain syndrome I, unspecified: Secondary | ICD-10-CM | POA: Diagnosis not present

## 2021-01-01 DIAGNOSIS — F32A Depression, unspecified: Secondary | ICD-10-CM | POA: Diagnosis not present

## 2021-01-01 DIAGNOSIS — R0902 Hypoxemia: Secondary | ICD-10-CM | POA: Diagnosis not present

## 2021-01-01 DIAGNOSIS — Z89612 Acquired absence of left leg above knee: Secondary | ICD-10-CM | POA: Diagnosis not present

## 2021-01-01 DIAGNOSIS — I5032 Chronic diastolic (congestive) heart failure: Secondary | ICD-10-CM | POA: Diagnosis not present

## 2021-01-01 DIAGNOSIS — N200 Calculus of kidney: Secondary | ICD-10-CM | POA: Diagnosis not present

## 2021-01-01 DIAGNOSIS — Z9981 Dependence on supplemental oxygen: Secondary | ICD-10-CM | POA: Diagnosis not present

## 2021-01-01 DIAGNOSIS — R6520 Severe sepsis without septic shock: Secondary | ICD-10-CM | POA: Diagnosis not present

## 2021-01-01 DIAGNOSIS — G9341 Metabolic encephalopathy: Secondary | ICD-10-CM | POA: Diagnosis not present

## 2021-01-01 DIAGNOSIS — M1990 Unspecified osteoarthritis, unspecified site: Secondary | ICD-10-CM | POA: Diagnosis not present

## 2021-01-01 DIAGNOSIS — D72825 Bandemia: Secondary | ICD-10-CM | POA: Diagnosis not present

## 2021-01-01 DIAGNOSIS — J9602 Acute respiratory failure with hypercapnia: Secondary | ICD-10-CM | POA: Diagnosis not present

## 2021-01-01 DIAGNOSIS — J9611 Chronic respiratory failure with hypoxia: Secondary | ICD-10-CM | POA: Diagnosis not present

## 2021-01-01 DIAGNOSIS — R0600 Dyspnea, unspecified: Secondary | ICD-10-CM | POA: Diagnosis not present

## 2021-01-08 DIAGNOSIS — E042 Nontoxic multinodular goiter: Secondary | ICD-10-CM | POA: Diagnosis not present

## 2021-01-14 DIAGNOSIS — S78112S Complete traumatic amputation at level between left hip and knee, sequela: Secondary | ICD-10-CM | POA: Diagnosis not present

## 2021-01-14 DIAGNOSIS — G9340 Encephalopathy, unspecified: Secondary | ICD-10-CM | POA: Diagnosis not present

## 2021-01-14 DIAGNOSIS — N200 Calculus of kidney: Secondary | ICD-10-CM | POA: Diagnosis not present

## 2021-01-14 DIAGNOSIS — S88912S Complete traumatic amputation of left lower leg, level unspecified, sequela: Secondary | ICD-10-CM | POA: Diagnosis not present

## 2021-01-14 DIAGNOSIS — J449 Chronic obstructive pulmonary disease, unspecified: Secondary | ICD-10-CM | POA: Diagnosis not present

## 2021-01-14 DIAGNOSIS — J9611 Chronic respiratory failure with hypoxia: Secondary | ICD-10-CM | POA: Diagnosis not present

## 2021-01-14 DIAGNOSIS — G8194 Hemiplegia, unspecified affecting left nondominant side: Secondary | ICD-10-CM | POA: Diagnosis not present

## 2021-01-22 DIAGNOSIS — E785 Hyperlipidemia, unspecified: Secondary | ICD-10-CM | POA: Diagnosis not present

## 2021-01-22 DIAGNOSIS — H1010 Acute atopic conjunctivitis, unspecified eye: Secondary | ICD-10-CM | POA: Diagnosis not present

## 2021-01-22 DIAGNOSIS — R739 Hyperglycemia, unspecified: Secondary | ICD-10-CM | POA: Diagnosis not present

## 2021-01-22 DIAGNOSIS — Z79899 Other long term (current) drug therapy: Secondary | ICD-10-CM | POA: Diagnosis not present

## 2021-01-22 DIAGNOSIS — M792 Neuralgia and neuritis, unspecified: Secondary | ICD-10-CM | POA: Diagnosis not present

## 2021-01-22 DIAGNOSIS — G4700 Insomnia, unspecified: Secondary | ICD-10-CM | POA: Diagnosis not present

## 2021-01-22 DIAGNOSIS — E041 Nontoxic single thyroid nodule: Secondary | ICD-10-CM | POA: Diagnosis not present

## 2021-01-28 DIAGNOSIS — J449 Chronic obstructive pulmonary disease, unspecified: Secondary | ICD-10-CM | POA: Diagnosis not present

## 2021-02-25 DIAGNOSIS — E785 Hyperlipidemia, unspecified: Secondary | ICD-10-CM | POA: Diagnosis not present

## 2021-02-25 DIAGNOSIS — R739 Hyperglycemia, unspecified: Secondary | ICD-10-CM | POA: Diagnosis not present

## 2021-02-25 DIAGNOSIS — Z1159 Encounter for screening for other viral diseases: Secondary | ICD-10-CM | POA: Diagnosis not present

## 2021-02-27 DIAGNOSIS — E785 Hyperlipidemia, unspecified: Secondary | ICD-10-CM | POA: Diagnosis not present

## 2021-02-27 DIAGNOSIS — J449 Chronic obstructive pulmonary disease, unspecified: Secondary | ICD-10-CM | POA: Diagnosis not present

## 2021-02-27 DIAGNOSIS — I10 Essential (primary) hypertension: Secondary | ICD-10-CM | POA: Diagnosis not present

## 2021-03-12 DIAGNOSIS — J9611 Chronic respiratory failure with hypoxia: Secondary | ICD-10-CM | POA: Diagnosis not present

## 2021-03-12 DIAGNOSIS — Z03818 Encounter for observation for suspected exposure to other biological agents ruled out: Secondary | ICD-10-CM | POA: Diagnosis not present

## 2021-03-12 DIAGNOSIS — G4733 Obstructive sleep apnea (adult) (pediatric): Secondary | ICD-10-CM | POA: Diagnosis not present

## 2021-03-12 DIAGNOSIS — J301 Allergic rhinitis due to pollen: Secondary | ICD-10-CM | POA: Diagnosis not present

## 2021-03-12 DIAGNOSIS — J441 Chronic obstructive pulmonary disease with (acute) exacerbation: Secondary | ICD-10-CM | POA: Diagnosis not present

## 2021-03-12 DIAGNOSIS — R5383 Other fatigue: Secondary | ICD-10-CM | POA: Diagnosis not present

## 2021-03-26 DIAGNOSIS — Z03818 Encounter for observation for suspected exposure to other biological agents ruled out: Secondary | ICD-10-CM | POA: Diagnosis not present

## 2021-03-26 DIAGNOSIS — R5383 Other fatigue: Secondary | ICD-10-CM | POA: Diagnosis not present

## 2021-03-26 DIAGNOSIS — J449 Chronic obstructive pulmonary disease, unspecified: Secondary | ICD-10-CM | POA: Diagnosis not present

## 2021-03-26 DIAGNOSIS — G4733 Obstructive sleep apnea (adult) (pediatric): Secondary | ICD-10-CM | POA: Diagnosis not present

## 2021-03-26 DIAGNOSIS — J301 Allergic rhinitis due to pollen: Secondary | ICD-10-CM | POA: Diagnosis not present

## 2021-03-26 DIAGNOSIS — J9611 Chronic respiratory failure with hypoxia: Secondary | ICD-10-CM | POA: Diagnosis not present

## 2021-03-30 DIAGNOSIS — J449 Chronic obstructive pulmonary disease, unspecified: Secondary | ICD-10-CM | POA: Diagnosis not present

## 2021-04-02 DIAGNOSIS — G629 Polyneuropathy, unspecified: Secondary | ICD-10-CM | POA: Diagnosis not present

## 2021-04-02 DIAGNOSIS — R42 Dizziness and giddiness: Secondary | ICD-10-CM | POA: Diagnosis not present

## 2021-04-02 DIAGNOSIS — Z79899 Other long term (current) drug therapy: Secondary | ICD-10-CM | POA: Diagnosis not present

## 2021-04-02 DIAGNOSIS — I10 Essential (primary) hypertension: Secondary | ICD-10-CM | POA: Diagnosis not present

## 2021-04-30 DIAGNOSIS — J449 Chronic obstructive pulmonary disease, unspecified: Secondary | ICD-10-CM | POA: Diagnosis not present

## 2021-05-06 DIAGNOSIS — R739 Hyperglycemia, unspecified: Secondary | ICD-10-CM | POA: Diagnosis not present

## 2021-05-06 DIAGNOSIS — J9611 Chronic respiratory failure with hypoxia: Secondary | ICD-10-CM | POA: Diagnosis not present

## 2021-05-06 DIAGNOSIS — G629 Polyneuropathy, unspecified: Secondary | ICD-10-CM | POA: Diagnosis not present

## 2021-05-06 DIAGNOSIS — Z79899 Other long term (current) drug therapy: Secondary | ICD-10-CM | POA: Diagnosis not present

## 2021-05-06 DIAGNOSIS — Z1159 Encounter for screening for other viral diseases: Secondary | ICD-10-CM | POA: Diagnosis not present

## 2021-05-06 DIAGNOSIS — J449 Chronic obstructive pulmonary disease, unspecified: Secondary | ICD-10-CM | POA: Diagnosis not present

## 2021-05-06 DIAGNOSIS — E785 Hyperlipidemia, unspecified: Secondary | ICD-10-CM | POA: Diagnosis not present

## 2021-05-08 DIAGNOSIS — J9601 Acute respiratory failure with hypoxia: Secondary | ICD-10-CM | POA: Diagnosis not present

## 2021-05-08 DIAGNOSIS — Z8673 Personal history of transient ischemic attack (TIA), and cerebral infarction without residual deficits: Secondary | ICD-10-CM | POA: Diagnosis not present

## 2021-05-08 DIAGNOSIS — R069 Unspecified abnormalities of breathing: Secondary | ICD-10-CM | POA: Diagnosis not present

## 2021-05-08 DIAGNOSIS — Z743 Need for continuous supervision: Secondary | ICD-10-CM | POA: Diagnosis not present

## 2021-05-08 DIAGNOSIS — Z7401 Bed confinement status: Secondary | ICD-10-CM | POA: Diagnosis not present

## 2021-05-08 DIAGNOSIS — R0689 Other abnormalities of breathing: Secondary | ICD-10-CM | POA: Diagnosis not present

## 2021-05-08 DIAGNOSIS — J9622 Acute and chronic respiratory failure with hypercapnia: Secondary | ICD-10-CM | POA: Diagnosis not present

## 2021-05-08 DIAGNOSIS — I509 Heart failure, unspecified: Secondary | ICD-10-CM | POA: Diagnosis not present

## 2021-05-08 DIAGNOSIS — Z79899 Other long term (current) drug therapy: Secondary | ICD-10-CM | POA: Diagnosis not present

## 2021-05-08 DIAGNOSIS — J9602 Acute respiratory failure with hypercapnia: Secondary | ICD-10-CM | POA: Diagnosis not present

## 2021-05-08 DIAGNOSIS — Z9181 History of falling: Secondary | ICD-10-CM | POA: Diagnosis not present

## 2021-05-08 DIAGNOSIS — J9621 Acute and chronic respiratory failure with hypoxia: Secondary | ICD-10-CM | POA: Diagnosis not present

## 2021-05-08 DIAGNOSIS — Z9981 Dependence on supplemental oxygen: Secondary | ICD-10-CM | POA: Diagnosis not present

## 2021-05-08 DIAGNOSIS — R059 Cough, unspecified: Secondary | ICD-10-CM | POA: Diagnosis not present

## 2021-05-08 DIAGNOSIS — R0602 Shortness of breath: Secondary | ICD-10-CM | POA: Diagnosis not present

## 2021-05-08 DIAGNOSIS — G4733 Obstructive sleep apnea (adult) (pediatric): Secondary | ICD-10-CM | POA: Diagnosis not present

## 2021-05-08 DIAGNOSIS — Z9989 Dependence on other enabling machines and devices: Secondary | ICD-10-CM | POA: Diagnosis not present

## 2021-05-08 DIAGNOSIS — J441 Chronic obstructive pulmonary disease with (acute) exacerbation: Secondary | ICD-10-CM | POA: Diagnosis not present

## 2021-05-28 DIAGNOSIS — J449 Chronic obstructive pulmonary disease, unspecified: Secondary | ICD-10-CM | POA: Diagnosis not present

## 2021-05-28 DIAGNOSIS — J9611 Chronic respiratory failure with hypoxia: Secondary | ICD-10-CM | POA: Diagnosis not present

## 2021-05-28 DIAGNOSIS — Z79899 Other long term (current) drug therapy: Secondary | ICD-10-CM | POA: Diagnosis not present

## 2021-05-28 DIAGNOSIS — G4730 Sleep apnea, unspecified: Secondary | ICD-10-CM | POA: Diagnosis not present

## 2021-05-29 DIAGNOSIS — I10 Essential (primary) hypertension: Secondary | ICD-10-CM | POA: Diagnosis not present

## 2021-05-29 DIAGNOSIS — J449 Chronic obstructive pulmonary disease, unspecified: Secondary | ICD-10-CM | POA: Diagnosis not present

## 2021-05-30 DIAGNOSIS — J449 Chronic obstructive pulmonary disease, unspecified: Secondary | ICD-10-CM | POA: Diagnosis not present

## 2021-06-04 DIAGNOSIS — J069 Acute upper respiratory infection, unspecified: Secondary | ICD-10-CM | POA: Diagnosis not present

## 2021-06-04 DIAGNOSIS — J449 Chronic obstructive pulmonary disease, unspecified: Secondary | ICD-10-CM | POA: Diagnosis not present

## 2021-06-04 DIAGNOSIS — J9611 Chronic respiratory failure with hypoxia: Secondary | ICD-10-CM | POA: Diagnosis not present

## 2021-06-04 DIAGNOSIS — G4733 Obstructive sleep apnea (adult) (pediatric): Secondary | ICD-10-CM | POA: Diagnosis not present

## 2021-06-04 DIAGNOSIS — J301 Allergic rhinitis due to pollen: Secondary | ICD-10-CM | POA: Diagnosis not present

## 2021-06-04 DIAGNOSIS — R5383 Other fatigue: Secondary | ICD-10-CM | POA: Diagnosis not present

## 2021-06-22 DIAGNOSIS — I10 Essential (primary) hypertension: Secondary | ICD-10-CM | POA: Diagnosis not present

## 2021-06-22 DIAGNOSIS — H1010 Acute atopic conjunctivitis, unspecified eye: Secondary | ICD-10-CM | POA: Diagnosis not present

## 2021-06-22 DIAGNOSIS — J9611 Chronic respiratory failure with hypoxia: Secondary | ICD-10-CM | POA: Diagnosis not present

## 2021-06-22 DIAGNOSIS — Z23 Encounter for immunization: Secondary | ICD-10-CM | POA: Diagnosis not present

## 2021-06-22 DIAGNOSIS — J449 Chronic obstructive pulmonary disease, unspecified: Secondary | ICD-10-CM | POA: Diagnosis not present

## 2021-06-22 DIAGNOSIS — Z79899 Other long term (current) drug therapy: Secondary | ICD-10-CM | POA: Diagnosis not present

## 2021-06-26 DIAGNOSIS — I10 Essential (primary) hypertension: Secondary | ICD-10-CM | POA: Diagnosis not present

## 2021-06-26 DIAGNOSIS — J449 Chronic obstructive pulmonary disease, unspecified: Secondary | ICD-10-CM | POA: Diagnosis not present

## 2021-06-30 DIAGNOSIS — J449 Chronic obstructive pulmonary disease, unspecified: Secondary | ICD-10-CM | POA: Diagnosis not present

## 2021-07-02 DIAGNOSIS — J9611 Chronic respiratory failure with hypoxia: Secondary | ICD-10-CM | POA: Diagnosis not present

## 2021-07-02 DIAGNOSIS — J301 Allergic rhinitis due to pollen: Secondary | ICD-10-CM | POA: Diagnosis not present

## 2021-07-02 DIAGNOSIS — G4733 Obstructive sleep apnea (adult) (pediatric): Secondary | ICD-10-CM | POA: Diagnosis not present

## 2021-07-02 DIAGNOSIS — R5383 Other fatigue: Secondary | ICD-10-CM | POA: Diagnosis not present

## 2021-07-02 DIAGNOSIS — J449 Chronic obstructive pulmonary disease, unspecified: Secondary | ICD-10-CM | POA: Diagnosis not present

## 2021-07-13 ENCOUNTER — Ambulatory Visit (INDEPENDENT_AMBULATORY_CARE_PROVIDER_SITE_OTHER): Payer: Medicare Other | Admitting: Orthopedic Surgery

## 2021-07-13 ENCOUNTER — Encounter: Payer: Self-pay | Admitting: Orthopedic Surgery

## 2021-07-13 DIAGNOSIS — Z89612 Acquired absence of left leg above knee: Secondary | ICD-10-CM

## 2021-07-13 DIAGNOSIS — Z794 Long term (current) use of insulin: Secondary | ICD-10-CM

## 2021-07-13 DIAGNOSIS — E1140 Type 2 diabetes mellitus with diabetic neuropathy, unspecified: Secondary | ICD-10-CM | POA: Diagnosis not present

## 2021-07-13 MED ORDER — GABAPENTIN 300 MG PO CAPS: 300.0000 mg | ORAL_CAPSULE | Freq: Three times a day (TID) | ORAL | 3 refills | Status: DC

## 2021-07-13 NOTE — Progress Notes (Signed)
Office Visit Note   Patient: Amanda Lewis           Date of Birth: [birth year]           MRN: [id number] Visit Date: 07/13/2021              Requested by: Galvin Proffer, MD [address] [place],  [place] [address] PCP: Galvin Proffer, MD  Chief Complaint  Patient presents with   Left Leg - Follow-up    Hx Left AKA      HPI: Patient is a 65 year old woman who is 6 years status post left above-the-knee amputation.  Patient states she was able to ambulate with her prosthesis until she had mechanical symptoms in her right knee.  She states she underwent arthroscopic debridement of the right knee now cannot fully extend her knee and cannot wear her prosthesis due to the lack of function in her left knee.  Patient complains of new neuropathic pain over the dorsal medial aspect of the femur.  Assessment & Plan: Visit Diagnoses:  1. 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 Type 2 diabetes mellitus with diabetic neuropathy, with long-term current use of insulin (HCC)     Plan: Will call in a prescription for Neurontin 300 mg 3 times a day she will increase the dose as needed.  Patient states she is currently taking Neurontin 100 mg 2 times a day.  Follow-Up Instructions: Return in about 4 weeks (around 08/10/2021).   Ortho Exam  Patient is alert, oriented, no adenopathy, well-dressed, normal affect, normal respiratory effort. Examination there is no redness no cellulitis no pain over the sciatic nerve.  She is point tender to palpation over the medial aspect of the femur.  There is no cellulitis no warmth no skin changes no signs of infection no signs of DVT there is no swelling.  Imaging: No results found. No images are attached to the encounter.  Labs: Lab Results  Component Value Date   REPTSTATUS 12/26/2020 FINAL 12/24/2020   CULT MULTIPLE SPECIES PRESENT, SUGGEST RECOLLECTION (A) 12/24/2020     Lab Results  Component Value Date    ALBUMIN 3.1 (L) 12/27/2020   ALBUMIN 3.1 (L) 12/26/2020   ALBUMIN 3.3 (L) 07/19/2015    No results found for: MG No results found for: VD25OH  No results found for: PREALBUMIN CBC EXTENDED Latest Ref Rng & Units 12/25/2020 12/24/2020 07/19/2015  WBC 4.0 - 10.5 K/uL 12.4(H) 17.4(H) 9.9  RBC 3.87 - 5.11 MIL/uL 3.45(L) 4.06 4.28  HGB 12.0 - 15.0 g/dL 3.6(U) 11.5(L) 12.5  HCT 36.0 - 46.0 % 33.6(L) 38.9 40.4  PLT 150 - 400 K/uL 243 271 243     There is no height or weight on file to calculate BMI.  Orders:  No orders of the defined types were placed in this encounter.  No orders of the defined types were placed in this encounter.    Procedures: No procedures performed  Clinical Data: No additional findings.  ROS:  All other systems negative, except as noted in the HPI. Review of Systems  Objective: Vital Signs: There were no vitals taken for this visit.  Specialty Comments:  No specialty comments available.  PMFS History: Patient Active Problem List   Diagnosis Date Noted   COPD with acute exacerbation (HCC) 12/24/2020   Multiple thyroid nodules    Chronic lower back pain    COPD (chronic obstructive pulmonary disease) (HCC)    DNR (  do not resuscitate)    S/P AKA (above knee amputation) (HCC) 07/19/2015   Complex regional pain syndrome of left lower extremity 01/13/2012    Class: Diagnosis of   Sleep apnea 2010   Past Medical History:  Diagnosis Date   Anemia    Anxiety    on treatment   Arthritis    arms   Asthma    exertional   Chronic lower back pain    COPD (chronic obstructive pulmonary disease) (HCC)    usesO2 24/7   Depression    DNR (do not resuscitate)    Dysuria-frequency syndrome    GERD (gastroesophageal reflux disease)    Headache(784.0)    intermittent migraines   History of kidney stones    Multiple thyroid nodules    Pneumonia    Recurrent upper respiratory infection (URI)    Rotator cuff tear, right    RSD lower limb    left  lower leg   Sleep apnea 2010   uses continous oxygen   Stroke (HCC) 1997    Family History  Problem Relation Age of Onset   Anesthesia problems Neg Hx     Past Surgical History:  Procedure Laterality Date   ABDOMINAL HYSTERECTOMY  2003   AMPUTATION  01/12/2012   Procedure: AMPUTATION ABOVE KNEE;  Surgeon: Nadara Mustard, MD;  Location: [facility] OR;  Service: Orthopedics;  Laterality: Left;  Left Above Knee Amputation   APPENDECTOMY     FRACTURE SURGERY     Morphine Pump     Morphine Pump Removed  2006   ORIF ANKLE FRACTURE  1995   STUMP REVISION Left 07/19/2015   Procedure: Revision Left Above Knee Amputation;  Surgeon: Nadara Mustard, MD;  Location: [facility] OR;  Service: Orthopedics;  Laterality: Left;   Social History   Occupational History   Occupation: disabled  Tobacco Use   Smoking status: Former    Packs/day: 2.00    Years: 35.00    Pack years: 70.00    Types: Cigarettes    Quit date: 09/27/2008    Years since quitting: 12.8   Smokeless tobacco: Never  Substance and Sexual Activity   Alcohol use: No   Drug use: No   Sexual activity: Not Currently    Birth control/protection: Surgical

## 2021-07-16 ENCOUNTER — Telehealth: Payer: Self-pay | Admitting: Orthopedic Surgery

## 2021-07-16 NOTE — Telephone Encounter (Signed)
Pt confirmed she is taking gabapentin 400mg  TID. She thought she was on 100mg . She is also taking tramadol 50mg  qd.

## 2021-07-16 NOTE — Telephone Encounter (Signed)
Pt called to inform Dr. Lajoyce Corners that she already takes 400 mg of gabapentin. Please call pt about this matter at [phone].

## 2021-07-17 NOTE — Telephone Encounter (Signed)
I called the number provided below, whomever answered said that I had the wrong number. I called her alternate number and it is a busy tone.

## 2021-07-17 NOTE — Telephone Encounter (Signed)
She takes 400mg  three times a day, not twice a day.

## 2021-07-20 NOTE — Telephone Encounter (Signed)
I called pt again. It did ring, but no answer. Will try again.

## 2021-07-21 NOTE — Telephone Encounter (Signed)
Cannot get a hold of pt.

## 2021-07-29 DIAGNOSIS — E119 Type 2 diabetes mellitus without complications: Secondary | ICD-10-CM | POA: Diagnosis not present

## 2021-07-29 DIAGNOSIS — I10 Essential (primary) hypertension: Secondary | ICD-10-CM | POA: Diagnosis not present

## 2021-07-29 DIAGNOSIS — E785 Hyperlipidemia, unspecified: Secondary | ICD-10-CM | POA: Diagnosis not present

## 2021-07-30 DIAGNOSIS — J449 Chronic obstructive pulmonary disease, unspecified: Secondary | ICD-10-CM | POA: Diagnosis not present

## 2021-08-10 ENCOUNTER — Ambulatory Visit: Payer: Medicaid Other | Admitting: Orthopedic Surgery

## 2021-08-17 DIAGNOSIS — G4733 Obstructive sleep apnea (adult) (pediatric): Secondary | ICD-10-CM | POA: Diagnosis not present

## 2021-08-17 DIAGNOSIS — Z03818 Encounter for observation for suspected exposure to other biological agents ruled out: Secondary | ICD-10-CM | POA: Diagnosis not present

## 2021-08-17 DIAGNOSIS — R5383 Other fatigue: Secondary | ICD-10-CM | POA: Diagnosis not present

## 2021-08-17 DIAGNOSIS — R062 Wheezing: Secondary | ICD-10-CM | POA: Diagnosis not present

## 2021-08-17 DIAGNOSIS — J301 Allergic rhinitis due to pollen: Secondary | ICD-10-CM | POA: Diagnosis not present

## 2021-08-17 DIAGNOSIS — J9611 Chronic respiratory failure with hypoxia: Secondary | ICD-10-CM | POA: Diagnosis not present

## 2021-08-17 DIAGNOSIS — J449 Chronic obstructive pulmonary disease, unspecified: Secondary | ICD-10-CM | POA: Diagnosis not present

## 2021-08-25 DIAGNOSIS — Z9181 History of falling: Secondary | ICD-10-CM | POA: Diagnosis not present

## 2021-08-25 DIAGNOSIS — E785 Hyperlipidemia, unspecified: Secondary | ICD-10-CM | POA: Diagnosis not present

## 2021-08-25 DIAGNOSIS — Z0000 Encounter for general adult medical examination without abnormal findings: Secondary | ICD-10-CM | POA: Diagnosis not present

## 2021-08-28 DIAGNOSIS — I10 Essential (primary) hypertension: Secondary | ICD-10-CM | POA: Diagnosis not present

## 2021-08-28 DIAGNOSIS — J449 Chronic obstructive pulmonary disease, unspecified: Secondary | ICD-10-CM | POA: Diagnosis not present

## 2021-08-30 DIAGNOSIS — J449 Chronic obstructive pulmonary disease, unspecified: Secondary | ICD-10-CM | POA: Diagnosis not present

## 2021-09-02 DIAGNOSIS — J449 Chronic obstructive pulmonary disease, unspecified: Secondary | ICD-10-CM | POA: Diagnosis not present

## 2021-09-04 DIAGNOSIS — Z79899 Other long term (current) drug therapy: Secondary | ICD-10-CM | POA: Diagnosis not present

## 2021-09-04 DIAGNOSIS — G629 Polyneuropathy, unspecified: Secondary | ICD-10-CM | POA: Diagnosis not present

## 2021-09-04 DIAGNOSIS — J449 Chronic obstructive pulmonary disease, unspecified: Secondary | ICD-10-CM | POA: Diagnosis not present

## 2021-09-04 DIAGNOSIS — J9611 Chronic respiratory failure with hypoxia: Secondary | ICD-10-CM | POA: Diagnosis not present

## 2021-09-04 DIAGNOSIS — E785 Hyperlipidemia, unspecified: Secondary | ICD-10-CM | POA: Diagnosis not present

## 2021-09-04 DIAGNOSIS — R739 Hyperglycemia, unspecified: Secondary | ICD-10-CM | POA: Diagnosis not present

## 2021-09-10 DIAGNOSIS — G4733 Obstructive sleep apnea (adult) (pediatric): Secondary | ICD-10-CM | POA: Diagnosis not present

## 2021-09-10 DIAGNOSIS — R5383 Other fatigue: Secondary | ICD-10-CM | POA: Diagnosis not present

## 2021-09-10 DIAGNOSIS — J449 Chronic obstructive pulmonary disease, unspecified: Secondary | ICD-10-CM | POA: Diagnosis not present

## 2021-09-10 DIAGNOSIS — J9611 Chronic respiratory failure with hypoxia: Secondary | ICD-10-CM | POA: Diagnosis not present

## 2021-09-10 DIAGNOSIS — J301 Allergic rhinitis due to pollen: Secondary | ICD-10-CM | POA: Diagnosis not present

## 2021-09-17 ENCOUNTER — Ambulatory Visit (INDEPENDENT_AMBULATORY_CARE_PROVIDER_SITE_OTHER): Payer: Medicare Other | Admitting: Orthopedic Surgery

## 2021-09-17 ENCOUNTER — Telehealth: Payer: Self-pay | Admitting: Orthopedic Surgery

## 2021-09-17 DIAGNOSIS — Z89612 Acquired absence of left leg above knee: Secondary | ICD-10-CM | POA: Diagnosis not present

## 2021-09-17 NOTE — Telephone Encounter (Signed)
Pt asked to have a letter sent to her explaining why our office doesn't accept out of state WC.

## 2021-09-29 DIAGNOSIS — J449 Chronic obstructive pulmonary disease, unspecified: Secondary | ICD-10-CM | POA: Diagnosis not present

## 2021-09-29 DIAGNOSIS — I10 Essential (primary) hypertension: Secondary | ICD-10-CM | POA: Diagnosis not present

## 2021-09-30 DIAGNOSIS — J449 Chronic obstructive pulmonary disease, unspecified: Secondary | ICD-10-CM | POA: Diagnosis not present

## 2021-10-04 ENCOUNTER — Encounter: Payer: Self-pay | Admitting: Orthopedic Surgery

## 2021-10-04 NOTE — Progress Notes (Signed)
Office Visit Note   Patient: Amanda Lewis           Date of Birth: Jun 22, 1956           MRN: [id number] Visit Date: 09/17/2021              Requested by: Bonnita Nasuti, MD [address] [place],  [place] [address] PCP: Bonnita Nasuti, MD  Chief Complaint  Patient presents with   Left Leg - Follow-up      HPI: Patient is a 67 year old woman who presents complaining of neuropathic pain in the left leg.  She is status post revision left above-knee amputation and 2016.  She is currently taking Neurontin 400 mg twice a day.  She is on nasal cannula FiO2 at rest.  Assessment & Plan: Visit Diagnoses:  1. Status post above-knee amputation of left lower extremity ([place])     Plan: Discussed that she could try increasing the Neurontin to 3 times a day to see if this helps if there is no benefit then go back to twice a day.  Recommended omega-3 protein and vitamin D3.  Follow-Up Instructions: Return if symptoms worsen or fail to improve.   Ortho Exam  Patient is alert, oriented, no adenopathy, well-dressed, normal affect, normal respiratory effort. Examination there is no redness no tenderness to palpation there is no ulcers in the left above-knee amputation.  Imaging: No results found. No images are attached to the encounter.  Labs: Lab Results  Component Value Date   REPTSTATUS 12/26/2020 FINAL 12/24/2020   CULT MULTIPLE SPECIES PRESENT, SUGGEST RECOLLECTION (A) 12/24/2020     Lab Results  Component Value Date   ALBUMIN 3.1 (L) 12/27/2020   ALBUMIN 3.1 (L) 12/26/2020   ALBUMIN 3.3 (L) 07/19/2015    No results found for: MG No results found for: VD25OH  No results found for: PREALBUMIN CBC EXTENDED Latest Ref Rng & Units 12/25/2020 12/24/2020 07/19/2015  WBC 4.0 - 10.5 K/uL 12.4(H) 17.4(H) 9.9  RBC 3.87 - 5.11 MIL/uL 3.45(L) 4.06 4.28  HGB 12.0 - 15.0 g/dL 9.7(L) 11.5(L) 12.5  HCT 36.0 - 46.0 % 33.6(L) 38.9 40.4  PLT 150 - 400 K/uL 243 271 243     There is  no height or weight on file to calculate BMI.  Orders:  No orders of the defined types were placed in this encounter.  No orders of the defined types were placed in this encounter.    Procedures: No procedures performed  Clinical Data: No additional findings.  ROS:  All other systems negative, except as noted in the HPI. Review of Systems  Objective: Vital Signs: There were no vitals taken for this visit.  Specialty Comments:  No specialty comments available.  PMFS History: Patient Active Problem List   Diagnosis Date Noted   COPD with acute exacerbation ([place]) 12/24/2020   Multiple thyroid nodules    Chronic lower back pain    COPD (chronic obstructive pulmonary disease) (HCC)    DNR (do not resuscitate)    S/P AKA (above knee amputation) ([place]) 07/19/2015   Complex regional pain syndrome of left lower extremity 01/13/2012    Class: Diagnosis of   Sleep apnea 2010   Past Medical History:  Diagnosis Date   Anemia    Anxiety    on treatment   Arthritis    arms   Asthma    exertional   Chronic lower back pain    COPD (chronic obstructive pulmonary disease) ([place])  usesO2 24/7   Depression    DNR (do not resuscitate)    Dysuria-frequency syndrome    GERD (gastroesophageal reflux disease)    Headache(784.0)    intermittent migraines   History of kidney stones    Multiple thyroid nodules    Pneumonia    Recurrent upper respiratory infection (URI)    Rotator cuff tear, right    RSD lower limb    left lower leg   Sleep apnea 2010   uses continous oxygen   Stroke ([facility]) 1997    Family History  Problem Relation Age of Onset   Anesthesia problems Neg Hx     Past Surgical History:  Procedure Laterality Date   ABDOMINAL HYSTERECTOMY  2003   AMPUTATION  01/12/2012   Procedure: AMPUTATION ABOVE KNEE;  Surgeon: Newt Minion, MD;  Location: [facility];  Service: Orthopedics;  Laterality: Left;  Left Above Knee Amputation   APPENDECTOMY     FRACTURE SURGERY      Morphine Pump     Morphine Pump Removed  2006   ORIF ANKLE FRACTURE  1995   STUMP REVISION Left 07/19/2015   Procedure: Revision Left Above Knee Amputation;  Surgeon: Newt Minion, MD;  Location: [facility];  Service: Orthopedics;  Laterality: Left;   Social History   Occupational History   Occupation: disabled  Tobacco Use   Smoking status: Former    Packs/day: 2.00    Years: 35.00    Pack years: 70.00    Types: Cigarettes    Quit date: 09/27/2008    Years since quitting: 13.0   Smokeless tobacco: Never  Substance and Sexual Activity   Alcohol use: No   Drug use: No   Sexual activity: Not Currently    Birth control/protection: Surgical

## 2021-10-05 DIAGNOSIS — R739 Hyperglycemia, unspecified: Secondary | ICD-10-CM | POA: Diagnosis not present

## 2021-10-05 DIAGNOSIS — S88912S Complete traumatic amputation of left lower leg, level unspecified, sequela: Secondary | ICD-10-CM | POA: Diagnosis not present

## 2021-10-05 DIAGNOSIS — J449 Chronic obstructive pulmonary disease, unspecified: Secondary | ICD-10-CM | POA: Diagnosis not present

## 2021-10-05 DIAGNOSIS — S78112S Complete traumatic amputation at level between left hip and knee, sequela: Secondary | ICD-10-CM | POA: Diagnosis not present

## 2021-10-15 DIAGNOSIS — R531 Weakness: Secondary | ICD-10-CM | POA: Diagnosis not present

## 2021-10-15 DIAGNOSIS — S93601A Unspecified sprain of right foot, initial encounter: Secondary | ICD-10-CM | POA: Diagnosis not present

## 2021-10-15 DIAGNOSIS — Z8673 Personal history of transient ischemic attack (TIA), and cerebral infarction without residual deficits: Secondary | ICD-10-CM | POA: Diagnosis not present

## 2021-10-15 DIAGNOSIS — M25571 Pain in right ankle and joints of right foot: Secondary | ICD-10-CM | POA: Diagnosis not present

## 2021-10-15 DIAGNOSIS — Z743 Need for continuous supervision: Secondary | ICD-10-CM | POA: Diagnosis not present

## 2021-10-15 DIAGNOSIS — M25561 Pain in right knee: Secondary | ICD-10-CM | POA: Diagnosis not present

## 2021-10-15 DIAGNOSIS — R609 Edema, unspecified: Secondary | ICD-10-CM | POA: Diagnosis not present

## 2021-10-15 DIAGNOSIS — M2391 Unspecified internal derangement of right knee: Secondary | ICD-10-CM | POA: Diagnosis not present

## 2021-10-15 DIAGNOSIS — R0902 Hypoxemia: Secondary | ICD-10-CM | POA: Diagnosis not present

## 2021-10-15 DIAGNOSIS — M79673 Pain in unspecified foot: Secondary | ICD-10-CM | POA: Diagnosis not present

## 2021-10-15 DIAGNOSIS — S46912A Strain of unspecified muscle, fascia and tendon at shoulder and upper arm level, left arm, initial encounter: Secondary | ICD-10-CM | POA: Diagnosis not present

## 2021-10-15 DIAGNOSIS — Z043 Encounter for examination and observation following other accident: Secondary | ICD-10-CM | POA: Diagnosis not present

## 2021-10-15 DIAGNOSIS — S93691A Other sprain of right foot, initial encounter: Secondary | ICD-10-CM | POA: Diagnosis not present

## 2021-10-15 DIAGNOSIS — S43402A Unspecified sprain of left shoulder joint, initial encounter: Secondary | ICD-10-CM | POA: Diagnosis not present

## 2021-10-15 DIAGNOSIS — W19XXXA Unspecified fall, initial encounter: Secondary | ICD-10-CM | POA: Diagnosis not present

## 2021-10-28 DIAGNOSIS — J449 Chronic obstructive pulmonary disease, unspecified: Secondary | ICD-10-CM | POA: Diagnosis not present

## 2021-11-02 DIAGNOSIS — Z79899 Other long term (current) drug therapy: Secondary | ICD-10-CM | POA: Diagnosis not present

## 2021-11-02 DIAGNOSIS — M179 Osteoarthritis of knee, unspecified: Secondary | ICD-10-CM | POA: Diagnosis not present

## 2021-11-02 DIAGNOSIS — M25512 Pain in left shoulder: Secondary | ICD-10-CM | POA: Diagnosis not present

## 2021-11-26 DIAGNOSIS — J301 Allergic rhinitis due to pollen: Secondary | ICD-10-CM | POA: Diagnosis not present

## 2021-11-26 DIAGNOSIS — J9611 Chronic respiratory failure with hypoxia: Secondary | ICD-10-CM | POA: Diagnosis not present

## 2021-11-26 DIAGNOSIS — R911 Solitary pulmonary nodule: Secondary | ICD-10-CM | POA: Diagnosis not present

## 2021-11-26 DIAGNOSIS — J449 Chronic obstructive pulmonary disease, unspecified: Secondary | ICD-10-CM | POA: Diagnosis not present

## 2021-11-26 DIAGNOSIS — G4733 Obstructive sleep apnea (adult) (pediatric): Secondary | ICD-10-CM | POA: Diagnosis not present

## 2021-11-26 DIAGNOSIS — R5383 Other fatigue: Secondary | ICD-10-CM | POA: Diagnosis not present

## 2021-11-27 DIAGNOSIS — M810 Age-related osteoporosis without current pathological fracture: Secondary | ICD-10-CM | POA: Diagnosis not present

## 2021-11-27 DIAGNOSIS — Z1231 Encounter for screening mammogram for malignant neoplasm of breast: Secondary | ICD-10-CM | POA: Diagnosis not present

## 2021-11-27 DIAGNOSIS — M8580 Other specified disorders of bone density and structure, unspecified site: Secondary | ICD-10-CM | POA: Diagnosis not present

## 2021-11-28 DIAGNOSIS — J449 Chronic obstructive pulmonary disease, unspecified: Secondary | ICD-10-CM | POA: Diagnosis not present

## 2021-12-02 DIAGNOSIS — E785 Hyperlipidemia, unspecified: Secondary | ICD-10-CM | POA: Diagnosis not present

## 2021-12-02 DIAGNOSIS — J449 Chronic obstructive pulmonary disease, unspecified: Secondary | ICD-10-CM | POA: Diagnosis not present

## 2021-12-02 DIAGNOSIS — Z79899 Other long term (current) drug therapy: Secondary | ICD-10-CM | POA: Diagnosis not present

## 2021-12-02 DIAGNOSIS — K5909 Other constipation: Secondary | ICD-10-CM | POA: Diagnosis not present

## 2021-12-02 DIAGNOSIS — M25512 Pain in left shoulder: Secondary | ICD-10-CM | POA: Diagnosis not present

## 2021-12-02 DIAGNOSIS — R350 Frequency of micturition: Secondary | ICD-10-CM | POA: Diagnosis not present

## 2021-12-02 DIAGNOSIS — R739 Hyperglycemia, unspecified: Secondary | ICD-10-CM | POA: Diagnosis not present

## 2021-12-10 DIAGNOSIS — R000 Tachycardia, unspecified: Secondary | ICD-10-CM | POA: Diagnosis not present

## 2021-12-10 DIAGNOSIS — R911 Solitary pulmonary nodule: Secondary | ICD-10-CM | POA: Diagnosis not present

## 2021-12-10 DIAGNOSIS — F32A Depression, unspecified: Secondary | ICD-10-CM | POA: Diagnosis not present

## 2021-12-10 DIAGNOSIS — Z882 Allergy status to sulfonamides status: Secondary | ICD-10-CM | POA: Diagnosis not present

## 2021-12-10 DIAGNOSIS — J449 Chronic obstructive pulmonary disease, unspecified: Secondary | ICD-10-CM | POA: Diagnosis not present

## 2021-12-10 DIAGNOSIS — Z87440 Personal history of urinary (tract) infections: Secondary | ICD-10-CM | POA: Diagnosis not present

## 2021-12-10 DIAGNOSIS — Z79899 Other long term (current) drug therapy: Secondary | ICD-10-CM | POA: Diagnosis not present

## 2021-12-10 DIAGNOSIS — Z743 Need for continuous supervision: Secondary | ICD-10-CM | POA: Diagnosis not present

## 2021-12-10 DIAGNOSIS — Z9981 Dependence on supplemental oxygen: Secondary | ICD-10-CM | POA: Diagnosis not present

## 2021-12-10 DIAGNOSIS — R051 Acute cough: Secondary | ICD-10-CM | POA: Diagnosis not present

## 2021-12-10 DIAGNOSIS — J301 Allergic rhinitis due to pollen: Secondary | ICD-10-CM | POA: Diagnosis not present

## 2021-12-10 DIAGNOSIS — Z880 Allergy status to penicillin: Secondary | ICD-10-CM | POA: Diagnosis not present

## 2021-12-10 DIAGNOSIS — J9611 Chronic respiratory failure with hypoxia: Secondary | ICD-10-CM | POA: Diagnosis not present

## 2021-12-10 DIAGNOSIS — Z8673 Personal history of transient ischemic attack (TIA), and cerebral infarction without residual deficits: Secondary | ICD-10-CM | POA: Diagnosis not present

## 2021-12-10 DIAGNOSIS — I4891 Unspecified atrial fibrillation: Secondary | ICD-10-CM | POA: Diagnosis not present

## 2021-12-10 DIAGNOSIS — G4733 Obstructive sleep apnea (adult) (pediatric): Secondary | ICD-10-CM | POA: Diagnosis not present

## 2021-12-10 DIAGNOSIS — I499 Cardiac arrhythmia, unspecified: Secondary | ICD-10-CM | POA: Diagnosis not present

## 2021-12-10 DIAGNOSIS — Z89612 Acquired absence of left leg above knee: Secondary | ICD-10-CM | POA: Diagnosis not present

## 2021-12-10 DIAGNOSIS — R0789 Other chest pain: Secondary | ICD-10-CM | POA: Diagnosis not present

## 2021-12-10 DIAGNOSIS — Z885 Allergy status to narcotic agent status: Secondary | ICD-10-CM | POA: Diagnosis not present

## 2021-12-10 DIAGNOSIS — Z87891 Personal history of nicotine dependence: Secondary | ICD-10-CM | POA: Diagnosis not present

## 2021-12-10 DIAGNOSIS — R5383 Other fatigue: Secondary | ICD-10-CM | POA: Diagnosis not present

## 2021-12-10 DIAGNOSIS — Z03818 Encounter for observation for suspected exposure to other biological agents ruled out: Secondary | ICD-10-CM | POA: Diagnosis not present

## 2021-12-10 DIAGNOSIS — K219 Gastro-esophageal reflux disease without esophagitis: Secondary | ICD-10-CM | POA: Diagnosis not present

## 2021-12-10 DIAGNOSIS — I480 Paroxysmal atrial fibrillation: Secondary | ICD-10-CM | POA: Diagnosis not present

## 2021-12-10 DIAGNOSIS — Z91199 Patient's noncompliance with other medical treatment and regimen due to unspecified reason: Secondary | ICD-10-CM | POA: Diagnosis not present

## 2021-12-10 DIAGNOSIS — M1990 Unspecified osteoarthritis, unspecified site: Secondary | ICD-10-CM | POA: Diagnosis not present

## 2021-12-10 DIAGNOSIS — R079 Chest pain, unspecified: Secondary | ICD-10-CM | POA: Diagnosis not present

## 2021-12-10 DIAGNOSIS — E7800 Pure hypercholesterolemia, unspecified: Secondary | ICD-10-CM | POA: Diagnosis not present

## 2021-12-11 DIAGNOSIS — G4733 Obstructive sleep apnea (adult) (pediatric): Secondary | ICD-10-CM | POA: Diagnosis not present

## 2021-12-11 DIAGNOSIS — I480 Paroxysmal atrial fibrillation: Secondary | ICD-10-CM | POA: Diagnosis not present

## 2021-12-11 DIAGNOSIS — R079 Chest pain, unspecified: Secondary | ICD-10-CM | POA: Diagnosis not present

## 2021-12-14 ENCOUNTER — Ambulatory Visit (INDEPENDENT_AMBULATORY_CARE_PROVIDER_SITE_OTHER): Payer: Medicare Other

## 2021-12-14 ENCOUNTER — Other Ambulatory Visit: Payer: Self-pay

## 2021-12-14 DIAGNOSIS — I4891 Unspecified atrial fibrillation: Secondary | ICD-10-CM

## 2021-12-16 DIAGNOSIS — R2689 Other abnormalities of gait and mobility: Secondary | ICD-10-CM | POA: Diagnosis not present

## 2021-12-16 DIAGNOSIS — J449 Chronic obstructive pulmonary disease, unspecified: Secondary | ICD-10-CM | POA: Diagnosis not present

## 2021-12-17 DIAGNOSIS — M25512 Pain in left shoulder: Secondary | ICD-10-CM | POA: Diagnosis not present

## 2021-12-17 DIAGNOSIS — G8929 Other chronic pain: Secondary | ICD-10-CM | POA: Diagnosis not present

## 2021-12-28 DIAGNOSIS — J449 Chronic obstructive pulmonary disease, unspecified: Secondary | ICD-10-CM | POA: Diagnosis not present

## 2021-12-31 DIAGNOSIS — G4733 Obstructive sleep apnea (adult) (pediatric): Secondary | ICD-10-CM | POA: Diagnosis not present

## 2021-12-31 DIAGNOSIS — J449 Chronic obstructive pulmonary disease, unspecified: Secondary | ICD-10-CM | POA: Diagnosis not present

## 2021-12-31 DIAGNOSIS — R911 Solitary pulmonary nodule: Secondary | ICD-10-CM | POA: Diagnosis not present

## 2021-12-31 DIAGNOSIS — J9611 Chronic respiratory failure with hypoxia: Secondary | ICD-10-CM | POA: Diagnosis not present

## 2021-12-31 DIAGNOSIS — R5383 Other fatigue: Secondary | ICD-10-CM | POA: Diagnosis not present

## 2021-12-31 DIAGNOSIS — J301 Allergic rhinitis due to pollen: Secondary | ICD-10-CM | POA: Diagnosis not present

## 2022-01-01 DIAGNOSIS — M25512 Pain in left shoulder: Secondary | ICD-10-CM | POA: Diagnosis not present

## 2022-01-01 DIAGNOSIS — I4891 Unspecified atrial fibrillation: Secondary | ICD-10-CM | POA: Diagnosis not present

## 2022-01-01 DIAGNOSIS — Z139 Encounter for screening, unspecified: Secondary | ICD-10-CM | POA: Diagnosis not present

## 2022-01-01 DIAGNOSIS — J9611 Chronic respiratory failure with hypoxia: Secondary | ICD-10-CM | POA: Diagnosis not present

## 2022-01-12 ENCOUNTER — Telehealth: Payer: Self-pay

## 2022-01-12 DIAGNOSIS — M1990 Unspecified osteoarthritis, unspecified site: Secondary | ICD-10-CM | POA: Diagnosis not present

## 2022-01-12 DIAGNOSIS — Z9981 Dependence on supplemental oxygen: Secondary | ICD-10-CM | POA: Diagnosis not present

## 2022-01-12 DIAGNOSIS — J9611 Chronic respiratory failure with hypoxia: Secondary | ICD-10-CM | POA: Diagnosis not present

## 2022-01-12 DIAGNOSIS — I4891 Unspecified atrial fibrillation: Secondary | ICD-10-CM | POA: Diagnosis not present

## 2022-01-12 DIAGNOSIS — I4510 Unspecified right bundle-branch block: Secondary | ICD-10-CM | POA: Diagnosis not present

## 2022-01-12 DIAGNOSIS — Z79899 Other long term (current) drug therapy: Secondary | ICD-10-CM | POA: Diagnosis not present

## 2022-01-12 DIAGNOSIS — R6889 Other general symptoms and signs: Secondary | ICD-10-CM | POA: Diagnosis not present

## 2022-01-12 DIAGNOSIS — I499 Cardiac arrhythmia, unspecified: Secondary | ICD-10-CM | POA: Diagnosis not present

## 2022-01-12 DIAGNOSIS — D649 Anemia, unspecified: Secondary | ICD-10-CM | POA: Diagnosis not present

## 2022-01-12 DIAGNOSIS — Z886 Allergy status to analgesic agent status: Secondary | ICD-10-CM | POA: Diagnosis not present

## 2022-01-12 DIAGNOSIS — Z743 Need for continuous supervision: Secondary | ICD-10-CM | POA: Diagnosis not present

## 2022-01-12 DIAGNOSIS — G90522 Complex regional pain syndrome I of left lower limb: Secondary | ICD-10-CM | POA: Diagnosis not present

## 2022-01-12 DIAGNOSIS — Z882 Allergy status to sulfonamides status: Secondary | ICD-10-CM | POA: Diagnosis not present

## 2022-01-12 DIAGNOSIS — I509 Heart failure, unspecified: Secondary | ICD-10-CM | POA: Diagnosis not present

## 2022-01-12 DIAGNOSIS — E785 Hyperlipidemia, unspecified: Secondary | ICD-10-CM | POA: Diagnosis not present

## 2022-01-12 DIAGNOSIS — K219 Gastro-esophageal reflux disease without esophagitis: Secondary | ICD-10-CM | POA: Diagnosis not present

## 2022-01-12 DIAGNOSIS — Z7901 Long term (current) use of anticoagulants: Secondary | ICD-10-CM | POA: Diagnosis not present

## 2022-01-12 DIAGNOSIS — E041 Nontoxic single thyroid nodule: Secondary | ICD-10-CM | POA: Diagnosis not present

## 2022-01-12 DIAGNOSIS — I480 Paroxysmal atrial fibrillation: Secondary | ICD-10-CM | POA: Diagnosis not present

## 2022-01-12 DIAGNOSIS — Z89612 Acquired absence of left leg above knee: Secondary | ICD-10-CM | POA: Diagnosis not present

## 2022-01-12 DIAGNOSIS — R079 Chest pain, unspecified: Secondary | ICD-10-CM | POA: Diagnosis not present

## 2022-01-12 DIAGNOSIS — Z888 Allergy status to other drugs, medicaments and biological substances status: Secondary | ICD-10-CM | POA: Diagnosis not present

## 2022-01-12 DIAGNOSIS — Z8673 Personal history of transient ischemic attack (TIA), and cerebral infarction without residual deficits: Secondary | ICD-10-CM | POA: Diagnosis not present

## 2022-01-12 DIAGNOSIS — R55 Syncope and collapse: Secondary | ICD-10-CM | POA: Diagnosis not present

## 2022-01-12 DIAGNOSIS — Z87440 Personal history of urinary (tract) infections: Secondary | ICD-10-CM | POA: Diagnosis not present

## 2022-01-12 DIAGNOSIS — J449 Chronic obstructive pulmonary disease, unspecified: Secondary | ICD-10-CM | POA: Diagnosis not present

## 2022-01-12 DIAGNOSIS — E7800 Pure hypercholesterolemia, unspecified: Secondary | ICD-10-CM | POA: Diagnosis not present

## 2022-01-12 DIAGNOSIS — Z881 Allergy status to other antibiotic agents status: Secondary | ICD-10-CM | POA: Diagnosis not present

## 2022-01-12 DIAGNOSIS — R0602 Shortness of breath: Secondary | ICD-10-CM | POA: Diagnosis not present

## 2022-01-12 DIAGNOSIS — Z885 Allergy status to narcotic agent status: Secondary | ICD-10-CM | POA: Diagnosis not present

## 2022-01-12 DIAGNOSIS — R42 Dizziness and giddiness: Secondary | ICD-10-CM | POA: Diagnosis not present

## 2022-01-12 DIAGNOSIS — F32A Depression, unspecified: Secondary | ICD-10-CM | POA: Diagnosis not present

## 2022-01-12 DIAGNOSIS — Z87891 Personal history of nicotine dependence: Secondary | ICD-10-CM | POA: Diagnosis not present

## 2022-01-12 NOTE — Telephone Encounter (Signed)
-----   Message from Georgeanna Lea, MD sent at 01/08/2022 12:25 PM EDT ----- ?Monitor did not show any atrial fibrillation but did have 1 episode of ventricular tachycardia as well as 35 episode of supraventricular tachycardia, she is to have a follow-up to discuss this ?

## 2022-01-12 NOTE — Telephone Encounter (Signed)
Patient informed of results, schedule on [place] ?

## 2022-01-13 ENCOUNTER — Other Ambulatory Visit: Payer: Self-pay

## 2022-01-13 DIAGNOSIS — I4891 Unspecified atrial fibrillation: Secondary | ICD-10-CM | POA: Diagnosis not present

## 2022-01-13 DIAGNOSIS — E785 Hyperlipidemia, unspecified: Secondary | ICD-10-CM | POA: Diagnosis not present

## 2022-01-13 DIAGNOSIS — R0602 Shortness of breath: Secondary | ICD-10-CM | POA: Diagnosis not present

## 2022-01-13 DIAGNOSIS — I4510 Unspecified right bundle-branch block: Secondary | ICD-10-CM | POA: Diagnosis not present

## 2022-01-13 DIAGNOSIS — Z8673 Personal history of transient ischemic attack (TIA), and cerebral infarction without residual deficits: Secondary | ICD-10-CM | POA: Diagnosis not present

## 2022-01-14 DIAGNOSIS — R0602 Shortness of breath: Secondary | ICD-10-CM | POA: Diagnosis not present

## 2022-01-14 DIAGNOSIS — E785 Hyperlipidemia, unspecified: Secondary | ICD-10-CM | POA: Diagnosis not present

## 2022-01-14 DIAGNOSIS — I4510 Unspecified right bundle-branch block: Secondary | ICD-10-CM | POA: Diagnosis not present

## 2022-01-14 DIAGNOSIS — Z8673 Personal history of transient ischemic attack (TIA), and cerebral infarction without residual deficits: Secondary | ICD-10-CM | POA: Diagnosis not present

## 2022-01-14 DIAGNOSIS — I4891 Unspecified atrial fibrillation: Secondary | ICD-10-CM | POA: Diagnosis not present

## 2022-01-18 ENCOUNTER — Ambulatory Visit (INDEPENDENT_AMBULATORY_CARE_PROVIDER_SITE_OTHER): Payer: Medicare Other | Admitting: Cardiology

## 2022-01-18 ENCOUNTER — Encounter: Payer: Self-pay | Admitting: Cardiology

## 2022-01-18 VITALS — BP 126/70 | HR 80 | Ht 65.0 in

## 2022-01-18 DIAGNOSIS — Z8673 Personal history of transient ischemic attack (TIA), and cerebral infarction without residual deficits: Secondary | ICD-10-CM | POA: Diagnosis not present

## 2022-01-18 DIAGNOSIS — I4891 Unspecified atrial fibrillation: Secondary | ICD-10-CM | POA: Diagnosis not present

## 2022-01-18 DIAGNOSIS — I480 Paroxysmal atrial fibrillation: Secondary | ICD-10-CM

## 2022-01-18 DIAGNOSIS — Z89612 Acquired absence of left leg above knee: Secondary | ICD-10-CM | POA: Diagnosis not present

## 2022-01-18 DIAGNOSIS — I5032 Chronic diastolic (congestive) heart failure: Secondary | ICD-10-CM

## 2022-01-18 HISTORY — DX: Paroxysmal atrial fibrillation: I48.0

## 2022-01-18 NOTE — Addendum Note (Signed)
Addended by: Baldo Ash D on: 01/18/2022 01:44 PM   Modules accepted: Orders

## 2022-01-18 NOTE — Progress Notes (Signed)
Cardiology Office Note:    Date:  01/18/2022   ID:  Amanda Lewis, DOB [birth year], MRN [id number]  PCP:  Eunice Blase, PA-C  Cardiologist:  Gypsy Balsam, MD    Referring MD: Eunice Blase, PA-C   Chief Complaint  Patient presents with   Hospitalization Follow-up    History of Present Illness:    Amanda Lewis is a 66 y.o. female past medical history significant for essential hypertension, sleep apnea, depression, history of CVA, above-knee amputation of the left side years ago recently she was admitted to the hospital because of episode of atrial fibrillation interestingly couple weeks before that she was in our hospital with episode of atrial fibrillation, she converted spontaneously with diltiazem and then couple weeks later presented with exactly the same scenario she was not put on anticoagulation then she was not on any calcium channel blocker.  Similar scenario happened in the hospital calcium channel blocker convert her to sinus rhythm however this time because of CHADS2 Vascor equals 5 she is started on anticoagulation she was also put on Multaq.  She comes today to my office for follow-up.  Overall doing very well denies have any palpitations no chest pain tightness squeezing pressure burning chest.  Past Medical History:  Diagnosis Date   Anemia    Anxiety    on treatment   Arthritis    arms   Asthma    exertional   Chronic lower back pain    Community acquired bacterial pneumonia 07/20/2018   Complex regional pain syndrome of left lower extremity 01/13/2012   Constipation 07/20/2018   Last Assessment & Plan:  Formatting of this note might be different from the original. -continue senokot and miralax -added suppository and sorbitol today  -order KUB if develops abd pain/n/v   COPD (chronic obstructive pulmonary disease) (HCC)    usesO2 24/7   Depression    DNR (do not resuscitate)    Dysuria-frequency syndrome    GERD (gastroesophageal reflux disease)     Headache(784.0)    intermittent migraines   History of CVA (cerebrovascular accident) 07/18/2018   Last Assessment & Plan:  Formatting of this note might be different from the original. -not on antiplatelet therapy currently - has allergy (urticaria/hives) to aspirin  -would consider starting plavix monotherapy for secondary stroke prevention after completing DVT prophylaxis  -continue statin   History of home oxygen therapy 07/18/2018   History of kidney stones    Left acetabular fracture (HCC) 07/18/2018   Last Assessment & Plan:  Formatting of this note might be different from the original. -ortho plans for non operative management -Management per ortho primary team: PT/OT, wound care, weight bearing status and pain control -PT/OT rec SNF vs progression to home with HHPT   Left upper quadrant pain 07/18/2018   Last Assessment & Plan:  Formatting of this note might be different from the original. Lipase neg 2/2 8th rib fracture   -lidocaine patch  -encouraged IS   Leukocytosis 07/18/2018   Last Assessment & Plan:  Formatting of this note is different from the original. Lab Results  Component Value Date   WBC 14.9 (H) 07/20/2018  CXR and UA normal   -likely reactive, may be developing PNA -will start doxycycline -repeat CBC tomorrow   Major depression 07/18/2018   Last Assessment & Plan:  Formatting of this note might be different from the original. QTc 438 11/19  -continue home lexapro, wellbutrin, duloxetine, seroquel, and trazodone  -continue diazepam prn  -counseled  her on polypharmacy and asking her PCP to try to wean her off some of these medications   Multiple thyroid nodules    Pneumonia    Recurrent upper respiratory infection (URI)    Rotator cuff tear, right    RSD lower limb    left lower leg   S/P AKA (above knee amputation) (HCC) 07/19/2015   Sleep apnea 2010   uses continous oxygen   Stroke (HCC) 1997    Past Surgical History:  Procedure Laterality Date   ABDOMINAL  HYSTERECTOMY  2003   AMPUTATION  01/12/2012   Procedure: AMPUTATION ABOVE KNEE;  Surgeon: Nadara MustardMarcus V Duda, MD;  Location[facility];  Service: Orthopedics;  Laterality: Left;  Left Above Knee Amputation   APPENDECTOMY     FRACTURE SURGERY     Morphine Pump     Morphine Pump Removed  2006   ORIF ANKLE FRACTURE  1995   STUMP REVISION Left 07/19/2015   Procedure: Revision Left Above Knee Amputation;  Surgeon: Nadara MustardMarcus Duda V, MD;  [facility];  Service: Orthopedics;  Laterality: Left;    Current Medications: Current Meds  Medication Sig   acetaZOLAMIDE (DIAMOX) 500 MG capsule Take 1 capsule (500 mg total) by mouth daily.   albuterol (PROVENTIL HFA;VENTOLIN HFA) 108 (90 BASE) MCG/ACT inhaler Inhale 2 puffs into the lungs every 6 (six) hours as needed. For shortness of breath   alendronate (FOSAMAX) 70 MG tablet Take 70 mg by mouth once a week.   ANORO ELLIPTA 62.5-25 MCG/INH AEPB Inhale 1 puff into the lungs daily.   atorvastatin (LIPITOR) 80 MG tablet Take 80 mg by mouth daily.   cyclobenzaprine (FLEXERIL) 5 MG tablet Take 5 mg by mouth 2 (two) times daily as needed for spasms.   FLUoxetine (PROZAC) 20 MG capsule Take 20 mg by mouth every morning.   fluticasone (FLONASE) 50 MCG/ACT nasal spray Place 2 sprays into both nostrils daily.   gabapentin (NEURONTIN) 400 MG capsule Take 400 mg by mouth 3 (three) times daily.   hydrochlorothiazide (HYDRODIURIL) 25 MG tablet Take 0.5 tablets (12.5 mg total) by mouth daily.   loratadine (CLARITIN) 10 MG tablet Take 10 mg by mouth daily.   MYRBETRIQ 50 MG TB24 tablet Take 50 mg by mouth daily.   naproxen (NAPROSYN) 500 MG tablet Take 1 tablet by mouth every 12 (twelve) hours as needed for pain.   omeprazole (PRILOSEC) 40 MG capsule Take 40 mg by mouth daily.   QUEtiapine (SEROQUEL) 25 MG tablet Take 25 mg by mouth 2 (two) times daily.   traZODone (DESYREL) 100 MG tablet Take 100 mg by mouth at bedtime.   TRELEGY ELLIPTA 100-62.5-25 MCG/INH AEPB Inhale 1  puff into the lungs daily.     Allergies:   Aspirin, Bactrim [sulfamethoxazole-trimethoprim], Nitroglycerin, Oxycodone-aspirin, Percodan [oxycodone-aspirin], Sulfamethoxazole-trimethoprim, and Tape   Social History   Socioeconomic History   Marital status: Legally Separated    Spouse name: Not on file   Number of children: Not on file   Years of education: Not on file   Highest education level: Not on file  Occupational History   Occupation: disabled  Tobacco Use   Smoking status: Former    Packs/day: 2.00    Years: 35.00    Pack years: 70.00    Types: Cigarettes    Quit date: 09/27/2008    Years since quitting: 13.3   Smokeless tobacco: Never  Substance and Sexual Activity   Alcohol use: No   Drug use: No  Sexual activity: Not Currently    Birth control/protection: Surgical  Other Topics Concern   Not on file  Social History Narrative   Not on file   Social Determinants of Health   Financial Resource Strain: Not on file  Food Insecurity: Not on file  Transportation Needs: Not on file  Physical Activity: Not on file  Stress: Not on file  Social Connections: Not on file     Family History: The patient's family history is negative for Anesthesia problems. ROS:   Please see the history of present illness.    All 14 point review of systems negative except as described per history of present illness  EKGs/Labs/Other Studies Reviewed:      Recent Labs: No results found for requested labs within last 8760 hours.  Recent Lipid Panel No results found for: CHOL, TRIG, HDL, CHOLHDL, VLDL, LDLCALC, LDLDIRECT  Physical Exam:    VS:  BP 126/70 (BP Location: Right Arm, Patient Position: Sitting)   Pulse 80   Ht 5\' 5"  (1.651 m)   SpO2 93%   BMI 26.78 kg/m     Wt Readings from Last 3 Encounters:  12/24/20 160 lb 15 oz (73 kg)  07/19/15 162 lb (73.5 kg)  01/11/12 180 lb (81.6 kg)     GEN:  Well nourished, well developed in no acute distress HEENT:  Normal NECK: No JVD; No carotid bruits LYMPHATICS: No lymphadenopathy CARDIAC: RRR, no murmurs, no rubs, no gallops RESPIRATORY:  Clear to auscultation without rales, wheezing or rhonchi  ABDOMEN: Soft, non-tender, non-distended MUSCULOSKELETAL:  No edema; No deformity  SKIN: Warm and dry LOWER EXTREMITIES: no swelling NEUROLOGIC:  Alert and oriented x 3 PSYCHIATRIC:  Normal affect   ASSESSMENT:    1. Chronic heart failure with preserved ejection fraction (HCC)   2. Paroxysmal atrial fibrillation (HCC)   3. History of CVA (cerebrovascular accident)   4. Status post above-knee amputation of left lower extremity (HCC)    PLAN:    In order of problems listed above:  Paroxysmal atrial fibrillation CHADS2 Vascor equals 5.  She is anticoagulated she does have history of CVA.  Rhythm control is achieved with Multaq.  We will continue present management EKG looks good today.  We will continue present management. Chronic anticoagulation: She looks somewhat pale.  I will check her CBC today. Essential hypertension blood pressure seems to be controlled continue present management. Congestive heart failure: Compensated. I did review record from the hospital for that visit   Medication Adjustments/Labs and Tests Ordered: Current medicines are reviewed at length with the patient today.  Concerns regarding medicines are outlined above.  No orders of the defined types were placed in this encounter.  Medication changes: No orders of the defined types were placed in this encounter.   Signed, 01/13/12, MD, [facility] 01/18/2022 1:32 PM    [facility]

## 2022-01-18 NOTE — Addendum Note (Signed)
Addended by: Roddie Mc on: 01/18/2022 04:29 PM   Modules accepted: Orders

## 2022-01-18 NOTE — Patient Instructions (Signed)
Medication Instructions:  Your physician recommends that you continue on your current medications as directed. Please refer to the Current Medication list given to you today.  *If you need a refill on your cardiac medications before your next appointment, please call your pharmacy*   Lab Work: CBC - today If you have labs (blood work) drawn today and your tests are completely normal, you will receive your results only by: MyChart Message (if you have MyChart) OR A paper copy in the mail If you have any lab test that is abnormal or we need to change your treatment, we will call you to review the results.   Testing/Procedures: None Ordered   Follow-Up: At [facility], you and your health needs are our priority.  As part of our continuing mission to provide you with exceptional heart care, we have created designated Provider Care Teams.  These Care Teams include your primary Cardiologist (physician) and Advanced Practice Providers (APPs -  Physician Assistants and Nurse Practitioners) who all work together to provide you with the care you need, when you need it.  We recommend signing up for the patient portal called "MyChart".  Sign up information is provided on this After Visit Summary.  MyChart is used to connect with patients for Virtual Visits (Telemedicine).  Patients are able to view lab/test results, encounter notes, upcoming appointments, etc.  Non-urgent messages can be sent to your provider as well.   To learn more about what you can do with MyChart, go to [email or web address].    Your next appointment:   3 month(s)  The format for your next appointment:   In Person  Provider:   Robert Krasowski, MD    Other Instructions NA  

## 2022-01-19 DIAGNOSIS — Z87891 Personal history of nicotine dependence: Secondary | ICD-10-CM | POA: Diagnosis not present

## 2022-01-19 DIAGNOSIS — D649 Anemia, unspecified: Secondary | ICD-10-CM | POA: Diagnosis not present

## 2022-01-19 DIAGNOSIS — G4733 Obstructive sleep apnea (adult) (pediatric): Secondary | ICD-10-CM | POA: Diagnosis not present

## 2022-01-19 DIAGNOSIS — Z7951 Long term (current) use of inhaled steroids: Secondary | ICD-10-CM | POA: Diagnosis not present

## 2022-01-19 DIAGNOSIS — D72829 Elevated white blood cell count, unspecified: Secondary | ICD-10-CM | POA: Diagnosis not present

## 2022-01-19 DIAGNOSIS — Z8673 Personal history of transient ischemic attack (TIA), and cerebral infarction without residual deficits: Secondary | ICD-10-CM | POA: Diagnosis not present

## 2022-01-19 DIAGNOSIS — M1990 Unspecified osteoarthritis, unspecified site: Secondary | ICD-10-CM | POA: Diagnosis not present

## 2022-01-19 DIAGNOSIS — Z79899 Other long term (current) drug therapy: Secondary | ICD-10-CM | POA: Diagnosis not present

## 2022-01-19 DIAGNOSIS — I4891 Unspecified atrial fibrillation: Secondary | ICD-10-CM | POA: Diagnosis not present

## 2022-01-19 DIAGNOSIS — Z87440 Personal history of urinary (tract) infections: Secondary | ICD-10-CM | POA: Diagnosis not present

## 2022-01-19 DIAGNOSIS — Z89612 Acquired absence of left leg above knee: Secondary | ICD-10-CM | POA: Diagnosis not present

## 2022-01-19 DIAGNOSIS — J449 Chronic obstructive pulmonary disease, unspecified: Secondary | ICD-10-CM | POA: Diagnosis not present

## 2022-01-19 DIAGNOSIS — J9611 Chronic respiratory failure with hypoxia: Secondary | ICD-10-CM | POA: Diagnosis not present

## 2022-01-19 DIAGNOSIS — F32A Depression, unspecified: Secondary | ICD-10-CM | POA: Diagnosis not present

## 2022-01-19 DIAGNOSIS — K219 Gastro-esophageal reflux disease without esophagitis: Secondary | ICD-10-CM | POA: Diagnosis not present

## 2022-01-19 DIAGNOSIS — Z8711 Personal history of peptic ulcer disease: Secondary | ICD-10-CM | POA: Diagnosis not present

## 2022-01-19 DIAGNOSIS — I051 Rheumatic mitral insufficiency: Secondary | ICD-10-CM | POA: Diagnosis not present

## 2022-01-19 DIAGNOSIS — Z87442 Personal history of urinary calculi: Secondary | ICD-10-CM | POA: Diagnosis not present

## 2022-01-19 DIAGNOSIS — E7800 Pure hypercholesterolemia, unspecified: Secondary | ICD-10-CM | POA: Diagnosis not present

## 2022-01-19 DIAGNOSIS — Z9981 Dependence on supplemental oxygen: Secondary | ICD-10-CM | POA: Diagnosis not present

## 2022-01-19 DIAGNOSIS — I509 Heart failure, unspecified: Secondary | ICD-10-CM | POA: Diagnosis not present

## 2022-01-19 DIAGNOSIS — E041 Nontoxic single thyroid nodule: Secondary | ICD-10-CM | POA: Diagnosis not present

## 2022-01-19 DIAGNOSIS — Z7901 Long term (current) use of anticoagulants: Secondary | ICD-10-CM | POA: Diagnosis not present

## 2022-01-19 LAB — CBC
Hematocrit: 36.4 % (ref 34.0–46.6)
Hemoglobin: 11.3 g/dL (ref 11.1–15.9)
MCH: 25.7 pg — ABNORMAL LOW (ref 26.6–33.0)
MCHC: 31 g/dL — ABNORMAL LOW (ref 31.5–35.7)
MCV: 83 fL (ref 79–97)
Platelets: 269 10*3/uL (ref 150–450)
RBC: 4.39 x10E6/uL (ref 3.77–5.28)
RDW: 14.6 % (ref 11.7–15.4)
WBC: 10.8 10*3/uL (ref 3.4–10.8)

## 2022-01-22 ENCOUNTER — Telehealth: Payer: Self-pay

## 2022-01-22 DIAGNOSIS — J449 Chronic obstructive pulmonary disease, unspecified: Secondary | ICD-10-CM | POA: Diagnosis not present

## 2022-01-22 DIAGNOSIS — R0602 Shortness of breath: Secondary | ICD-10-CM | POA: Diagnosis not present

## 2022-01-22 DIAGNOSIS — R911 Solitary pulmonary nodule: Secondary | ICD-10-CM | POA: Diagnosis not present

## 2022-01-22 NOTE — Telephone Encounter (Signed)
Patient notified of results. Results forward to PCP. Advise to contact PCP to discuss medical treatment.

## 2022-01-22 NOTE — Telephone Encounter (Signed)
-----   Message from Park Liter, MD sent at 01/22/2022  9:05 AM EDT ----- CBC showed no significant anemia, but there is micro Citic hypochromic picture which is typical for iron deficiency anemia.  Patient need to talk to primary care physician about

## 2022-01-26 DIAGNOSIS — J449 Chronic obstructive pulmonary disease, unspecified: Secondary | ICD-10-CM | POA: Diagnosis not present

## 2022-01-26 DIAGNOSIS — Z79899 Other long term (current) drug therapy: Secondary | ICD-10-CM | POA: Diagnosis not present

## 2022-01-26 DIAGNOSIS — G8194 Hemiplegia, unspecified affecting left nondominant side: Secondary | ICD-10-CM | POA: Diagnosis not present

## 2022-01-26 DIAGNOSIS — I4891 Unspecified atrial fibrillation: Secondary | ICD-10-CM | POA: Diagnosis not present

## 2022-01-26 DIAGNOSIS — K5909 Other constipation: Secondary | ICD-10-CM | POA: Diagnosis not present

## 2022-01-28 DIAGNOSIS — J9611 Chronic respiratory failure with hypoxia: Secondary | ICD-10-CM | POA: Diagnosis not present

## 2022-01-28 DIAGNOSIS — Z87442 Personal history of urinary calculi: Secondary | ICD-10-CM | POA: Diagnosis not present

## 2022-01-28 DIAGNOSIS — Z79899 Other long term (current) drug therapy: Secondary | ICD-10-CM | POA: Diagnosis not present

## 2022-01-28 DIAGNOSIS — E041 Nontoxic single thyroid nodule: Secondary | ICD-10-CM | POA: Diagnosis not present

## 2022-01-28 DIAGNOSIS — Z8711 Personal history of peptic ulcer disease: Secondary | ICD-10-CM | POA: Diagnosis not present

## 2022-01-28 DIAGNOSIS — D72829 Elevated white blood cell count, unspecified: Secondary | ICD-10-CM | POA: Diagnosis not present

## 2022-01-28 DIAGNOSIS — Z7901 Long term (current) use of anticoagulants: Secondary | ICD-10-CM | POA: Diagnosis not present

## 2022-01-28 DIAGNOSIS — J449 Chronic obstructive pulmonary disease, unspecified: Secondary | ICD-10-CM | POA: Diagnosis not present

## 2022-01-28 DIAGNOSIS — Z87440 Personal history of urinary (tract) infections: Secondary | ICD-10-CM | POA: Diagnosis not present

## 2022-01-28 DIAGNOSIS — E7800 Pure hypercholesterolemia, unspecified: Secondary | ICD-10-CM | POA: Diagnosis not present

## 2022-01-28 DIAGNOSIS — D649 Anemia, unspecified: Secondary | ICD-10-CM | POA: Diagnosis not present

## 2022-01-28 DIAGNOSIS — I051 Rheumatic mitral insufficiency: Secondary | ICD-10-CM | POA: Diagnosis not present

## 2022-01-28 DIAGNOSIS — Z9981 Dependence on supplemental oxygen: Secondary | ICD-10-CM | POA: Diagnosis not present

## 2022-01-28 DIAGNOSIS — Z89612 Acquired absence of left leg above knee: Secondary | ICD-10-CM | POA: Diagnosis not present

## 2022-01-28 DIAGNOSIS — I4891 Unspecified atrial fibrillation: Secondary | ICD-10-CM | POA: Diagnosis not present

## 2022-01-28 DIAGNOSIS — M1990 Unspecified osteoarthritis, unspecified site: Secondary | ICD-10-CM | POA: Diagnosis not present

## 2022-01-28 DIAGNOSIS — Z87891 Personal history of nicotine dependence: Secondary | ICD-10-CM | POA: Diagnosis not present

## 2022-01-28 DIAGNOSIS — Z7951 Long term (current) use of inhaled steroids: Secondary | ICD-10-CM | POA: Diagnosis not present

## 2022-01-28 DIAGNOSIS — I509 Heart failure, unspecified: Secondary | ICD-10-CM | POA: Diagnosis not present

## 2022-01-28 DIAGNOSIS — G4733 Obstructive sleep apnea (adult) (pediatric): Secondary | ICD-10-CM | POA: Diagnosis not present

## 2022-01-28 DIAGNOSIS — K219 Gastro-esophageal reflux disease without esophagitis: Secondary | ICD-10-CM | POA: Diagnosis not present

## 2022-01-28 DIAGNOSIS — Z8673 Personal history of transient ischemic attack (TIA), and cerebral infarction without residual deficits: Secondary | ICD-10-CM | POA: Diagnosis not present

## 2022-01-28 DIAGNOSIS — F32A Depression, unspecified: Secondary | ICD-10-CM | POA: Diagnosis not present

## 2022-02-04 DIAGNOSIS — Z87440 Personal history of urinary (tract) infections: Secondary | ICD-10-CM | POA: Diagnosis not present

## 2022-02-04 DIAGNOSIS — F32A Depression, unspecified: Secondary | ICD-10-CM | POA: Diagnosis not present

## 2022-02-04 DIAGNOSIS — Z87442 Personal history of urinary calculi: Secondary | ICD-10-CM | POA: Diagnosis not present

## 2022-02-04 DIAGNOSIS — K219 Gastro-esophageal reflux disease without esophagitis: Secondary | ICD-10-CM | POA: Diagnosis not present

## 2022-02-04 DIAGNOSIS — J449 Chronic obstructive pulmonary disease, unspecified: Secondary | ICD-10-CM | POA: Diagnosis not present

## 2022-02-04 DIAGNOSIS — E7800 Pure hypercholesterolemia, unspecified: Secondary | ICD-10-CM | POA: Diagnosis not present

## 2022-02-04 DIAGNOSIS — E041 Nontoxic single thyroid nodule: Secondary | ICD-10-CM | POA: Diagnosis not present

## 2022-02-04 DIAGNOSIS — Z89612 Acquired absence of left leg above knee: Secondary | ICD-10-CM | POA: Diagnosis not present

## 2022-02-04 DIAGNOSIS — Z7951 Long term (current) use of inhaled steroids: Secondary | ICD-10-CM | POA: Diagnosis not present

## 2022-02-04 DIAGNOSIS — G4733 Obstructive sleep apnea (adult) (pediatric): Secondary | ICD-10-CM | POA: Diagnosis not present

## 2022-02-04 DIAGNOSIS — I509 Heart failure, unspecified: Secondary | ICD-10-CM | POA: Diagnosis not present

## 2022-02-04 DIAGNOSIS — Z8673 Personal history of transient ischemic attack (TIA), and cerebral infarction without residual deficits: Secondary | ICD-10-CM | POA: Diagnosis not present

## 2022-02-04 DIAGNOSIS — Z8711 Personal history of peptic ulcer disease: Secondary | ICD-10-CM | POA: Diagnosis not present

## 2022-02-04 DIAGNOSIS — D72829 Elevated white blood cell count, unspecified: Secondary | ICD-10-CM | POA: Diagnosis not present

## 2022-02-04 DIAGNOSIS — M1990 Unspecified osteoarthritis, unspecified site: Secondary | ICD-10-CM | POA: Diagnosis not present

## 2022-02-04 DIAGNOSIS — Z7901 Long term (current) use of anticoagulants: Secondary | ICD-10-CM | POA: Diagnosis not present

## 2022-02-04 DIAGNOSIS — D649 Anemia, unspecified: Secondary | ICD-10-CM | POA: Diagnosis not present

## 2022-02-04 DIAGNOSIS — Z87891 Personal history of nicotine dependence: Secondary | ICD-10-CM | POA: Diagnosis not present

## 2022-02-04 DIAGNOSIS — I4891 Unspecified atrial fibrillation: Secondary | ICD-10-CM | POA: Diagnosis not present

## 2022-02-04 DIAGNOSIS — I051 Rheumatic mitral insufficiency: Secondary | ICD-10-CM | POA: Diagnosis not present

## 2022-02-04 DIAGNOSIS — J9611 Chronic respiratory failure with hypoxia: Secondary | ICD-10-CM | POA: Diagnosis not present

## 2022-02-04 DIAGNOSIS — Z9981 Dependence on supplemental oxygen: Secondary | ICD-10-CM | POA: Diagnosis not present

## 2022-02-04 DIAGNOSIS — Z79899 Other long term (current) drug therapy: Secondary | ICD-10-CM | POA: Diagnosis not present

## 2022-02-09 ENCOUNTER — Other Ambulatory Visit: Payer: Self-pay

## 2022-02-09 ENCOUNTER — Telehealth: Payer: Self-pay | Admitting: Cardiology

## 2022-02-09 MED ORDER — APIXABAN 5 MG PO TABS
5.0000 mg | ORAL_TABLET | Freq: Two times a day (BID) | ORAL | 1 refills | Status: DC
Start: 2022-02-09 — End: 2022-04-14

## 2022-02-09 MED ORDER — DRONEDARONE HCL 400 MG PO TABS: 400.0000 mg | ORAL_TABLET | Freq: Two times a day (BID) | ORAL | 3 refills | Status: DC

## 2022-02-09 NOTE — Telephone Encounter (Signed)
Pt c/o medication issue:  1. Name of Medication: Multaq, Eliquis  2. How are you currently taking this medication (dosage and times per day)? NA  3. Are you having a reaction (difficulty breathing--STAT)? NO  4. What is your medication issue? Pt is requesting refill on above medications. I cdid not see medications on Med List. Pt states that they were prescribed to her while in [facility] by Dr. Kirtland Bouchard. Please advise

## 2022-02-09 NOTE — Telephone Encounter (Addendum)
Prescription refill request for Eliquis received.   Indication: afib  Last office visit: Bing Matter, 01/18/2022 Scr: 0.5, 01/14/22 Age: 66 yo  Weight: 73 kg

## 2022-02-09 NOTE — Telephone Encounter (Signed)
Discussed with Edyth Gunnels who has the original document from [facility]. Misty Stanley stated Eliquis 5mg  BID is d/c medication. In media note from 01/20/2022 says anticoagulated.   Will send in Eliquis 5mg  BID.

## 2022-02-19 DIAGNOSIS — I509 Heart failure, unspecified: Secondary | ICD-10-CM | POA: Diagnosis not present

## 2022-02-19 DIAGNOSIS — Z7951 Long term (current) use of inhaled steroids: Secondary | ICD-10-CM | POA: Diagnosis not present

## 2022-02-19 DIAGNOSIS — Z87442 Personal history of urinary calculi: Secondary | ICD-10-CM | POA: Diagnosis not present

## 2022-02-19 DIAGNOSIS — D649 Anemia, unspecified: Secondary | ICD-10-CM | POA: Diagnosis not present

## 2022-02-19 DIAGNOSIS — J449 Chronic obstructive pulmonary disease, unspecified: Secondary | ICD-10-CM | POA: Diagnosis not present

## 2022-02-19 DIAGNOSIS — Z79899 Other long term (current) drug therapy: Secondary | ICD-10-CM | POA: Diagnosis not present

## 2022-02-19 DIAGNOSIS — Z7901 Long term (current) use of anticoagulants: Secondary | ICD-10-CM | POA: Diagnosis not present

## 2022-02-19 DIAGNOSIS — M1990 Unspecified osteoarthritis, unspecified site: Secondary | ICD-10-CM | POA: Diagnosis not present

## 2022-02-19 DIAGNOSIS — F32A Depression, unspecified: Secondary | ICD-10-CM | POA: Diagnosis not present

## 2022-02-19 DIAGNOSIS — Z87440 Personal history of urinary (tract) infections: Secondary | ICD-10-CM | POA: Diagnosis not present

## 2022-02-19 DIAGNOSIS — E7800 Pure hypercholesterolemia, unspecified: Secondary | ICD-10-CM | POA: Diagnosis not present

## 2022-02-19 DIAGNOSIS — Z87891 Personal history of nicotine dependence: Secondary | ICD-10-CM | POA: Diagnosis not present

## 2022-02-19 DIAGNOSIS — K219 Gastro-esophageal reflux disease without esophagitis: Secondary | ICD-10-CM | POA: Diagnosis not present

## 2022-02-19 DIAGNOSIS — E041 Nontoxic single thyroid nodule: Secondary | ICD-10-CM | POA: Diagnosis not present

## 2022-02-19 DIAGNOSIS — G4733 Obstructive sleep apnea (adult) (pediatric): Secondary | ICD-10-CM | POA: Diagnosis not present

## 2022-02-19 DIAGNOSIS — Z8711 Personal history of peptic ulcer disease: Secondary | ICD-10-CM | POA: Diagnosis not present

## 2022-02-19 DIAGNOSIS — Z8673 Personal history of transient ischemic attack (TIA), and cerebral infarction without residual deficits: Secondary | ICD-10-CM | POA: Diagnosis not present

## 2022-02-19 DIAGNOSIS — J9611 Chronic respiratory failure with hypoxia: Secondary | ICD-10-CM | POA: Diagnosis not present

## 2022-02-19 DIAGNOSIS — D72829 Elevated white blood cell count, unspecified: Secondary | ICD-10-CM | POA: Diagnosis not present

## 2022-02-19 DIAGNOSIS — I051 Rheumatic mitral insufficiency: Secondary | ICD-10-CM | POA: Diagnosis not present

## 2022-02-19 DIAGNOSIS — Z9981 Dependence on supplemental oxygen: Secondary | ICD-10-CM | POA: Diagnosis not present

## 2022-02-19 DIAGNOSIS — I4891 Unspecified atrial fibrillation: Secondary | ICD-10-CM | POA: Diagnosis not present

## 2022-02-19 DIAGNOSIS — Z89612 Acquired absence of left leg above knee: Secondary | ICD-10-CM | POA: Diagnosis not present

## 2022-02-25 DIAGNOSIS — R109 Unspecified abdominal pain: Secondary | ICD-10-CM | POA: Diagnosis not present

## 2022-02-25 DIAGNOSIS — R0602 Shortness of breath: Secondary | ICD-10-CM | POA: Diagnosis not present

## 2022-02-25 DIAGNOSIS — D72829 Elevated white blood cell count, unspecified: Secondary | ICD-10-CM | POA: Diagnosis not present

## 2022-02-25 DIAGNOSIS — Z7951 Long term (current) use of inhaled steroids: Secondary | ICD-10-CM | POA: Diagnosis not present

## 2022-02-25 DIAGNOSIS — R079 Chest pain, unspecified: Secondary | ICD-10-CM | POA: Diagnosis not present

## 2022-02-25 DIAGNOSIS — G4733 Obstructive sleep apnea (adult) (pediatric): Secondary | ICD-10-CM | POA: Diagnosis not present

## 2022-02-25 DIAGNOSIS — E041 Nontoxic single thyroid nodule: Secondary | ICD-10-CM | POA: Diagnosis not present

## 2022-02-25 DIAGNOSIS — I051 Rheumatic mitral insufficiency: Secondary | ICD-10-CM | POA: Diagnosis not present

## 2022-02-25 DIAGNOSIS — I110 Hypertensive heart disease with heart failure: Secondary | ICD-10-CM | POA: Diagnosis not present

## 2022-02-25 DIAGNOSIS — M1990 Unspecified osteoarthritis, unspecified site: Secondary | ICD-10-CM | POA: Diagnosis not present

## 2022-02-25 DIAGNOSIS — M79604 Pain in right leg: Secondary | ICD-10-CM | POA: Diagnosis not present

## 2022-02-25 DIAGNOSIS — A419 Sepsis, unspecified organism: Secondary | ICD-10-CM | POA: Diagnosis not present

## 2022-02-25 DIAGNOSIS — Z743 Need for continuous supervision: Secondary | ICD-10-CM | POA: Diagnosis not present

## 2022-02-25 DIAGNOSIS — J9611 Chronic respiratory failure with hypoxia: Secondary | ICD-10-CM | POA: Diagnosis not present

## 2022-02-25 DIAGNOSIS — K219 Gastro-esophageal reflux disease without esophagitis: Secondary | ICD-10-CM | POA: Diagnosis not present

## 2022-02-25 DIAGNOSIS — Z79899 Other long term (current) drug therapy: Secondary | ICD-10-CM | POA: Diagnosis not present

## 2022-02-25 DIAGNOSIS — Z7901 Long term (current) use of anticoagulants: Secondary | ICD-10-CM | POA: Diagnosis not present

## 2022-02-25 DIAGNOSIS — Z87440 Personal history of urinary (tract) infections: Secondary | ICD-10-CM | POA: Diagnosis not present

## 2022-02-25 DIAGNOSIS — I4891 Unspecified atrial fibrillation: Secondary | ICD-10-CM | POA: Diagnosis not present

## 2022-02-25 DIAGNOSIS — Z9981 Dependence on supplemental oxygen: Secondary | ICD-10-CM | POA: Diagnosis not present

## 2022-02-25 DIAGNOSIS — R0902 Hypoxemia: Secondary | ICD-10-CM | POA: Diagnosis not present

## 2022-02-25 DIAGNOSIS — F32A Depression, unspecified: Secondary | ICD-10-CM | POA: Diagnosis not present

## 2022-02-25 DIAGNOSIS — R0789 Other chest pain: Secondary | ICD-10-CM | POA: Diagnosis not present

## 2022-02-25 DIAGNOSIS — J449 Chronic obstructive pulmonary disease, unspecified: Secondary | ICD-10-CM | POA: Diagnosis not present

## 2022-02-25 DIAGNOSIS — Z87442 Personal history of urinary calculi: Secondary | ICD-10-CM | POA: Diagnosis not present

## 2022-02-25 DIAGNOSIS — Z87891 Personal history of nicotine dependence: Secondary | ICD-10-CM | POA: Diagnosis not present

## 2022-02-25 DIAGNOSIS — Z89612 Acquired absence of left leg above knee: Secondary | ICD-10-CM | POA: Diagnosis not present

## 2022-02-25 DIAGNOSIS — Z8673 Personal history of transient ischemic attack (TIA), and cerebral infarction without residual deficits: Secondary | ICD-10-CM | POA: Diagnosis not present

## 2022-02-25 DIAGNOSIS — R300 Dysuria: Secondary | ICD-10-CM | POA: Diagnosis not present

## 2022-02-25 DIAGNOSIS — N200 Calculus of kidney: Secondary | ICD-10-CM | POA: Diagnosis not present

## 2022-02-25 DIAGNOSIS — E7800 Pure hypercholesterolemia, unspecified: Secondary | ICD-10-CM | POA: Diagnosis not present

## 2022-02-25 DIAGNOSIS — I509 Heart failure, unspecified: Secondary | ICD-10-CM | POA: Diagnosis not present

## 2022-02-25 DIAGNOSIS — Z8711 Personal history of peptic ulcer disease: Secondary | ICD-10-CM | POA: Diagnosis not present

## 2022-02-25 DIAGNOSIS — R1032 Left lower quadrant pain: Secondary | ICD-10-CM | POA: Diagnosis not present

## 2022-02-25 DIAGNOSIS — D649 Anemia, unspecified: Secondary | ICD-10-CM | POA: Diagnosis not present

## 2022-02-25 DIAGNOSIS — R1012 Left upper quadrant pain: Secondary | ICD-10-CM | POA: Diagnosis not present

## 2022-02-25 DIAGNOSIS — Z7401 Bed confinement status: Secondary | ICD-10-CM | POA: Diagnosis not present

## 2022-02-26 DIAGNOSIS — I10 Essential (primary) hypertension: Secondary | ICD-10-CM | POA: Diagnosis not present

## 2022-02-26 DIAGNOSIS — S161XXA Strain of muscle, fascia and tendon at neck level, initial encounter: Secondary | ICD-10-CM | POA: Diagnosis not present

## 2022-02-26 DIAGNOSIS — Z743 Need for continuous supervision: Secondary | ICD-10-CM | POA: Diagnosis not present

## 2022-02-26 DIAGNOSIS — M542 Cervicalgia: Secondary | ICD-10-CM | POA: Diagnosis not present

## 2022-02-26 DIAGNOSIS — R531 Weakness: Secondary | ICD-10-CM | POA: Diagnosis not present

## 2022-02-26 DIAGNOSIS — N200 Calculus of kidney: Secondary | ICD-10-CM | POA: Diagnosis not present

## 2022-02-26 DIAGNOSIS — J441 Chronic obstructive pulmonary disease with (acute) exacerbation: Secondary | ICD-10-CM | POA: Diagnosis not present

## 2022-02-26 DIAGNOSIS — I4891 Unspecified atrial fibrillation: Secondary | ICD-10-CM | POA: Diagnosis not present

## 2022-02-26 DIAGNOSIS — J449 Chronic obstructive pulmonary disease, unspecified: Secondary | ICD-10-CM | POA: Diagnosis not present

## 2022-02-26 DIAGNOSIS — R519 Headache, unspecified: Secondary | ICD-10-CM | POA: Diagnosis not present

## 2022-02-26 DIAGNOSIS — R0602 Shortness of breath: Secondary | ICD-10-CM | POA: Diagnosis not present

## 2022-02-26 DIAGNOSIS — R279 Unspecified lack of coordination: Secondary | ICD-10-CM | POA: Diagnosis not present

## 2022-02-26 DIAGNOSIS — I700 Atherosclerosis of aorta: Secondary | ICD-10-CM | POA: Diagnosis not present

## 2022-02-26 DIAGNOSIS — N390 Urinary tract infection, site not specified: Secondary | ICD-10-CM | POA: Diagnosis not present

## 2022-02-27 DIAGNOSIS — J449 Chronic obstructive pulmonary disease, unspecified: Secondary | ICD-10-CM | POA: Diagnosis not present

## 2022-03-04 ENCOUNTER — Telehealth: Payer: Self-pay | Admitting: Cardiology

## 2022-03-04 NOTE — Telephone Encounter (Signed)
Pt c/o medication issue:  1. Name of Medication:  Methocarbamol 500 MG Azithromycin 250 MG Diclofenac  Tylenol   2. How are you currently taking this medication (dosage and times per day)? Not currently taking   3. Are you having a reaction (difficulty breathing--STAT)? No   4. What is your medication issue? Patient states Dr. Hyacinth Meeker from [facility] prescribed these for her and she wants to know if Dr. Bing Matter feels she needs to be taking them.

## 2022-03-05 ENCOUNTER — Telehealth: Payer: Self-pay

## 2022-03-05 DIAGNOSIS — M1990 Unspecified osteoarthritis, unspecified site: Secondary | ICD-10-CM | POA: Diagnosis not present

## 2022-03-05 DIAGNOSIS — E041 Nontoxic single thyroid nodule: Secondary | ICD-10-CM | POA: Diagnosis not present

## 2022-03-05 DIAGNOSIS — I4891 Unspecified atrial fibrillation: Secondary | ICD-10-CM | POA: Diagnosis not present

## 2022-03-05 DIAGNOSIS — Z8673 Personal history of transient ischemic attack (TIA), and cerebral infarction without residual deficits: Secondary | ICD-10-CM | POA: Diagnosis not present

## 2022-03-05 DIAGNOSIS — Z7951 Long term (current) use of inhaled steroids: Secondary | ICD-10-CM | POA: Diagnosis not present

## 2022-03-05 DIAGNOSIS — D72829 Elevated white blood cell count, unspecified: Secondary | ICD-10-CM | POA: Diagnosis not present

## 2022-03-05 DIAGNOSIS — J9611 Chronic respiratory failure with hypoxia: Secondary | ICD-10-CM | POA: Diagnosis not present

## 2022-03-05 DIAGNOSIS — I509 Heart failure, unspecified: Secondary | ICD-10-CM | POA: Diagnosis not present

## 2022-03-05 DIAGNOSIS — E7800 Pure hypercholesterolemia, unspecified: Secondary | ICD-10-CM | POA: Diagnosis not present

## 2022-03-05 DIAGNOSIS — F32A Depression, unspecified: Secondary | ICD-10-CM | POA: Diagnosis not present

## 2022-03-05 DIAGNOSIS — Z89612 Acquired absence of left leg above knee: Secondary | ICD-10-CM | POA: Diagnosis not present

## 2022-03-05 DIAGNOSIS — J449 Chronic obstructive pulmonary disease, unspecified: Secondary | ICD-10-CM | POA: Diagnosis not present

## 2022-03-05 DIAGNOSIS — Z9981 Dependence on supplemental oxygen: Secondary | ICD-10-CM | POA: Diagnosis not present

## 2022-03-05 DIAGNOSIS — Z79899 Other long term (current) drug therapy: Secondary | ICD-10-CM | POA: Diagnosis not present

## 2022-03-05 DIAGNOSIS — D649 Anemia, unspecified: Secondary | ICD-10-CM | POA: Diagnosis not present

## 2022-03-05 DIAGNOSIS — Z87440 Personal history of urinary (tract) infections: Secondary | ICD-10-CM | POA: Diagnosis not present

## 2022-03-05 DIAGNOSIS — Z7901 Long term (current) use of anticoagulants: Secondary | ICD-10-CM | POA: Diagnosis not present

## 2022-03-05 DIAGNOSIS — Z8711 Personal history of peptic ulcer disease: Secondary | ICD-10-CM | POA: Diagnosis not present

## 2022-03-05 DIAGNOSIS — G4733 Obstructive sleep apnea (adult) (pediatric): Secondary | ICD-10-CM | POA: Diagnosis not present

## 2022-03-05 DIAGNOSIS — I051 Rheumatic mitral insufficiency: Secondary | ICD-10-CM | POA: Diagnosis not present

## 2022-03-05 DIAGNOSIS — Z87442 Personal history of urinary calculi: Secondary | ICD-10-CM | POA: Diagnosis not present

## 2022-03-05 DIAGNOSIS — K219 Gastro-esophageal reflux disease without esophagitis: Secondary | ICD-10-CM | POA: Diagnosis not present

## 2022-03-05 DIAGNOSIS — Z87891 Personal history of nicotine dependence: Secondary | ICD-10-CM | POA: Diagnosis not present

## 2022-03-05 NOTE — Telephone Encounter (Signed)
Spoke with pt about message and Dr. Vanetta Shawl comments. She stated that she was doing ok. Encouraged her to follow up with her PCP if she has any non cardiac problems. She verbalized understanding and had no questions or concerns.

## 2022-03-29 DIAGNOSIS — Z79899 Other long term (current) drug therapy: Secondary | ICD-10-CM | POA: Diagnosis not present

## 2022-03-29 DIAGNOSIS — R600 Localized edema: Secondary | ICD-10-CM | POA: Diagnosis not present

## 2022-03-29 DIAGNOSIS — K5909 Other constipation: Secondary | ICD-10-CM | POA: Diagnosis not present

## 2022-03-29 DIAGNOSIS — R739 Hyperglycemia, unspecified: Secondary | ICD-10-CM | POA: Diagnosis not present

## 2022-03-29 DIAGNOSIS — M2550 Pain in unspecified joint: Secondary | ICD-10-CM | POA: Diagnosis not present

## 2022-03-29 DIAGNOSIS — J449 Chronic obstructive pulmonary disease, unspecified: Secondary | ICD-10-CM | POA: Diagnosis not present

## 2022-03-29 DIAGNOSIS — E785 Hyperlipidemia, unspecified: Secondary | ICD-10-CM | POA: Diagnosis not present

## 2022-03-30 DIAGNOSIS — J449 Chronic obstructive pulmonary disease, unspecified: Secondary | ICD-10-CM | POA: Diagnosis not present

## 2022-04-08 ENCOUNTER — Telehealth: Payer: Self-pay | Admitting: Cardiology

## 2022-04-08 NOTE — Telephone Encounter (Signed)
*  STAT* If patient is at the pharmacy, call can be transferred to refill team.   1. Which medications need to be refilled? (please list name of each medication and dose if known) diltiazem 120 mg  2. Which pharmacy/location (including street and city if local pharmacy) is medication to be sent to? [id number] - [place], [place] - [address]  3. Do they need a 30 day or 90 day supply? 90   Pt's CNA, Mardella Layman, states that the pt started this medication in the hospital and would like to have this refilled.

## 2022-04-09 DIAGNOSIS — R0602 Shortness of breath: Secondary | ICD-10-CM | POA: Diagnosis not present

## 2022-04-09 DIAGNOSIS — Z743 Need for continuous supervision: Secondary | ICD-10-CM | POA: Diagnosis not present

## 2022-04-09 DIAGNOSIS — J440 Chronic obstructive pulmonary disease with acute lower respiratory infection: Secondary | ICD-10-CM | POA: Diagnosis not present

## 2022-04-09 DIAGNOSIS — Z760 Encounter for issue of repeat prescription: Secondary | ICD-10-CM | POA: Diagnosis not present

## 2022-04-09 DIAGNOSIS — Z79899 Other long term (current) drug therapy: Secondary | ICD-10-CM | POA: Diagnosis not present

## 2022-04-09 DIAGNOSIS — Z7952 Long term (current) use of systemic steroids: Secondary | ICD-10-CM | POA: Diagnosis not present

## 2022-04-09 DIAGNOSIS — R0789 Other chest pain: Secondary | ICD-10-CM | POA: Diagnosis not present

## 2022-04-09 DIAGNOSIS — R079 Chest pain, unspecified: Secondary | ICD-10-CM | POA: Diagnosis not present

## 2022-04-09 DIAGNOSIS — J209 Acute bronchitis, unspecified: Secondary | ICD-10-CM | POA: Diagnosis not present

## 2022-04-09 DIAGNOSIS — Z792 Long term (current) use of antibiotics: Secondary | ICD-10-CM | POA: Diagnosis not present

## 2022-04-09 DIAGNOSIS — Z20822 Contact with and (suspected) exposure to covid-19: Secondary | ICD-10-CM | POA: Diagnosis not present

## 2022-04-12 ENCOUNTER — Telehealth: Payer: Self-pay | Admitting: Cardiology

## 2022-04-12 ENCOUNTER — Encounter: Payer: Self-pay | Admitting: Cardiology

## 2022-04-12 MED ORDER — DILTIAZEM HCL ER COATED BEADS 120 MG PO CP24: 120.0000 mg | ORAL_CAPSULE | Freq: Every day | ORAL | 0 refills | Status: DC

## 2022-04-12 NOTE — Telephone Encounter (Signed)
Patient notified Rx sent

## 2022-04-12 NOTE — Telephone Encounter (Signed)
Medication sent per RJK, unable to speak with the patient as she was a sleep. Patient has no one on DPR so unable to speak to any one. Request a call through a friend.

## 2022-04-12 NOTE — Telephone Encounter (Signed)
Follow Up:      Patient is returning Javanna's call from today. 

## 2022-04-14 ENCOUNTER — Other Ambulatory Visit: Payer: Self-pay | Admitting: Cardiology

## 2022-04-14 ENCOUNTER — Telehealth: Payer: Self-pay

## 2022-04-14 DIAGNOSIS — I480 Paroxysmal atrial fibrillation: Secondary | ICD-10-CM

## 2022-04-14 NOTE — Telephone Encounter (Signed)
        Patient  visited [place]    Telephone encounter attempt : 1ST   A HIPAA compliant voice message was left requesting a return call.  Instructed patient to call back  at earliest convenience. Lenard Forth Care Guide, Embedded Care Coordination [facility], Care Management  [phone] [address] [place], [place], [place] [address] Phone: [phone] Email: [email or web address]

## 2022-04-14 NOTE — Telephone Encounter (Signed)
Eliquis 5mg  refill request received. Patient is 66 years old, weight-73kg, Crea-0.60 on 03/29/2022 via K[facility] from [place], [place], and last seen by Dr. [place] on 01/18/2022 . Dose is appropriate based on dosing criteria. Will send in refill to requested pharmacy.

## 2022-04-19 DIAGNOSIS — J40 Bronchitis, not specified as acute or chronic: Secondary | ICD-10-CM | POA: Diagnosis not present

## 2022-04-19 DIAGNOSIS — R079 Chest pain, unspecified: Secondary | ICD-10-CM | POA: Diagnosis not present

## 2022-04-19 DIAGNOSIS — Z743 Need for continuous supervision: Secondary | ICD-10-CM | POA: Diagnosis not present

## 2022-04-19 DIAGNOSIS — R0789 Other chest pain: Secondary | ICD-10-CM | POA: Diagnosis not present

## 2022-04-19 DIAGNOSIS — S88119A Complete traumatic amputation at level between knee and ankle, unspecified lower leg, initial encounter: Secondary | ICD-10-CM | POA: Diagnosis not present

## 2022-04-19 DIAGNOSIS — R6889 Other general symptoms and signs: Secondary | ICD-10-CM | POA: Diagnosis not present

## 2022-04-19 DIAGNOSIS — Z20822 Contact with and (suspected) exposure to covid-19: Secondary | ICD-10-CM | POA: Diagnosis not present

## 2022-04-19 DIAGNOSIS — R5381 Other malaise: Secondary | ICD-10-CM | POA: Diagnosis not present

## 2022-04-19 NOTE — Telephone Encounter (Signed)
Patient notified

## 2022-04-20 ENCOUNTER — Ambulatory Visit (INDEPENDENT_AMBULATORY_CARE_PROVIDER_SITE_OTHER): Payer: Medicare Other | Admitting: Cardiology

## 2022-04-20 ENCOUNTER — Encounter: Payer: Self-pay | Admitting: Cardiology

## 2022-04-20 VITALS — BP 110/72 | HR 82 | Ht 65.5 in | Wt 183.0 lb

## 2022-04-20 DIAGNOSIS — Z8673 Personal history of transient ischemic attack (TIA), and cerebral infarction without residual deficits: Secondary | ICD-10-CM

## 2022-04-20 DIAGNOSIS — J410 Simple chronic bronchitis: Secondary | ICD-10-CM

## 2022-04-20 DIAGNOSIS — I480 Paroxysmal atrial fibrillation: Secondary | ICD-10-CM

## 2022-04-20 DIAGNOSIS — I5032 Chronic diastolic (congestive) heart failure: Secondary | ICD-10-CM | POA: Diagnosis not present

## 2022-04-20 NOTE — Patient Instructions (Signed)

## 2022-04-20 NOTE — Progress Notes (Signed)
Cardiology Office Note:    Date:  04/20/2022   ID:  Amanda Lewis, DOB 1955/09/05, MRN [id number]  PCP:  Eunice Blase, PA-C  Cardiologist:  Gypsy Balsam, MD    Referring MD: Eunice Blase, PA-C   Chief Complaint  Patient presents with   Follow-up    History of Present Illness:    Amanda Lewis is a 66 y.o. 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  She was put on anticoagulation because of CHA2DS2-VASc being 5, she was also put on Multaq.  Since that time she seems to be doing well.  She denies have any palpitations.  Yesterday she ended up going to the hospital because of chest pain pain was very atypical pain was worse with coughing and taking deep breath.  It was worse with palpation chest wall, CT of the chest has been done with contrast showed no evidence of PE.  She was discharged home with steroids, she started taking steroids pain completely subsided  Past Medical History:  Diagnosis Date   Anemia    Anxiety    on treatment   Arthritis    arms   Asthma    exertional   Chronic lower back pain    Community acquired bacterial pneumonia 07/20/2018   Complex regional pain syndrome of left lower extremity 01/13/2012   Constipation 07/20/2018   Last Assessment & Plan:  Formatting of this note might be different from the original. -continue senokot and miralax -added suppository and sorbitol today  -order KUB if develops abd pain/n/v   COPD (chronic obstructive pulmonary disease) (HCC)    usesO2 24/7   Depression    DNR (do not resuscitate)    Dysuria-frequency syndrome    GERD (gastroesophageal reflux disease)    Headache(784.0)    intermittent migraines   History of CVA (cerebrovascular accident) 07/18/2018   Last Assessment & Plan:  Formatting of this note might be different  from the original. -not on antiplatelet therapy currently - has allergy (urticaria/hives) to aspirin  -would consider starting plavix monotherapy for secondary stroke prevention after completing DVT prophylaxis  -continue statin   History of home oxygen therapy 07/18/2018   History of kidney stones    Left acetabular fracture (HCC) 07/18/2018   Last Assessment & Plan:  Formatting of this note might be different from the original. -ortho plans for non operative management -Management per ortho primary team: PT/OT, wound care, weight bearing status and pain control -PT/OT rec SNF vs progression to home with HHPT   Left upper quadrant pain 07/18/2018   Last Assessment & Plan:  Formatting of this note might be different from the original. Lipase neg 2/2 8th rib fracture   -lidocaine patch  -encouraged IS   Leukocytosis 07/18/2018   Last Assessment & Plan:  Formatting of this note is different from the original. Lab Results  Component Value Date   WBC 14.9 (H) 07/20/2018  CXR and UA normal   -likely reactive, may be developing PNA -will start doxycycline -repeat CBC tomorrow   Major depression 07/18/2018   Last Assessment & Plan:  Formatting of this note might be different from the original. QTc 438 11/19  -continue home lexapro, wellbutrin, duloxetine, seroquel, and trazodone  -continue diazepam prn  -counseled her on polypharmacy and asking her PCP to try to wean her off some  of these medications   Multiple thyroid nodules    Pneumonia    Recurrent upper respiratory infection (URI)    Rotator cuff tear, right    RSD lower limb    left lower leg   S/P AKA (above knee amputation) (HCC) 07/19/2015   Sleep apnea 2010   uses continous oxygen   Stroke (HCC) 1997    Past Surgical History:  Procedure Laterality Date   ABDOMINAL HYSTERECTOMY  2003   AMPUTATION  01/12/2012   Procedure: AMPUTATION ABOVE KNEE;  Surgeon: Nadara Mustard, MD;  Location: [facility] OR;  Service: Orthopedics;  Laterality: Left;  Left  Above Knee Amputation   APPENDECTOMY     FRACTURE SURGERY     Morphine Pump     Morphine Pump Removed  2006   ORIF ANKLE FRACTURE  1995   STUMP REVISION Left 07/19/2015   Procedure: Revision Left Above Knee Amputation;  Surgeon: Nadara Mustard, MD;  Location: [facility] OR;  Service: Orthopedics;  Laterality: Left;    Current Medications: Current Meds  Medication Sig   acetaZOLAMIDE (DIAMOX) 500 MG capsule Take 1 capsule (500 mg total) by mouth daily.   albuterol (PROVENTIL HFA;VENTOLIN HFA) 108 (90 BASE) MCG/ACT inhaler Inhale 2 puffs into the lungs every 6 (six) hours as needed for wheezing or shortness of breath. For shortness of breath   alendronate (FOSAMAX) 70 MG tablet Take 70 mg by mouth once a week.   ANORO ELLIPTA 62.5-25 MCG/INH AEPB Inhale 1 puff into the lungs daily.   apixaban (ELIQUIS) 5 MG TABS tablet TAKE 1 TABLET BY MOUTH TWICE A DAY   atorvastatin (LIPITOR) 40 MG tablet Take 2 tablets (80 mg total) by mouth daily at 6 PM.   atorvastatin (LIPITOR) 80 MG tablet Take 80 mg by mouth daily.   cyclobenzaprine (FLEXERIL) 5 MG tablet Take 5 mg by mouth 2 (two) times daily as needed for spasms.   diltiazem (CARDIZEM CD) 120 MG 24 hr capsule Take 1 capsule (120 mg total) by mouth daily.   dronedarone (MULTAQ) 400 MG tablet Take 1 tablet (400 mg total) by mouth 2 (two) times daily with a meal.   DULoxetine (CYMBALTA) 60 MG capsule Take 60 mg by mouth daily.   FLUoxetine (PROZAC) 20 MG capsule Take 20 mg by mouth every morning.   fluticasone (FLONASE) 50 MCG/ACT nasal spray Place 2 sprays into both nostrils daily.   gabapentin (NEURONTIN) 300 MG capsule Take 2 capsules (600 mg total) by mouth 2 (two) times daily.   gabapentin (NEURONTIN) 300 MG capsule Take 1 capsule (300 mg total) by mouth 3 (three) times daily. 3 times a day when necessary neuropathy pain   gabapentin (NEURONTIN) 400 MG capsule Take 400 mg by mouth 3 (three) times daily.   hydrochlorothiazide (HYDRODIURIL) 25 MG tablet  Take 0.5 tablets (12.5 mg total) by mouth daily.   loratadine (CLARITIN) 10 MG tablet Take 10 mg by mouth daily.   MYRBETRIQ 50 MG TB24 tablet Take 50 mg by mouth daily.   naproxen (NAPROSYN) 500 MG tablet Take 1 tablet by mouth every 12 (twelve) hours as needed for pain.   omeprazole (PRILOSEC) 40 MG capsule Take 40 mg by mouth daily.   oxyCODONE-acetaminophen (ROXICET) 5-325 MG tablet Take 1 tablet by mouth every 8 (eight) hours as needed for severe pain.   predniSONE (DELTASONE) 20 MG tablet Take 2 tablets (40 mg total) by mouth daily with breakfast.   QUEtiapine (SEROQUEL) 25 MG tablet Take 25 mg by  mouth 2 (two) times daily.   traZODone (DESYREL) 100 MG tablet Take 100 mg by mouth at bedtime.   TRELEGY ELLIPTA 100-62.5-25 MCG/INH AEPB Inhale 1 puff into the lungs daily.     Allergies:   Aspirin, Bactrim [sulfamethoxazole-trimethoprim], Nitroglycerin, Oxycodone-aspirin, Percodan [oxycodone-aspirin], Sulfamethoxazole-trimethoprim, and Tape   Social History   Socioeconomic History   Marital status: Legally Separated    Spouse name: Not on file   Number of children: Not on file   Years of education: Not on file   Highest education level: Not on file  Occupational History   Occupation: disabled  Tobacco Use   Smoking status: Former    Packs/day: 2.00    Years: 35.00    Total pack years: 70.00    Types: Cigarettes    Quit date: 09/27/2008    Years since quitting: 13.5   Smokeless tobacco: Never  Substance and Sexual Activity   Alcohol use: No   Drug use: No   Sexual activity: Not Currently    Birth control/protection: Surgical  Other Topics Concern   Not on file  Social History Narrative   Not on file   Social Determinants of Health   Financial Resource Strain: Not on file  Food Insecurity: Not on file  Transportation Needs: Not on file  Physical Activity: Not on file  Stress: Not on file  Social Connections: Not on file     Family History: The patient's family  history is negative for Anesthesia problems. ROS:   Please see the history of present illness.    All 14 point review of systems negative except as described per history of present illness  EKGs/Labs/Other Studies Reviewed:      Recent Labs: 01/18/2022: Hemoglobin 11.3; Platelets 269  Recent Lipid Panel No results found for: "CHOL", "TRIG", "HDL", "CHOLHDL", "VLDL", "LDLCALC", "LDLDIRECT"  Physical Exam:    VS:  BP 110/72 (BP Location: Right Arm, Patient Position: Sitting)   Pulse 82   Ht 5' 5.5" (1.664 m)   Wt 183 lb (83 kg)   SpO2 97%   BMI 29.99 kg/m     Wt Readings from Last 3 Encounters:  04/20/22 183 lb (83 kg)  12/24/20 160 lb 15 oz (73 kg)  07/19/15 162 lb (73.5 kg)     GEN:  Well nourished, well developed in no acute distress HEENT: Normal NECK: No JVD; No carotid bruits LYMPHATICS: No lymphadenopathy CARDIAC: RRR, no murmurs, no rubs, no gallops RESPIRATORY:  Clear to auscultation without rales, wheezing or rhonchi  ABDOMEN: Soft, non-tender, non-distended MUSCULOSKELETAL:  No edema; No deformity  SKIN: Warm and dry LOWER EXTREMITIES: no swelling NEUROLOGIC:  Alert and oriented x 3 PSYCHIATRIC:  Normal affect   ASSESSMENT:    1. Chronic heart failure with preserved ejection fraction (HCC)   2. Paroxysmal atrial fibrillation (HCC)   3. Simple chronic bronchitis (HCC)   4. History of CVA (cerebrovascular accident)    PLAN:    In order of problems listed above:  Chronic heart failure with preserved left ventricle ejection fraction.  She is compensated on the physical exam we will continue present management Paroxysmal atrial fibrillation: She is maintaining sinus rhythm, will continue anticoagulation, she is on Multaq which I will continue COPD, she is oxygen dependent.  She was a smoker but does not smoke anymore.  That being followed by internal medicine team History of CVA.  No new problems continue present management Overall she is doing well I did  review visits from the emergency  room yesterday   Medication Adjustments/Labs and Tests Ordered: Current medicines are reviewed at length with the patient today.  Concerns regarding medicines are outlined above.  No orders of the defined types were placed in this encounter.  Medication changes: No orders of the defined types were placed in this encounter.   Signed, Georgeanna Lea, MD, [facility] 04/20/2022 3:14 PM     [facility]

## 2022-04-22 DIAGNOSIS — G4733 Obstructive sleep apnea (adult) (pediatric): Secondary | ICD-10-CM | POA: Diagnosis not present

## 2022-04-22 DIAGNOSIS — J449 Chronic obstructive pulmonary disease, unspecified: Secondary | ICD-10-CM | POA: Diagnosis not present

## 2022-04-22 DIAGNOSIS — R911 Solitary pulmonary nodule: Secondary | ICD-10-CM | POA: Diagnosis not present

## 2022-04-22 DIAGNOSIS — R5383 Other fatigue: Secondary | ICD-10-CM | POA: Diagnosis not present

## 2022-04-22 DIAGNOSIS — J9611 Chronic respiratory failure with hypoxia: Secondary | ICD-10-CM | POA: Diagnosis not present

## 2022-04-22 DIAGNOSIS — J301 Allergic rhinitis due to pollen: Secondary | ICD-10-CM | POA: Diagnosis not present

## 2022-04-25 ENCOUNTER — Other Ambulatory Visit: Payer: Self-pay | Admitting: Cardiology

## 2022-04-26 ENCOUNTER — Telehealth: Payer: Self-pay

## 2022-04-26 DIAGNOSIS — G4700 Insomnia, unspecified: Secondary | ICD-10-CM | POA: Diagnosis not present

## 2022-04-26 DIAGNOSIS — J449 Chronic obstructive pulmonary disease, unspecified: Secondary | ICD-10-CM | POA: Diagnosis not present

## 2022-04-26 DIAGNOSIS — I4891 Unspecified atrial fibrillation: Secondary | ICD-10-CM | POA: Diagnosis not present

## 2022-04-26 NOTE — Telephone Encounter (Signed)
        Patient  visit on 8/21  at [facility] ED  Have you been able to follow up with your primary care physician? YES  The patient was or was not able to obtain any needed medicine or equipment. YES  Are there diet recommendations that you are having difficulty following?NA  Patient expresses understanding of discharge instructions and education provided has no other needs at this time. YES     Lenard Forth Care Guide, Embedded Care Coordination [facility], Care Management  [phone] [address] [place], [place], [place] [address] Phone: [phone] Email: [email or web address]

## 2022-04-26 NOTE — Telephone Encounter (Signed)
Rx refill sent to pharmacy. 

## 2022-04-27 ENCOUNTER — Telehealth: Payer: Self-pay | Admitting: Cardiology

## 2022-04-27 NOTE — Telephone Encounter (Signed)
Pt c/o medication issue:  1. Name of Medication: MULTAQ 400 MG tablet QUEtiapine (SEROQUEL) 25 MG tablet 2. How are you currently taking this medication (dosage and times per day)?   3. Are you having a reaction (difficulty breathing--STAT)?   4. What is your medication issue? Pharmacy called wanting to know if its okay for pt to take multaq because she saw a drug interaction with quetiapine. She also said it interacts with stage 4 heart failure

## 2022-04-29 NOTE — Telephone Encounter (Signed)
Called pt with Dr. Vanetta Shawl reply to message. Pt agreed and will speak with her provider about changing Seroquel.

## 2022-04-30 DIAGNOSIS — J449 Chronic obstructive pulmonary disease, unspecified: Secondary | ICD-10-CM | POA: Diagnosis not present

## 2022-05-05 DIAGNOSIS — M2550 Pain in unspecified joint: Secondary | ICD-10-CM | POA: Diagnosis not present

## 2022-05-05 DIAGNOSIS — I4891 Unspecified atrial fibrillation: Secondary | ICD-10-CM | POA: Diagnosis not present

## 2022-05-05 DIAGNOSIS — R739 Hyperglycemia, unspecified: Secondary | ICD-10-CM | POA: Diagnosis not present

## 2022-05-05 DIAGNOSIS — J449 Chronic obstructive pulmonary disease, unspecified: Secondary | ICD-10-CM | POA: Diagnosis not present

## 2022-05-06 DIAGNOSIS — R5383 Other fatigue: Secondary | ICD-10-CM | POA: Diagnosis not present

## 2022-05-06 DIAGNOSIS — R911 Solitary pulmonary nodule: Secondary | ICD-10-CM | POA: Diagnosis not present

## 2022-05-06 DIAGNOSIS — J9611 Chronic respiratory failure with hypoxia: Secondary | ICD-10-CM | POA: Diagnosis not present

## 2022-05-06 DIAGNOSIS — J301 Allergic rhinitis due to pollen: Secondary | ICD-10-CM | POA: Diagnosis not present

## 2022-05-06 DIAGNOSIS — J441 Chronic obstructive pulmonary disease with (acute) exacerbation: Secondary | ICD-10-CM | POA: Diagnosis not present

## 2022-05-06 DIAGNOSIS — G4733 Obstructive sleep apnea (adult) (pediatric): Secondary | ICD-10-CM | POA: Diagnosis not present

## 2022-05-12 ENCOUNTER — Other Ambulatory Visit: Payer: Self-pay | Admitting: *Deleted

## 2022-05-13 MED ORDER — MULTAQ 400 MG PO TABS: ORAL_TABLET | ORAL | 2 refills | Status: DC

## 2022-05-14 ENCOUNTER — Telehealth: Payer: Self-pay | Admitting: Cardiology

## 2022-05-14 DIAGNOSIS — Z743 Need for continuous supervision: Secondary | ICD-10-CM | POA: Diagnosis not present

## 2022-05-14 DIAGNOSIS — Z20822 Contact with and (suspected) exposure to covid-19: Secondary | ICD-10-CM | POA: Diagnosis not present

## 2022-05-14 DIAGNOSIS — R0602 Shortness of breath: Secondary | ICD-10-CM | POA: Diagnosis not present

## 2022-05-14 DIAGNOSIS — R079 Chest pain, unspecified: Secondary | ICD-10-CM | POA: Diagnosis not present

## 2022-05-14 NOTE — Telephone Encounter (Signed)
Pt c/o Shortness Of Breath: STAT if SOB developed within the last 24 hours or pt is noticeably SOB on the phone  1. Are you currently SOB (can you hear that pt is SOB on the phone)?  A little  2. How long have you been experiencing SOB?  About a week but this morning really bad  3. Are you SOB when sitting or when up moving around?   When sitting and when moving around  4. Are you currently experiencing any other symptoms? Nausea  Patient called stating she is having a burning sensation from the inside out.  Patient stated she thinks it is being caused by one of her medications.

## 2022-05-14 NOTE — Telephone Encounter (Signed)
Advised to see her PCP as she states the burning sensation is throughout her body. Pt states she feels it is medication related but is not sure which medication, Last med change in our office was 3 months ago. Pt verbalized understanding and had no additional questions.

## 2022-05-19 DIAGNOSIS — G629 Polyneuropathy, unspecified: Secondary | ICD-10-CM | POA: Diagnosis not present

## 2022-05-19 DIAGNOSIS — R110 Nausea: Secondary | ICD-10-CM | POA: Diagnosis not present

## 2022-05-30 DIAGNOSIS — J449 Chronic obstructive pulmonary disease, unspecified: Secondary | ICD-10-CM | POA: Diagnosis not present

## 2022-06-22 DIAGNOSIS — Z79899 Other long term (current) drug therapy: Secondary | ICD-10-CM | POA: Diagnosis not present

## 2022-06-22 DIAGNOSIS — M2550 Pain in unspecified joint: Secondary | ICD-10-CM | POA: Diagnosis not present

## 2022-06-22 DIAGNOSIS — E785 Hyperlipidemia, unspecified: Secondary | ICD-10-CM | POA: Diagnosis not present

## 2022-06-22 DIAGNOSIS — Z23 Encounter for immunization: Secondary | ICD-10-CM | POA: Diagnosis not present

## 2022-06-30 DIAGNOSIS — J449 Chronic obstructive pulmonary disease, unspecified: Secondary | ICD-10-CM | POA: Diagnosis not present

## 2022-07-30 DIAGNOSIS — J449 Chronic obstructive pulmonary disease, unspecified: Secondary | ICD-10-CM | POA: Diagnosis not present

## 2022-08-04 DIAGNOSIS — Z79899 Other long term (current) drug therapy: Secondary | ICD-10-CM | POA: Diagnosis not present

## 2022-08-04 DIAGNOSIS — E785 Hyperlipidemia, unspecified: Secondary | ICD-10-CM | POA: Diagnosis not present

## 2022-08-04 DIAGNOSIS — G629 Polyneuropathy, unspecified: Secondary | ICD-10-CM | POA: Diagnosis not present

## 2022-08-04 DIAGNOSIS — R739 Hyperglycemia, unspecified: Secondary | ICD-10-CM | POA: Diagnosis not present

## 2022-08-05 DIAGNOSIS — G4733 Obstructive sleep apnea (adult) (pediatric): Secondary | ICD-10-CM | POA: Diagnosis not present

## 2022-08-05 DIAGNOSIS — J9611 Chronic respiratory failure with hypoxia: Secondary | ICD-10-CM | POA: Diagnosis not present

## 2022-08-05 DIAGNOSIS — R911 Solitary pulmonary nodule: Secondary | ICD-10-CM | POA: Diagnosis not present

## 2022-08-05 DIAGNOSIS — J441 Chronic obstructive pulmonary disease with (acute) exacerbation: Secondary | ICD-10-CM | POA: Diagnosis not present

## 2022-08-05 DIAGNOSIS — R5383 Other fatigue: Secondary | ICD-10-CM | POA: Diagnosis not present

## 2022-08-05 DIAGNOSIS — J301 Allergic rhinitis due to pollen: Secondary | ICD-10-CM | POA: Diagnosis not present

## 2022-08-30 DIAGNOSIS — J449 Chronic obstructive pulmonary disease, unspecified: Secondary | ICD-10-CM | POA: Diagnosis not present

## 2022-09-02 IMAGING — US US RENAL
1 series · 14 of 25 positions shown · non-contrast
Comparison: None.

CLINICAL DATA: Bilateral flank pain

EXAM:
RENAL / URINARY TRACT ULTRASOUND COMPLETE

[Series 1: us renal · 14 of 41 slices shown]
[im 1/41]
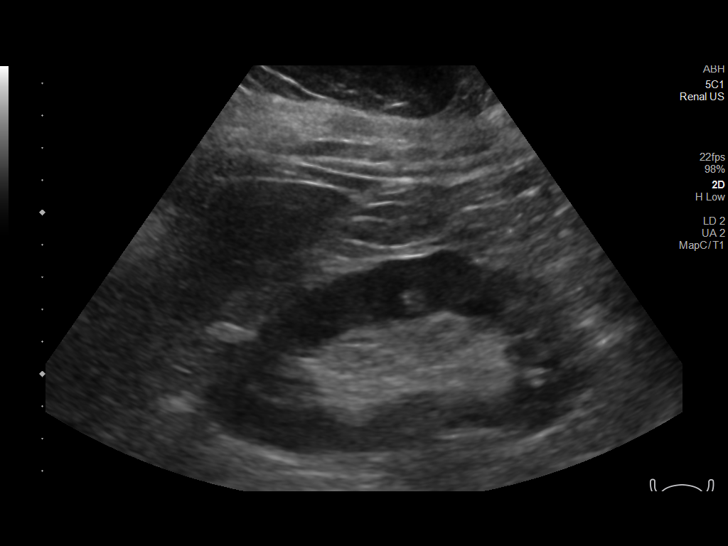
[im 4/41]
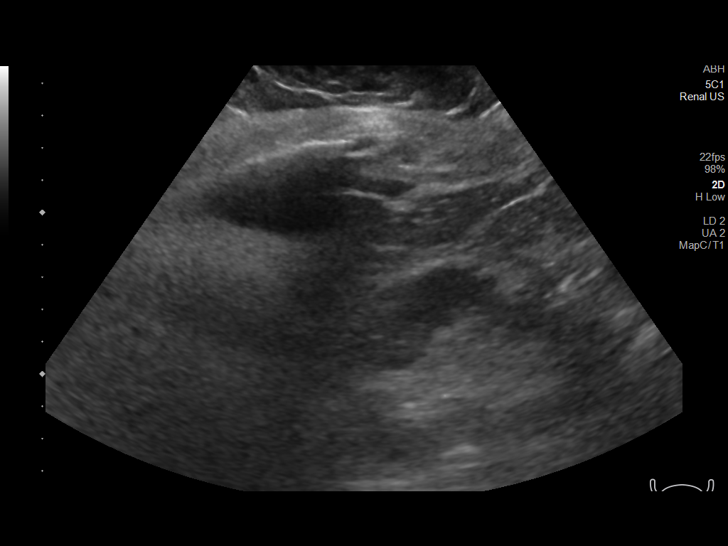
[im 7/41]
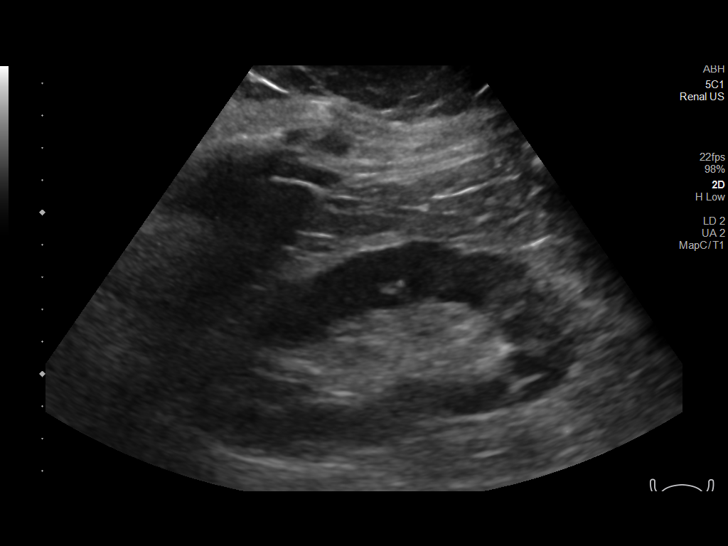
[im 11/41]
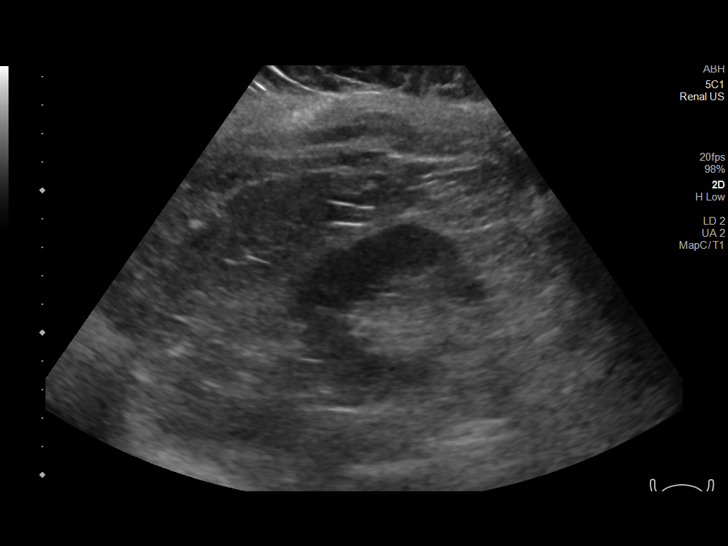
[im 14/41]
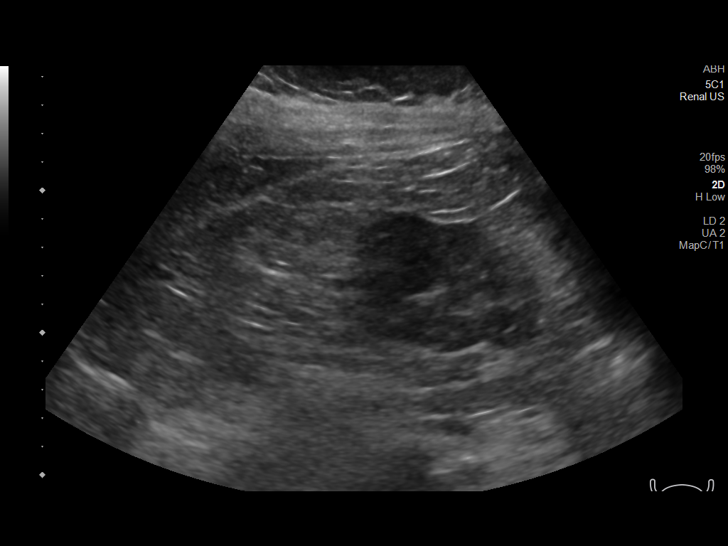
[im 16/41]
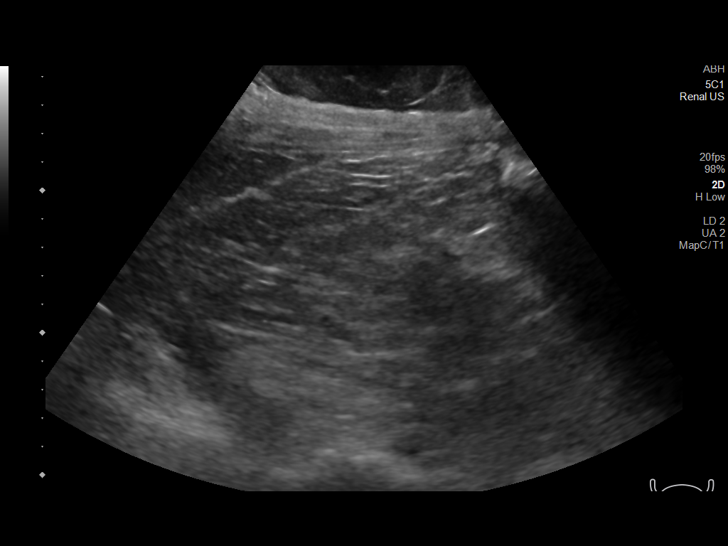
[im 19/41]
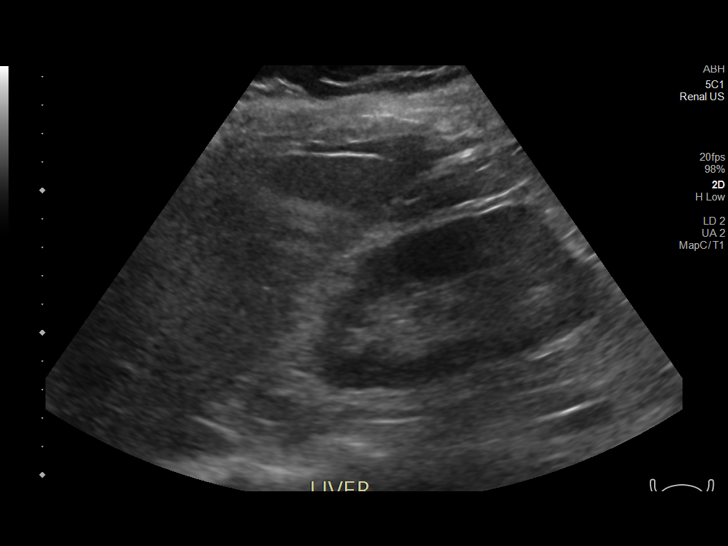
[im 22/41]
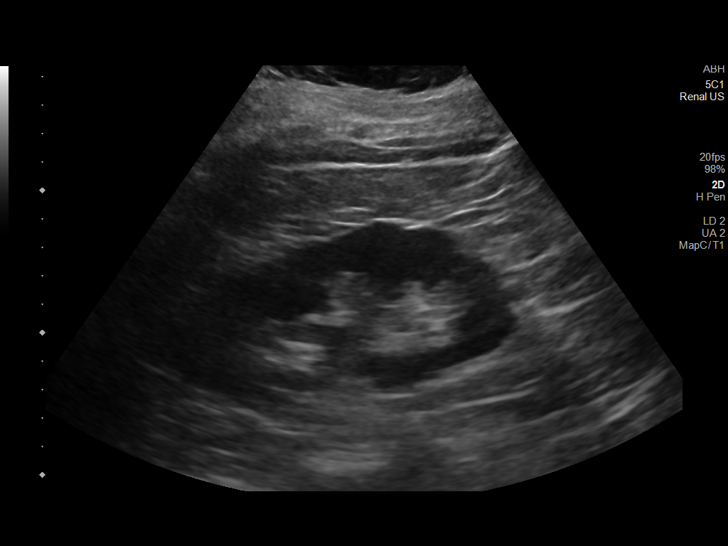
[im 26/41]
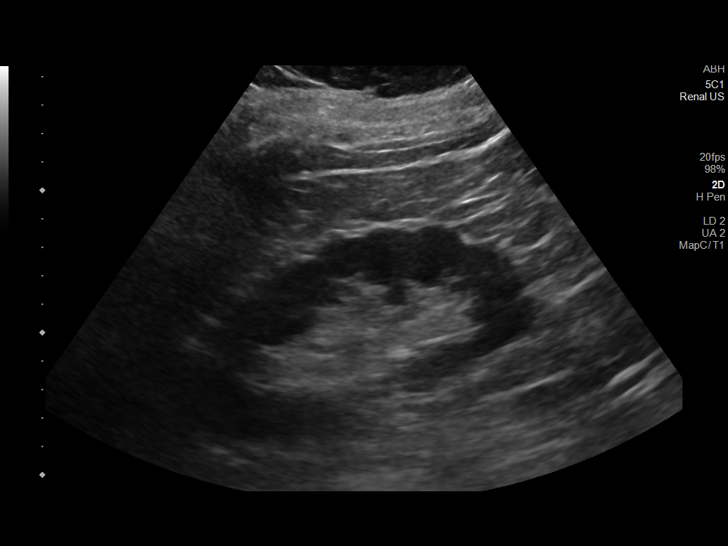
[im 27/41]
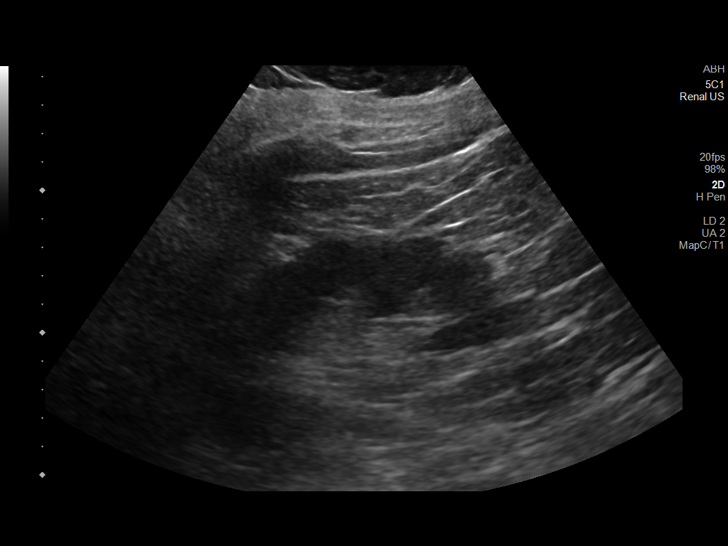
[im 31/41]
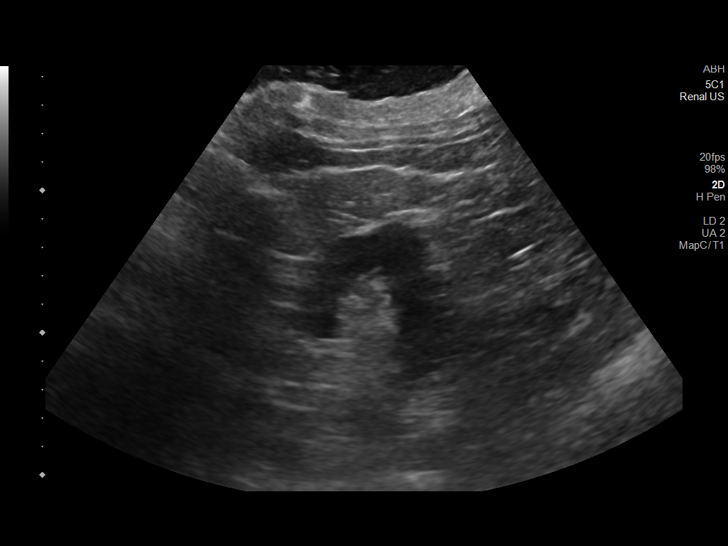
[im 34/41]
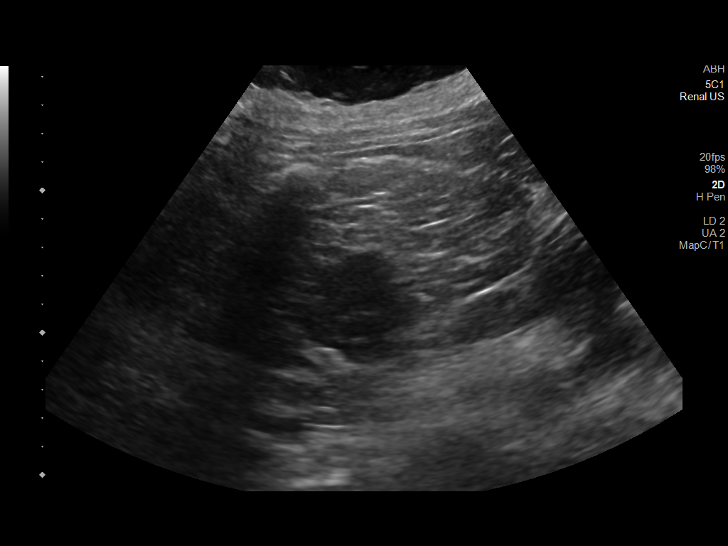
[im 37/41]
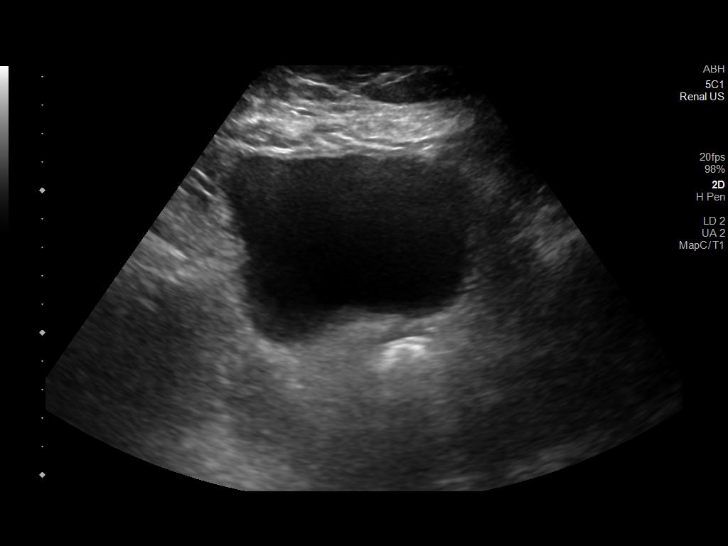
[im 41/41]
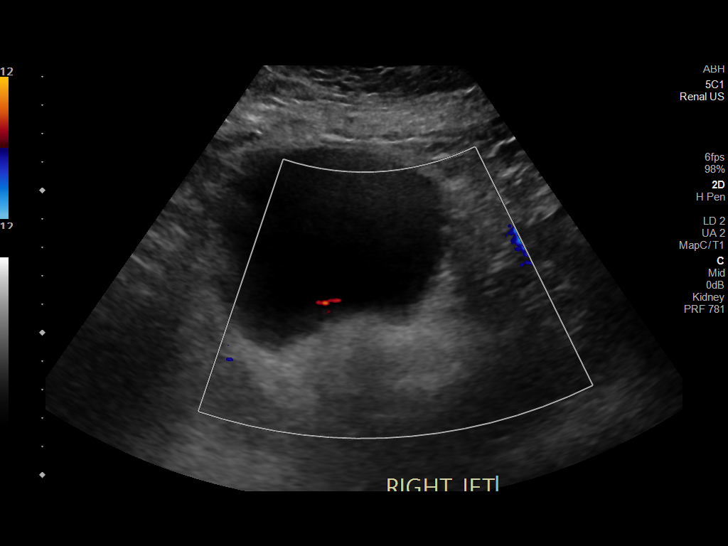

[14 of 25 positions shown; findings below may reference images not displayed]

FINDINGS: Right Kidney:

Renal measurements: 12.5 x 5.6 x 5.9 cm = volume: 212.8 mL.
Echogenicity within normal limits. No mass or hydronephrosis
visualized.

Left Kidney:

Renal measurements: 12.6 x 5.4 x 4.7 cm = volume: 167.2 mL.
Echogenicity within normal limits. No mass or hydronephrosis
visualized.

Bladder:

Appears normal for degree of bladder distention.

Other:

None.
IMPRESSION: Unremarkable renal ultrasound.

## 2022-09-02 IMAGING — CT CT ANGIO CHEST
3 of 7 series · 18 of 36 positions shown · IV contrast (omnipaque)
Comparison: 06/04/2020

CLINICAL DATA: Chest pain, dyspnea

EXAM:
CT ANGIOGRAPHY CHEST WITH CONTRAST
TECHNIQUE: Multidetector CT imaging of the chest was performed using the
standard protocol during bolus administration of intravenous
contrast. Multiplanar CT image reconstructions and MIPs were
obtained to evaluate the vascular anatomy.
CONTRAST:  75mL OMNIPAQUE IOHEXOL 350 MG/ML SOLN

[Series 6: pe thins · axial · 0.95mm/px · z∈[-244,-15]mm · 15 of 375 slices shown]
[im 24/375  lung]
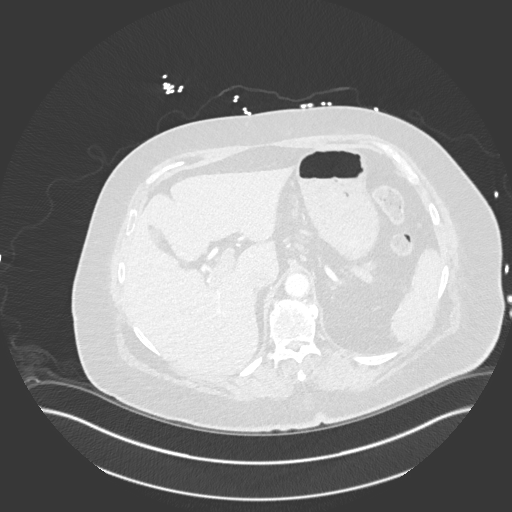
[im 47/375  mediastinal]
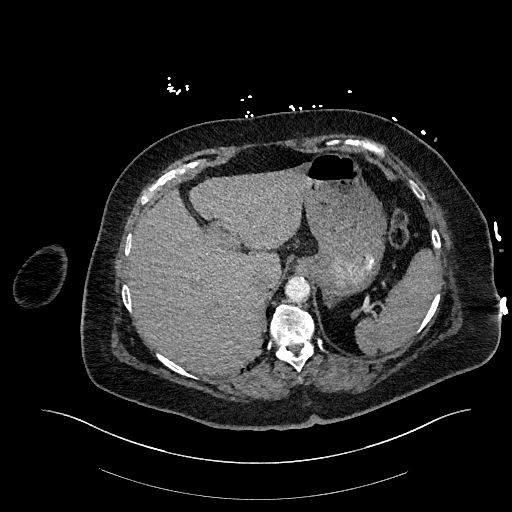
[im 71/375  lung]
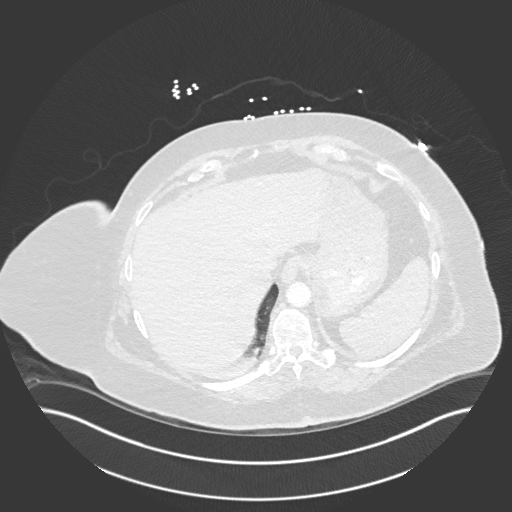
[im 94/375  mediastinal]
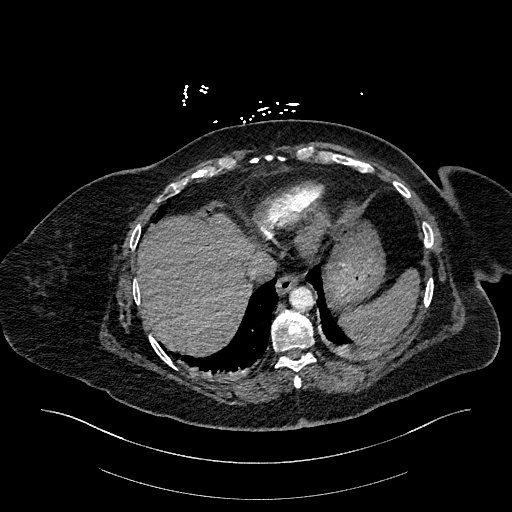
[im 117/375  lung]
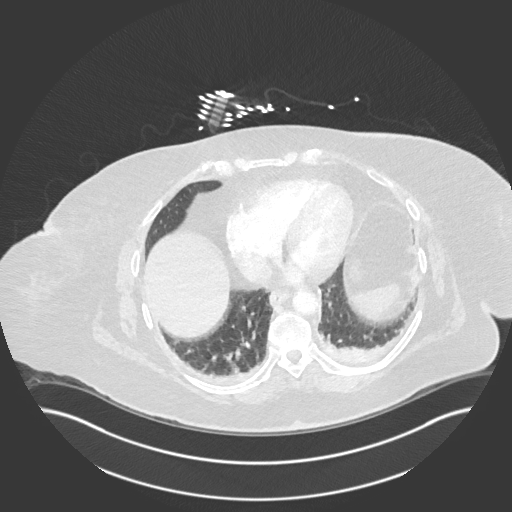
[im 141/375  mediastinal]
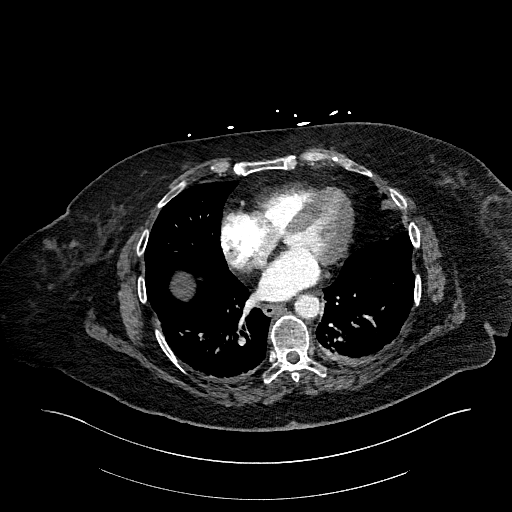
[im 164/375  lung]
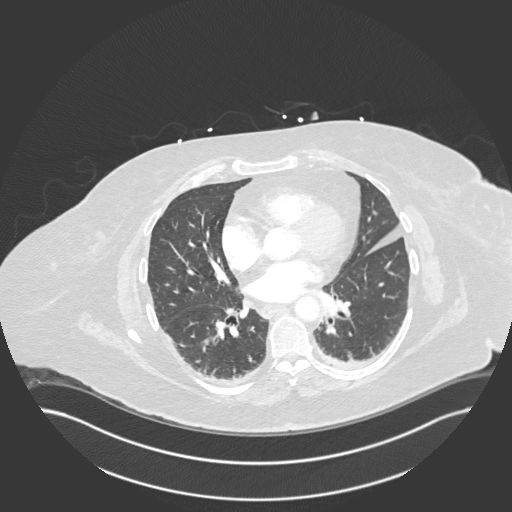
[im 188/375  mediastinal]
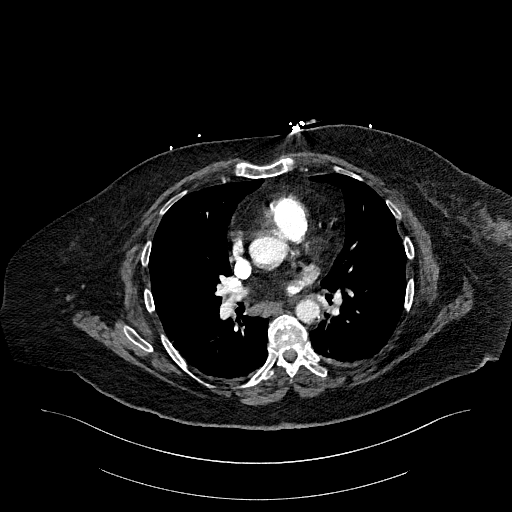
[im 211/375  lung]
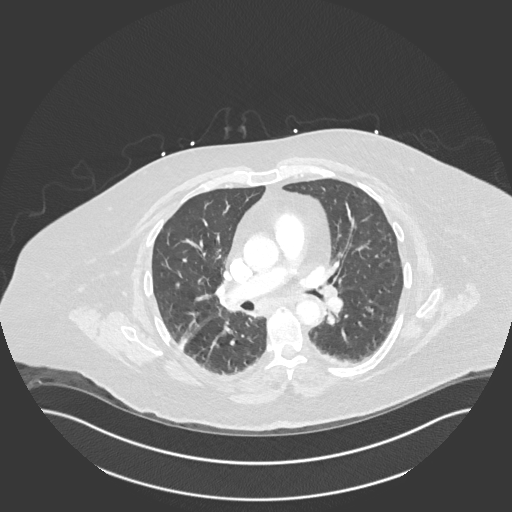
[im 234/375  mediastinal]
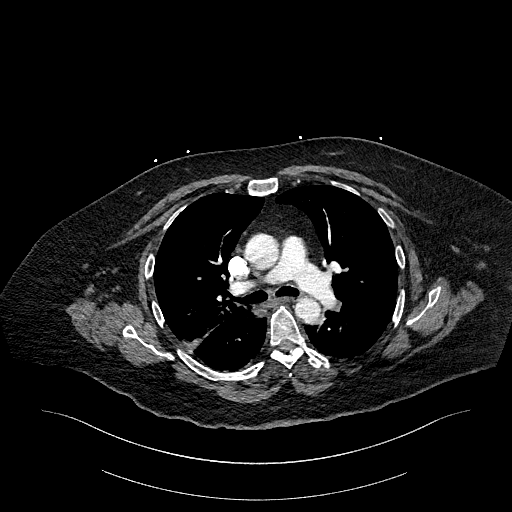
[im 258/375  lung]
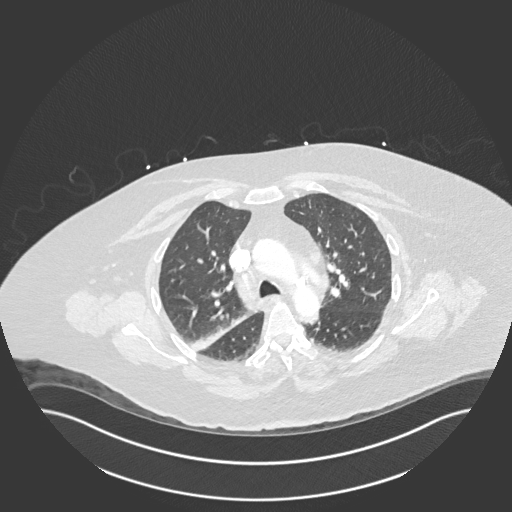
[im 281/375  mediastinal]
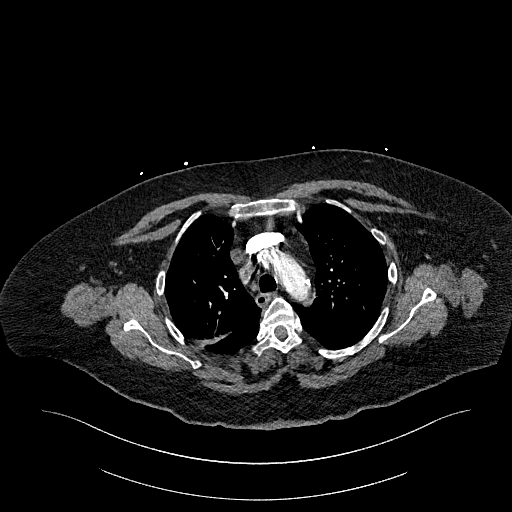
[im 304/375  lung]
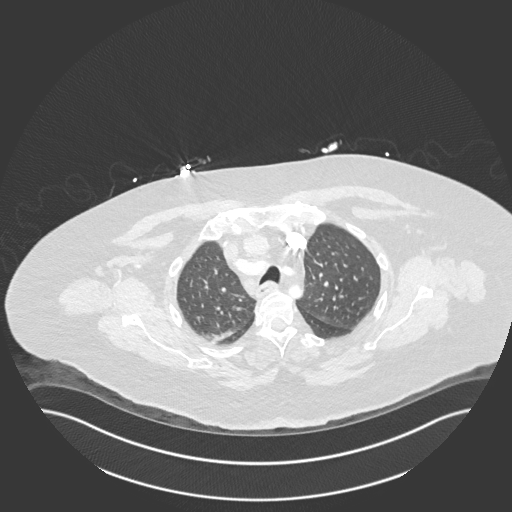
[im 328/375  mediastinal]
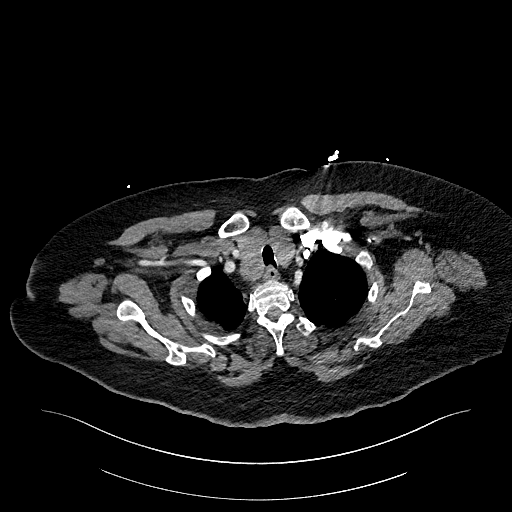
[im 351/375  lung]
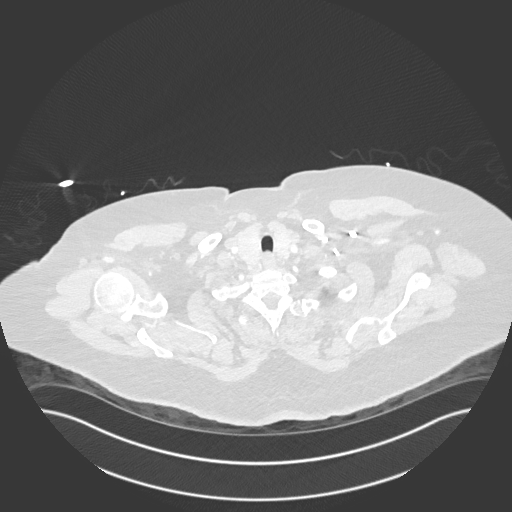

[Series 7: pe 2mm cor · coronal · 0.53mm/px · 1 of 151 slices shown]
[im 76/151  mediastinal]
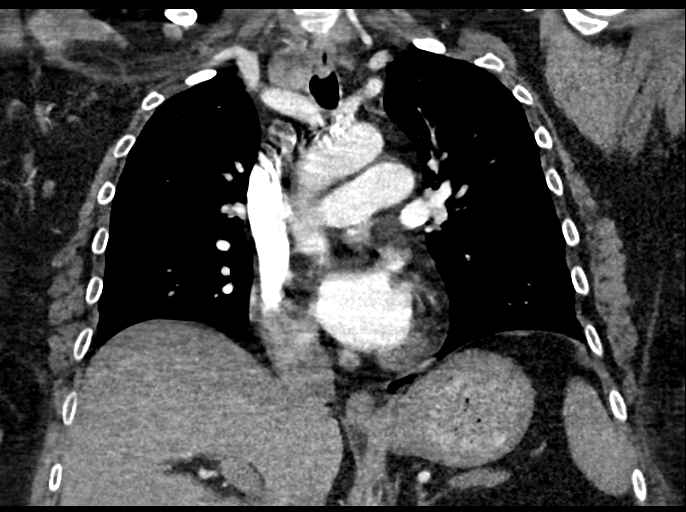

[Series 11: pe lung · axial · 0.95mm/px · z∈[-178,-118]mm · 2 of 120 slices shown]
[im 30/120  mediastinal]
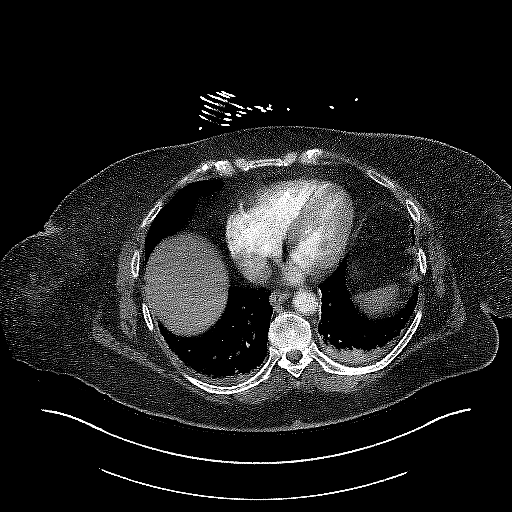
[im 60/120  mediastinal]
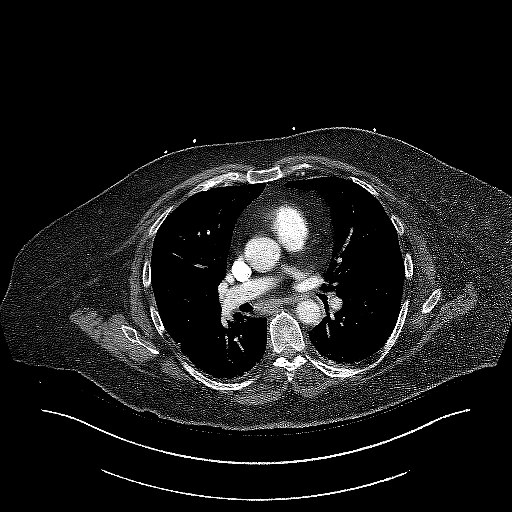

[18 of 36 positions shown; findings below may reference images not displayed]

FINDINGS: Cardiovascular: There is adequate opacification of the pulmonary
arterial tree. No intraluminal filling defect identified to suggest
acute pulmonary embolism. The central pulmonary arteries are of
normal caliber. No significant coronary artery calcification. Global
cardiac size within normal limits. No pericardial effusion. Mild
atherosclerotic calcification within the thoracic aorta. No aortic
aneurysm.

Mediastinum/Nodes: Stable 2.4 cm nodule within the right thyroid
lobe, better assessed on prior sonogram of 05/31/2019. This has,
however, increased in size since that examination where it measured
1.8 cm in greatest dimension. No pathologic thoracic adenopathy. The
esophagus is unremarkable.

Lungs/Pleura: Trace bilateral pleural effusions. Bibasilar
atelectasis. No focal pulmonary nodules or infiltrates. No
pneumothorax. Central airways are widely patent.

Upper Abdomen: No acute abnormality.

Musculoskeletal: No acute bone abnormality. No lytic or blastic bone
lesion.

Review of the MIP images confirms the above findings.
IMPRESSION: No pulmonary embolism.  No acute thoracic pathology identified.

2.4 cm right thyroid nodule. This has enlarged since prior sonogram
of 05/31/2019. Correlation for history of prior biopsy or repeat
thyroid sonogram is recommended for further evaluation.

Trace bilateral pleural effusions with minimal bilateral dependent
atelectasis.

Aortic Atherosclerosis ([id number]-BRY.Y).

## 2022-09-18 ENCOUNTER — Other Ambulatory Visit: Payer: Self-pay | Admitting: Cardiology

## 2022-09-18 DIAGNOSIS — I480 Paroxysmal atrial fibrillation: Secondary | ICD-10-CM

## 2022-09-20 NOTE — Telephone Encounter (Signed)
Prescription refill request for Eliquis received. Indication: AF Last office visit: 04/20/22  Nelta Numbers MD Scr: 0.69 on 08/05/22 Age: 67 Weight: 83kg  Based on above findings Eliquis 5mg  twice daily is the appropriate dose.  Refill approved.

## 2022-09-30 DIAGNOSIS — J449 Chronic obstructive pulmonary disease, unspecified: Secondary | ICD-10-CM | POA: Diagnosis not present

## 2022-10-05 DIAGNOSIS — R739 Hyperglycemia, unspecified: Secondary | ICD-10-CM | POA: Diagnosis not present

## 2022-10-05 DIAGNOSIS — J9611 Chronic respiratory failure with hypoxia: Secondary | ICD-10-CM | POA: Diagnosis not present

## 2022-10-05 DIAGNOSIS — Z79899 Other long term (current) drug therapy: Secondary | ICD-10-CM | POA: Diagnosis not present

## 2022-10-05 DIAGNOSIS — J449 Chronic obstructive pulmonary disease, unspecified: Secondary | ICD-10-CM | POA: Diagnosis not present

## 2022-10-22 ENCOUNTER — Ambulatory Visit: Payer: 59 | Admitting: Cardiology

## 2022-10-29 DIAGNOSIS — J449 Chronic obstructive pulmonary disease, unspecified: Secondary | ICD-10-CM | POA: Diagnosis not present

## 2022-11-24 ENCOUNTER — Other Ambulatory Visit: Payer: Self-pay

## 2022-11-24 MED ORDER — DILTIAZEM HCL ER COATED BEADS 120 MG PO CP24: 120.0000 mg | ORAL_CAPSULE | Freq: Every day | ORAL | 1 refills | Status: DC

## 2022-12-20 ENCOUNTER — Telehealth: Payer: Self-pay | Admitting: Cardiology

## 2022-12-20 MED ORDER — DILTIAZEM HCL ER COATED BEADS 120 MG PO CP24: 120.0000 mg | ORAL_CAPSULE | Freq: Every day | ORAL | 1 refills | Status: DC

## 2022-12-20 NOTE — Telephone Encounter (Signed)
*  STAT* If patient is at the pharmacy, call can be transferred to refill team.   1. Which medications need to be refilled? (please list name of each medication and dose if known) diltiazem (CARDIZEM CD) 120 MG 24 hr capsule   2. Which pharmacy/location (including street and city if local pharmacy) is medication to be sent to? [id number] - [place], [place] - [address]   3. Do they need a 30 day or 90 day supply? 90   

## 2022-12-20 NOTE — Telephone Encounter (Signed)
RX sent

## 2022-12-29 DIAGNOSIS — J449 Chronic obstructive pulmonary disease, unspecified: Secondary | ICD-10-CM | POA: Diagnosis not present

## 2023-01-04 DIAGNOSIS — R300 Dysuria: Secondary | ICD-10-CM | POA: Diagnosis not present

## 2023-01-04 DIAGNOSIS — N390 Urinary tract infection, site not specified: Secondary | ICD-10-CM | POA: Diagnosis not present

## 2023-01-04 DIAGNOSIS — Z1231 Encounter for screening mammogram for malignant neoplasm of breast: Secondary | ICD-10-CM | POA: Diagnosis not present

## 2023-01-11 ENCOUNTER — Ambulatory Visit: Payer: 59 | Admitting: Cardiology

## 2023-01-27 DIAGNOSIS — R5383 Other fatigue: Secondary | ICD-10-CM | POA: Diagnosis not present

## 2023-01-27 DIAGNOSIS — M545 Low back pain, unspecified: Secondary | ICD-10-CM | POA: Diagnosis not present

## 2023-01-27 DIAGNOSIS — J449 Chronic obstructive pulmonary disease, unspecified: Secondary | ICD-10-CM | POA: Diagnosis not present

## 2023-01-27 DIAGNOSIS — J301 Allergic rhinitis due to pollen: Secondary | ICD-10-CM | POA: Diagnosis not present

## 2023-01-27 DIAGNOSIS — J9611 Chronic respiratory failure with hypoxia: Secondary | ICD-10-CM | POA: Diagnosis not present

## 2023-01-27 DIAGNOSIS — G4733 Obstructive sleep apnea (adult) (pediatric): Secondary | ICD-10-CM | POA: Diagnosis not present

## 2023-01-28 DIAGNOSIS — J449 Chronic obstructive pulmonary disease, unspecified: Secondary | ICD-10-CM | POA: Diagnosis not present

## 2023-01-29 DIAGNOSIS — J449 Chronic obstructive pulmonary disease, unspecified: Secondary | ICD-10-CM | POA: Diagnosis not present

## 2023-02-07 DIAGNOSIS — E041 Nontoxic single thyroid nodule: Secondary | ICD-10-CM | POA: Diagnosis not present

## 2023-02-07 DIAGNOSIS — J9611 Chronic respiratory failure with hypoxia: Secondary | ICD-10-CM | POA: Diagnosis not present

## 2023-02-07 DIAGNOSIS — D649 Anemia, unspecified: Secondary | ICD-10-CM | POA: Diagnosis not present

## 2023-02-07 DIAGNOSIS — Z79899 Other long term (current) drug therapy: Secondary | ICD-10-CM | POA: Diagnosis not present

## 2023-02-07 DIAGNOSIS — I4891 Unspecified atrial fibrillation: Secondary | ICD-10-CM | POA: Diagnosis not present

## 2023-02-11 DIAGNOSIS — H9201 Otalgia, right ear: Secondary | ICD-10-CM | POA: Diagnosis not present

## 2023-02-14 ENCOUNTER — Other Ambulatory Visit: Payer: Self-pay | Admitting: Cardiology

## 2023-02-16 ENCOUNTER — Telehealth: Payer: Self-pay | Admitting: Cardiology

## 2023-02-16 MED ORDER — DILTIAZEM HCL ER COATED BEADS 120 MG PO CP24: 120.0000 mg | ORAL_CAPSULE | Freq: Every day | ORAL | 0 refills | Status: DC

## 2023-02-16 NOTE — Telephone Encounter (Signed)
Calling to see if there is any medication interaction with Multaq. Please advise

## 2023-02-16 NOTE — Telephone Encounter (Signed)
*  STAT* If patient is at the pharmacy, call can be transferred to refill team.   1. Which medications need to be refilled? (please list name of each medication and dose if known)   diltiazem (CARDIZEM CD) 120 MG 24 hr capsule    2. Which pharmacy/location (including street and city if local pharmacy) is medication to be sent to? [id number] - [place], [place] - [address]    3. Do they need a 30 day or 90 day supply? 90 day

## 2023-02-17 DIAGNOSIS — R918 Other nonspecific abnormal finding of lung field: Secondary | ICD-10-CM | POA: Diagnosis not present

## 2023-02-17 DIAGNOSIS — J439 Emphysema, unspecified: Secondary | ICD-10-CM | POA: Diagnosis not present

## 2023-02-17 NOTE — Telephone Encounter (Signed)
Amanda Lewis is out of the office until Monday. Advised the office that the medication contraindications per PCP. Letter with findings sent via EPIC fax to [facility].

## 2023-02-17 NOTE — Telephone Encounter (Signed)
Patient has multiple contraindications with Multaq, however this is not a new start for her.  Concurrent use of DRONEDARONE and Fluoxetine may result in increased risk of QT interval prolongation.  Concurrent use of DRONEDARONE and Cyclobenzaprine may result in increased risk of QT interval prolongation.  Concurrent use of DRONEDARONE and Seroquel  may result in increased risk of QT interval prolongation.  Concurrent use of DRONEDARONE and Trazodone may result in increased risk of QT interval prolongation.  One major interaction: Concurrent use of DILTIAZEM and DRONEDARONE may result in increased dronedarone exposure, increased dronedarone effects on conduction, and increased diltiazem exposure.

## 2023-02-28 DIAGNOSIS — J449 Chronic obstructive pulmonary disease, unspecified: Secondary | ICD-10-CM | POA: Diagnosis not present

## 2023-03-22 ENCOUNTER — Ambulatory Visit: Payer: 59 | Attending: Cardiology | Admitting: Cardiology

## 2023-03-22 ENCOUNTER — Encounter: Payer: Self-pay | Admitting: Cardiology

## 2023-03-22 VITALS — BP 128/66 | HR 58 | Ht 65.5 in | Wt 198.0 lb

## 2023-03-22 DIAGNOSIS — Z89612 Acquired absence of left leg above knee: Secondary | ICD-10-CM | POA: Diagnosis not present

## 2023-03-22 DIAGNOSIS — Z8673 Personal history of transient ischemic attack (TIA), and cerebral infarction without residual deficits: Secondary | ICD-10-CM | POA: Diagnosis not present

## 2023-03-22 DIAGNOSIS — Z0000 Encounter for general adult medical examination without abnormal findings: Secondary | ICD-10-CM | POA: Diagnosis not present

## 2023-03-22 DIAGNOSIS — I480 Paroxysmal atrial fibrillation: Secondary | ICD-10-CM | POA: Diagnosis not present

## 2023-03-22 DIAGNOSIS — J410 Simple chronic bronchitis: Secondary | ICD-10-CM

## 2023-03-22 DIAGNOSIS — Z9181 History of falling: Secondary | ICD-10-CM | POA: Diagnosis not present

## 2023-03-22 DIAGNOSIS — I5032 Chronic diastolic (congestive) heart failure: Secondary | ICD-10-CM

## 2023-03-22 NOTE — Addendum Note (Signed)
Addended by: Baldo Ash D on: 03/22/2023 02:39 PM   Modules accepted: Orders

## 2023-03-22 NOTE — Patient Instructions (Signed)
Medication Instructions:  Your physician recommends that you continue on your current medications as directed. Please refer to the Current Medication list given to you today.  *If you need a refill on your cardiac medications before your next appointment, please call your pharmacy*   Lab Work: None Ordered If you have labs (blood work) drawn today and your tests are completely normal, you will receive your results only by: MyChart Message (if you have MyChart) OR A paper copy in the mail If you have any lab test that is abnormal or we need to change your treatment, we will call you to review the results.   Testing/Procedures: Your physician has requested that you have an echocardiogram. Echocardiography is a painless test that uses sound waves to create images of your heart. It provides your doctor with information about the size and shape of your heart and how well your heart's chambers and valves are working. This procedure takes approximately one hour. There are no restrictions for this procedure. Please do NOT wear cologne, perfume, aftershave, or lotions (deodorant is allowed). Please arrive 15 minutes prior to your appointment time.      Follow-Up: At [facility], you and your health needs are our priority.  As part of our continuing mission to provide you with exceptional heart care, we have created designated Provider Care Teams.  These Care Teams include your primary Cardiologist (physician) and Advanced Practice Providers (APPs -  Physician Assistants and Nurse Practitioners) who all work together to provide you with the care you need, when you need it.  We recommend signing up for the patient portal called "MyChart".  Sign up information is provided on this After Visit Summary.  MyChart is used to connect with patients for Virtual Visits (Telemedicine).  Patients are able to view lab/test results, encounter notes, upcoming appointments, etc.  Non-urgent messages can be sent to  your provider as well.   To learn more about what you can do with MyChart, go to [email or web address].    Your next appointment:   6 month(s)  The format for your next appointment:   In Person  Provider:   Robert Krasowski, MD    Other Instructions NA  

## 2023-03-22 NOTE — Progress Notes (Signed)
Cardiology Office Note:    Date:  03/22/2023   ID:  Amanda Lewis, DOB Aug 04, 1956, MRN [id number]  PCP:  Eunice Blase, PA-C  Cardiologist:  Gypsy Balsam, MD    Referring MD: Eunice Blase, PA-C   Chief Complaint  Patient presents with   Follow-up    History of Present Illness:    Amanda Lewis is a 67 y.o. 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  She was put on anticoagulation, still continue, her CHADS2 Vascor equals 5.  At the same time she was initiated with Multaq seems to be tolerating this medication quite well denies have any chest pain tightness squeezing pressure burning chest no dizziness no passing out.  She does complain however having more shortness of breath.  Past Medical History:  Diagnosis Date   Anemia    Anxiety    on treatment   Arthritis    arms   Asthma    exertional   Chronic lower back pain    Community acquired bacterial pneumonia 07/20/2018   Complex regional pain syndrome of left lower extremity 01/13/2012   Constipation 07/20/2018   Last Assessment & Plan:  Formatting of this note might be different from the original. -continue senokot and miralax -added suppository and sorbitol today  -order KUB if develops abd pain/n/v   COPD (chronic obstructive pulmonary disease) (HCC)    usesO2 24/7   Depression    DNR (do not resuscitate)    Dysuria-frequency syndrome    GERD (gastroesophageal reflux disease)    Headache(784.0)    intermittent migraines   History of CVA (cerebrovascular accident) 07/18/2018   Last Assessment & Plan:  Formatting of this note might be different from the original. -not on antiplatelet therapy currently - has allergy (urticaria/hives) to aspirin  -would consider starting plavix monotherapy for secondary stroke prevention after  completing DVT prophylaxis  -continue statin   History of home oxygen therapy 07/18/2018   History of kidney stones    Left acetabular fracture (HCC) 07/18/2018   Last Assessment & Plan:  Formatting of this note might be different from the original. -ortho plans for non operative management -Management per ortho primary team: PT/OT, wound care, weight bearing status and pain control -PT/OT rec SNF vs progression to home with HHPT   Left upper quadrant pain 07/18/2018   Last Assessment & Plan:  Formatting of this note might be different from the original. Lipase neg 2/2 8th rib fracture   -lidocaine patch  -encouraged IS   Leukocytosis 07/18/2018   Last Assessment & Plan:  Formatting of this note is different from the original. Lab Results  Component Value Date   WBC 14.9 (H) 07/20/2018  CXR and UA normal   -likely reactive, may be developing PNA -will start doxycycline -repeat CBC tomorrow   Major depression 07/18/2018   Last Assessment & Plan:  Formatting of this note might be different from the original. QTc 438 11/19  -continue home lexapro, wellbutrin, duloxetine, seroquel, and trazodone  -continue diazepam prn  -counseled her on polypharmacy and asking her PCP to try to wean her off some of these medications   Multiple thyroid nodules    Pneumonia    Recurrent upper respiratory infection (URI)    Rotator cuff tear, right    RSD lower limb    left  lower leg   S/P AKA (above knee amputation) (HCC) 07/19/2015   Sleep apnea 2010   uses continous oxygen   Stroke [facility]) 1997    Past Surgical History:  Procedure Laterality Date   ABDOMINAL HYSTERECTOMY  2003   AMPUTATION  01/12/2012   Procedure: AMPUTATION ABOVE KNEE;  Surgeon: Nadara Mustard, MD;  Location: [facility] OR;  Service: Orthopedics;  Laterality: Left;  Left Above Knee Amputation   APPENDECTOMY     FRACTURE SURGERY     Morphine Pump     Morphine Pump Removed  2006   ORIF ANKLE FRACTURE  1995   STUMP REVISION Left 07/19/2015    Procedure: Revision Left Above Knee Amputation;  Surgeon: Nadara Mustard, MD;  Location: [facility] OR;  Service: Orthopedics;  Laterality: Left;    Current Medications: Current Meds  Medication Sig   acetaZOLAMIDE (DIAMOX) 500 MG capsule Take 1 capsule (500 mg total) by mouth daily.   albuterol (PROVENTIL HFA;VENTOLIN HFA) 108 (90 BASE) MCG/ACT inhaler Inhale 2 puffs into the lungs every 6 (six) hours as needed for wheezing or shortness of breath. For shortness of breath   alendronate (FOSAMAX) 70 MG tablet Take 70 mg by mouth once a week.   ANORO ELLIPTA 62.5-25 MCG/INH AEPB Inhale 1 puff into the lungs daily.   atorvastatin (LIPITOR) 40 MG tablet Take 2 tablets (80 mg total) by mouth daily at 6 PM.   atorvastatin (LIPITOR) 80 MG tablet Take 80 mg by mouth daily.   cyclobenzaprine (FLEXERIL) 5 MG tablet Take 5 mg by mouth 2 (two) times daily as needed for spasms.   diltiazem (CARDIZEM CD) 120 MG 24 hr capsule Take 1 capsule (120 mg total) by mouth daily.   dronedarone (MULTAQ) 400 MG tablet Take 1 tablet (400 mg total) by mouth 2 (two) times daily with a meal.   ELIQUIS 5 MG TABS tablet TAKE 1 TABLET BY MOUTH TWICE A DAY   FLUoxetine (PROZAC) 20 MG capsule Take 20 mg by mouth every morning.   fluticasone (FLONASE) 50 MCG/ACT nasal spray Place 2 sprays into both nostrils daily.   gabapentin (NEURONTIN) 300 MG capsule Take 2 capsules (600 mg total) by mouth 2 (two) times daily.   gabapentin (NEURONTIN) 300 MG capsule Take 1 capsule (300 mg total) by mouth 3 (three) times daily. 3 times a day when necessary neuropathy pain   gabapentin (NEURONTIN) 400 MG capsule Take 400 mg by mouth 3 (three) times daily.   hydrochlorothiazide (HYDRODIURIL) 25 MG tablet Take 0.5 tablets (12.5 mg total) by mouth daily.   loratadine (CLARITIN) 10 MG tablet Take 10 mg by mouth daily.   MYRBETRIQ 50 MG TB24 tablet Take 50 mg by mouth daily.   omeprazole (PRILOSEC) 40 MG capsule Take 40 mg by mouth daily.    oxyCODONE-acetaminophen (ROXICET) 5-325 MG tablet Take 1 tablet by mouth every 8 (eight) hours as needed for severe pain.   traZODone (DESYREL) 100 MG tablet Take 100 mg by mouth at bedtime.   TRELEGY ELLIPTA 100-62.5-25 MCG/INH AEPB Inhale 1 puff into the lungs daily.   [DISCONTINUED] DULoxetine (CYMBALTA) 60 MG capsule Take 60 mg by mouth daily.   [DISCONTINUED] naproxen (NAPROSYN) 500 MG tablet Take 1 tablet by mouth every 12 (twelve) hours as needed for pain.   [DISCONTINUED] predniSONE (DELTASONE) 20 MG tablet Take 2 tablets (40 mg total) by mouth daily with breakfast.   [DISCONTINUED] QUEtiapine (SEROQUEL) 25 MG tablet Take 25 mg by mouth 2 (two) times daily.  Allergies:   Aspirin, Bactrim [sulfamethoxazole-trimethoprim], Nitroglycerin, Oxycodone-aspirin, Percodan [oxycodone-aspirin], Sulfamethoxazole-trimethoprim, and Tape   Social History   Socioeconomic History   Marital status: Legally Separated    Spouse name: Not on file   Number of children: Not on file   Years of education: Not on file   Highest education level: Not on file  Occupational History   Occupation: disabled  Tobacco Use   Smoking status: Former    Current packs/day: 0.00    Average packs/day: 2.0 packs/day for 35.0 years (70.0 ttl pk-yrs)    Types: Cigarettes    Start date: 09/27/1973    Quit date: 09/27/2008    Years since quitting: 14.4   Smokeless tobacco: Never  Substance and Sexual Activity   Alcohol use: No   Drug use: No   Sexual activity: Not Currently    Birth control/protection: Surgical  Other Topics Concern   Not on file  Social History Narrative   Not on file   Social Determinants of Health   Financial Resource Strain: Not on file  Food Insecurity: Not on file  Transportation Needs: Not on file  Physical Activity: Not on file  Stress: Not on file  Social Connections: Not on file     Family History: The patient's family history is negative for Anesthesia problems. ROS:    Please see the history of present illness.    All 14 point review of systems negative except as described per history of present illness  EKGs/Labs/Other Studies Reviewed:         Recent Labs: No results found for requested labs within last 365 days.  Recent Lipid Panel No results found for: "CHOL", "TRIG", "HDL", "CHOLHDL", "VLDL", "LDLCALC", "LDLDIRECT"  Physical Exam:    VS:  BP 128/66 (BP Location: Left Arm, Patient Position: Sitting)   Pulse (!) 58   Ht 5' 5.5" (1.664 m)   Wt 198 lb (89.8 kg)   SpO2 93%   BMI 32.45 kg/m     Wt Readings from Last 3 Encounters:  03/22/23 198 lb (89.8 kg)  04/20/22 183 lb (83 kg)  12/24/20 160 lb 15 oz (73 kg)     GEN:  Well nourished, well developed in no acute distress HEENT: Normal NECK: No JVD; No carotid bruits LYMPHATICS: No lymphadenopathy CARDIAC: RRR, no murmurs, no rubs, no gallops RESPIRATORY:  Clear to auscultation without rales, wheezing or rhonchi  ABDOMEN: Soft, non-tender, non-distended MUSCULOSKELETAL:  No edema; No deformity  SKIN: Warm and dry LOWER EXTREMITIES: no swelling NEUROLOGIC:  Alert and oriented x 3 PSYCHIATRIC:  Normal affect   ASSESSMENT:    1. Chronic heart failure with preserved ejection fraction (HCC)   2. Paroxysmal atrial fibrillation (HCC)   3. Simple chronic bronchitis (HCC)   4. History of CVA (cerebrovascular accident)   5. 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  However overall she takes only very small dose of Cardizem only 120 mg daily.  If EKG is fine we will be acceptable to continue. Chronic diastolic congestive heart failure.  Overall on the physical exam looks compensated but complain of having shortness of breath, she think it is related to her asthma, will do  echocardiogram to check left ventricle ejection fraction. COPD: Noted, she quit smoking abstain  from smoking. History of CVA stable. Dyslipidemia I did review her K PN which show me her LDL 67 HDL 58 good cholesterol profile we will continue present management   Medication Adjustments/Labs and Tests Ordered: Current medicines are reviewed at length with the patient today.  Concerns regarding medicines are outlined above.  Orders Placed This Encounter  Procedures   EKG 12-Lead   Medication changes: No orders of the defined types were placed in this encounter.   Signed, Georgeanna Lea, MD, [facility] 03/22/2023 2:28 PM    [facility]

## 2023-03-24 DIAGNOSIS — J449 Chronic obstructive pulmonary disease, unspecified: Secondary | ICD-10-CM | POA: Diagnosis not present

## 2023-03-24 DIAGNOSIS — G4733 Obstructive sleep apnea (adult) (pediatric): Secondary | ICD-10-CM | POA: Diagnosis not present

## 2023-03-24 DIAGNOSIS — J9611 Chronic respiratory failure with hypoxia: Secondary | ICD-10-CM | POA: Diagnosis not present

## 2023-03-24 DIAGNOSIS — J301 Allergic rhinitis due to pollen: Secondary | ICD-10-CM | POA: Diagnosis not present

## 2023-03-30 DIAGNOSIS — E041 Nontoxic single thyroid nodule: Secondary | ICD-10-CM | POA: Diagnosis not present

## 2023-03-30 DIAGNOSIS — E042 Nontoxic multinodular goiter: Secondary | ICD-10-CM | POA: Diagnosis not present

## 2023-03-31 ENCOUNTER — Other Ambulatory Visit: Payer: Self-pay | Admitting: Cardiology

## 2023-03-31 DIAGNOSIS — J449 Chronic obstructive pulmonary disease, unspecified: Secondary | ICD-10-CM | POA: Diagnosis not present

## 2023-03-31 DIAGNOSIS — I480 Paroxysmal atrial fibrillation: Secondary | ICD-10-CM

## 2023-03-31 NOTE — Telephone Encounter (Signed)
Prescription refill request for Eliquis received. Indication: PAF Last office visit: 03/22/23  Kandyce Rud MD Scr:0.67 on 12/10/22  [facility] Age: 67 Weight: 89.9kg  Based on above findings Eliquis 5mg  twice daily is the appropriate dose.  Refill approved.

## 2023-04-12 ENCOUNTER — Ambulatory Visit: Payer: 59

## 2023-04-15 DIAGNOSIS — I700 Atherosclerosis of aorta: Secondary | ICD-10-CM | POA: Diagnosis not present

## 2023-04-15 DIAGNOSIS — D509 Iron deficiency anemia, unspecified: Secondary | ICD-10-CM | POA: Diagnosis not present

## 2023-04-15 DIAGNOSIS — Z79899 Other long term (current) drug therapy: Secondary | ICD-10-CM | POA: Diagnosis not present

## 2023-04-15 DIAGNOSIS — R739 Hyperglycemia, unspecified: Secondary | ICD-10-CM | POA: Diagnosis not present

## 2023-04-15 DIAGNOSIS — E785 Hyperlipidemia, unspecified: Secondary | ICD-10-CM | POA: Diagnosis not present

## 2023-04-22 DIAGNOSIS — E041 Nontoxic single thyroid nodule: Secondary | ICD-10-CM | POA: Diagnosis not present

## 2023-04-28 ENCOUNTER — Telehealth: Payer: Self-pay | Admitting: Cardiology

## 2023-04-28 DIAGNOSIS — J9611 Chronic respiratory failure with hypoxia: Secondary | ICD-10-CM | POA: Diagnosis not present

## 2023-04-28 DIAGNOSIS — G4733 Obstructive sleep apnea (adult) (pediatric): Secondary | ICD-10-CM | POA: Diagnosis not present

## 2023-04-28 DIAGNOSIS — J301 Allergic rhinitis due to pollen: Secondary | ICD-10-CM | POA: Diagnosis not present

## 2023-04-28 DIAGNOSIS — J449 Chronic obstructive pulmonary disease, unspecified: Secondary | ICD-10-CM | POA: Diagnosis not present

## 2023-04-28 NOTE — Telephone Encounter (Signed)
Notified pt that Pharm D stated that it is ok for her to take Prednisone short term.

## 2023-04-28 NOTE — Telephone Encounter (Signed)
Pt c/o medication issue:  1. Name of Medication:  Prednisone  2. How are you currently taking this medication (dosage and times per day)?   Not started yet  3. Are you having a reaction (difficulty breathing--STAT)?   4. What is your medication issue?   Patient stated her regular doctor prescribed this medication and wants to know if this is OK for her to take

## 2023-04-28 NOTE — Telephone Encounter (Signed)
Ok to take short term.

## 2023-05-01 DIAGNOSIS — J449 Chronic obstructive pulmonary disease, unspecified: Secondary | ICD-10-CM | POA: Diagnosis not present

## 2023-05-10 ENCOUNTER — Ambulatory Visit: Payer: 59 | Attending: Cardiology

## 2023-05-10 DIAGNOSIS — I5032 Chronic diastolic (congestive) heart failure: Secondary | ICD-10-CM | POA: Diagnosis not present

## 2023-05-11 LAB — ECHOCARDIOGRAM COMPLETE
Area-P 1/2: 3.54 cm2
MV M vel: 3.69 m/s
MV Peak grad: 54.5 mmHg
S' Lateral: 3.1 cm

## 2023-05-13 ENCOUNTER — Telehealth: Payer: Self-pay

## 2023-05-13 NOTE — Telephone Encounter (Signed)
Patient notified of results and verbalized understanding.

## 2023-05-13 NOTE — Telephone Encounter (Signed)
-----   Message from Gypsy Balsam sent at 05/13/2023 12:07 PM EDT ----- Echocardiogram showed preserved left ventricle ejection fraction, mild mitral valve regurgitation

## 2023-05-31 DIAGNOSIS — J449 Chronic obstructive pulmonary disease, unspecified: Secondary | ICD-10-CM | POA: Diagnosis not present

## 2023-06-14 DIAGNOSIS — H5789 Other specified disorders of eye and adnexa: Secondary | ICD-10-CM | POA: Diagnosis not present

## 2023-06-14 DIAGNOSIS — I509 Heart failure, unspecified: Secondary | ICD-10-CM | POA: Diagnosis not present

## 2023-06-14 DIAGNOSIS — Z79899 Other long term (current) drug therapy: Secondary | ICD-10-CM | POA: Diagnosis not present

## 2023-06-14 DIAGNOSIS — J9611 Chronic respiratory failure with hypoxia: Secondary | ICD-10-CM | POA: Diagnosis not present

## 2023-06-14 DIAGNOSIS — Z23 Encounter for immunization: Secondary | ICD-10-CM | POA: Diagnosis not present

## 2023-06-14 DIAGNOSIS — I700 Atherosclerosis of aorta: Secondary | ICD-10-CM | POA: Diagnosis not present

## 2023-06-22 DIAGNOSIS — J301 Allergic rhinitis due to pollen: Secondary | ICD-10-CM | POA: Diagnosis not present

## 2023-06-22 DIAGNOSIS — G4733 Obstructive sleep apnea (adult) (pediatric): Secondary | ICD-10-CM | POA: Diagnosis not present

## 2023-06-22 DIAGNOSIS — J9611 Chronic respiratory failure with hypoxia: Secondary | ICD-10-CM | POA: Diagnosis not present

## 2023-06-22 DIAGNOSIS — J449 Chronic obstructive pulmonary disease, unspecified: Secondary | ICD-10-CM | POA: Diagnosis not present

## 2023-07-01 DIAGNOSIS — J449 Chronic obstructive pulmonary disease, unspecified: Secondary | ICD-10-CM | POA: Diagnosis not present

## 2023-07-31 DIAGNOSIS — J449 Chronic obstructive pulmonary disease, unspecified: Secondary | ICD-10-CM | POA: Diagnosis not present

## 2023-08-04 DIAGNOSIS — J301 Allergic rhinitis due to pollen: Secondary | ICD-10-CM | POA: Diagnosis not present

## 2023-08-04 DIAGNOSIS — G4733 Obstructive sleep apnea (adult) (pediatric): Secondary | ICD-10-CM | POA: Diagnosis not present

## 2023-08-04 DIAGNOSIS — J449 Chronic obstructive pulmonary disease, unspecified: Secondary | ICD-10-CM | POA: Diagnosis not present

## 2023-08-04 DIAGNOSIS — J9611 Chronic respiratory failure with hypoxia: Secondary | ICD-10-CM | POA: Diagnosis not present

## 2023-08-08 DIAGNOSIS — G4733 Obstructive sleep apnea (adult) (pediatric): Secondary | ICD-10-CM | POA: Diagnosis not present

## 2023-08-16 DIAGNOSIS — D72828 Other elevated white blood cell count: Secondary | ICD-10-CM | POA: Diagnosis not present

## 2023-08-16 DIAGNOSIS — D509 Iron deficiency anemia, unspecified: Secondary | ICD-10-CM | POA: Diagnosis not present

## 2023-08-29 ENCOUNTER — Other Ambulatory Visit: Payer: Self-pay | Admitting: Cardiology

## 2023-08-31 DIAGNOSIS — J449 Chronic obstructive pulmonary disease, unspecified: Secondary | ICD-10-CM | POA: Diagnosis not present

## 2023-09-08 DIAGNOSIS — G4733 Obstructive sleep apnea (adult) (pediatric): Secondary | ICD-10-CM | POA: Diagnosis not present

## 2023-09-12 DIAGNOSIS — J069 Acute upper respiratory infection, unspecified: Secondary | ICD-10-CM | POA: Diagnosis not present

## 2023-09-12 DIAGNOSIS — R509 Fever, unspecified: Secondary | ICD-10-CM | POA: Diagnosis not present

## 2023-09-12 DIAGNOSIS — R300 Dysuria: Secondary | ICD-10-CM | POA: Diagnosis not present

## 2023-09-30 DIAGNOSIS — R350 Frequency of micturition: Secondary | ICD-10-CM | POA: Diagnosis not present

## 2023-09-30 DIAGNOSIS — R300 Dysuria: Secondary | ICD-10-CM | POA: Diagnosis not present

## 2023-10-01 DIAGNOSIS — J449 Chronic obstructive pulmonary disease, unspecified: Secondary | ICD-10-CM | POA: Diagnosis not present

## 2023-10-07 ENCOUNTER — Telehealth: Payer: Self-pay | Admitting: Cardiology

## 2023-10-07 ENCOUNTER — Other Ambulatory Visit: Payer: Self-pay | Admitting: Cardiology

## 2023-10-07 DIAGNOSIS — I480 Paroxysmal atrial fibrillation: Secondary | ICD-10-CM

## 2023-10-07 MED ORDER — APIXABAN 5 MG PO TABS: 5.0000 mg | ORAL_TABLET | Freq: Two times a day (BID) | ORAL | 1 refills | Status: DC

## 2023-10-07 NOTE — Telephone Encounter (Signed)
 Prescription refill request for Eliquis  received. Indication: Afib  Last office visit:03/22/23 Gordan Latina)  Scr: 0.61 (04/15/23 via [facility])  Age: 68 Weight: 89.8kg  Appropriate dose. Refill sent.

## 2023-10-07 NOTE — Telephone Encounter (Signed)
*  STAT* If patient is at the pharmacy, call can be transferred to refill team.   1. Which medications need to be refilled? (please list name of each medication and dose if known)   ELIQUIS  5 MG TABS tablet   2. Would you like to learn more about the convenience, safety, & potential cost savings by using the [facility]?   3. Are you open to using the [facility] (Type [facility]. ).  4. Which pharmacy/location (including street and city if local pharmacy) is medication to be sent to?  [id number] - [place], [place] - [address]   5. Do they need a 30 day or 90 day supply?   90 day  Patient stated she is completely out of this medication.

## 2023-10-09 DIAGNOSIS — G4733 Obstructive sleep apnea (adult) (pediatric): Secondary | ICD-10-CM | POA: Diagnosis not present

## 2023-10-11 DIAGNOSIS — R627 Adult failure to thrive: Secondary | ICD-10-CM | POA: Diagnosis not present

## 2023-10-21 DIAGNOSIS — Z79899 Other long term (current) drug therapy: Secondary | ICD-10-CM | POA: Diagnosis not present

## 2023-10-21 DIAGNOSIS — E785 Hyperlipidemia, unspecified: Secondary | ICD-10-CM | POA: Diagnosis not present

## 2023-10-21 DIAGNOSIS — D509 Iron deficiency anemia, unspecified: Secondary | ICD-10-CM | POA: Diagnosis not present

## 2023-10-21 DIAGNOSIS — R739 Hyperglycemia, unspecified: Secondary | ICD-10-CM | POA: Diagnosis not present

## 2023-10-21 DIAGNOSIS — J449 Chronic obstructive pulmonary disease, unspecified: Secondary | ICD-10-CM | POA: Diagnosis not present

## 2023-10-29 DIAGNOSIS — J449 Chronic obstructive pulmonary disease, unspecified: Secondary | ICD-10-CM | POA: Diagnosis not present

## 2023-11-03 DIAGNOSIS — J441 Chronic obstructive pulmonary disease with (acute) exacerbation: Secondary | ICD-10-CM | POA: Diagnosis not present

## 2023-11-03 DIAGNOSIS — G4733 Obstructive sleep apnea (adult) (pediatric): Secondary | ICD-10-CM | POA: Diagnosis not present

## 2023-11-03 DIAGNOSIS — J9611 Chronic respiratory failure with hypoxia: Secondary | ICD-10-CM | POA: Diagnosis not present

## 2023-11-03 DIAGNOSIS — J301 Allergic rhinitis due to pollen: Secondary | ICD-10-CM | POA: Diagnosis not present

## 2023-11-06 DIAGNOSIS — G4733 Obstructive sleep apnea (adult) (pediatric): Secondary | ICD-10-CM | POA: Diagnosis not present

## 2023-11-09 DIAGNOSIS — R627 Adult failure to thrive: Secondary | ICD-10-CM | POA: Diagnosis not present

## 2023-11-18 ENCOUNTER — Ambulatory Visit: Payer: 59 | Admitting: Cardiology

## 2023-11-18 ENCOUNTER — Ambulatory Visit: Payer: 59 | Attending: Cardiology | Admitting: Cardiology

## 2023-11-18 ENCOUNTER — Encounter: Payer: Self-pay | Admitting: Cardiology

## 2023-11-18 VITALS — BP 118/80 | HR 90 | Ht 64.0 in | Wt 198.0 lb

## 2023-11-18 DIAGNOSIS — Z8673 Personal history of transient ischemic attack (TIA), and cerebral infarction without residual deficits: Secondary | ICD-10-CM

## 2023-11-18 DIAGNOSIS — I5032 Chronic diastolic (congestive) heart failure: Secondary | ICD-10-CM | POA: Diagnosis not present

## 2023-11-18 DIAGNOSIS — J410 Simple chronic bronchitis: Secondary | ICD-10-CM

## 2023-11-18 DIAGNOSIS — Z89612 Acquired absence of left leg above knee: Secondary | ICD-10-CM | POA: Diagnosis not present

## 2023-11-18 DIAGNOSIS — I480 Paroxysmal atrial fibrillation: Secondary | ICD-10-CM | POA: Diagnosis not present

## 2023-11-18 NOTE — Patient Instructions (Signed)
 Medication Instructions:  Your physician recommends that you continue on your current medications as directed. Please refer to the Current Medication list given to you today.  *If you need a refill on your cardiac medications before your next appointment, please call your pharmacy*   Lab Work: CBC- today If you have labs (blood work) drawn today and your tests are completely normal, you will receive your results only by: MyChart Message (if you have MyChart) OR A paper copy in the mail If you have any lab test that is abnormal or we need to change your treatment, we will call you to review the results.   Testing/Procedures: None Ordered   Follow-Up: At [facility], you and your health needs are our priority.  As part of our continuing mission to provide you with exceptional heart care, we have created designated Provider Care Teams.  These Care Teams include your primary Cardiologist (physician) and Advanced Practice Providers (APPs -  Physician Assistants and Nurse Practitioners) who all work together to provide you with the care you need, when you need it.  We recommend signing up for the patient portal called "MyChart".  Sign up information is provided on this After Visit Summary.  MyChart is used to connect with patients for Virtual Visits (Telemedicine).  Patients are able to view lab/test results, encounter notes, upcoming appointments, etc.  Non-urgent messages can be sent to your provider as well.   To learn more about what you can do with MyChart, go to [email or web address].    Your next appointment:   6 month(s)  The format for your next appointment:   In Person  Provider:   Gypsy Balsam, MD    Other Instructions NA

## 2023-11-18 NOTE — Addendum Note (Signed)
 Addended by: Baldo Ash D on: 11/18/2023 09:46 AM   Modules accepted: Orders

## 2023-11-18 NOTE — Progress Notes (Signed)
 Cardiology Office Note:    Date:  11/18/2023   ID:  Amanda Lewis, DOB 1956-05-24, MRN [id number]  PCP:  Eunice Blase, PA-C  Cardiologist:  Gypsy Balsam, MD    Referring MD: Eunice Blase, PA-C   Chief Complaint  Patient presents with   Follow-up    History of Present Illness:    Amanda Lewis is a 68 y.o. female past medical history significant for essential hypertension, obstructive sleep apnea, depression, history of CVA, above-knee amputation on the left side, paroxysmal atrial fibrillation, CHADS2 Vascor equals 5.  She is anticoagulated we will continue rhythm control strategy with Multaq. Comes today to months for follow-up overall doing well.  Denies have any chest pain tightness squeezing pressure burning chest.  Overall feeling well no palpitations  Past Medical History:  Diagnosis Date   Anemia    Anxiety    on treatment   Arthritis    arms   Asthma    exertional   Chronic lower back pain    Community acquired bacterial pneumonia 07/20/2018   Complex regional pain syndrome of left lower extremity 01/13/2012   Constipation 07/20/2018   Last Assessment & Plan:  Formatting of this note might be different from the original. -continue senokot and miralax -added suppository and sorbitol today  -order KUB if develops abd pain/n/v   COPD (chronic obstructive pulmonary disease) (HCC)    usesO2 24/7   Depression    DNR (do not resuscitate)    Dysuria-frequency syndrome    GERD (gastroesophageal reflux disease)    Headache(784.0)    intermittent migraines   History of CVA (cerebrovascular accident) 07/18/2018   Last Assessment & Plan:  Formatting of this note might be different from the original. -not on antiplatelet therapy currently - has allergy (urticaria/hives) to aspirin  -would consider starting plavix monotherapy for secondary stroke prevention after completing DVT prophylaxis  -continue statin   History of home oxygen therapy 07/18/2018   History of kidney stones     Left acetabular fracture (HCC) 07/18/2018   Last Assessment & Plan:  Formatting of this note might be different from the original. -ortho plans for non operative management -Management per ortho primary team: PT/OT, wound care, weight bearing status and pain control -PT/OT rec SNF vs progression to home with HHPT   Left upper quadrant pain 07/18/2018   Last Assessment & Plan:  Formatting of this note might be different from the original. Lipase neg 2/2 8th rib fracture   -lidocaine patch  -encouraged IS   Leukocytosis 07/18/2018   Last Assessment & Plan:  Formatting of this note is different from the original. Lab Results  Component Value Date   WBC 14.9 (H) 07/20/2018  CXR and UA normal   -likely reactive, may be developing PNA -will start doxycycline -repeat CBC tomorrow   Major depression 07/18/2018   Last Assessment & Plan:  Formatting of this note might be different from the original. QTc 438 11/19  -continue home lexapro, wellbutrin, duloxetine, seroquel, and trazodone  -continue diazepam prn  -counseled her on polypharmacy and asking her PCP to try to wean her off some of these medications   Multiple thyroid nodules    Pneumonia    Recurrent upper respiratory infection (URI)    Rotator cuff tear, right    RSD lower limb    left lower leg   S/P AKA (above knee amputation) (HCC) 07/19/2015   Sleep apnea 2010   uses continous oxygen   Stroke (HCC) 1997  Past Surgical History:  Procedure Laterality Date   ABDOMINAL HYSTERECTOMY  2003   AMPUTATION  01/12/2012   Procedure: AMPUTATION ABOVE KNEE;  Surgeon: Nadara Mustard, MD;  Location: [facility] OR;  Service: Orthopedics;  Laterality: Left;  Left Above Knee Amputation   APPENDECTOMY     FRACTURE SURGERY     Morphine Pump     Morphine Pump Removed  2006   ORIF ANKLE FRACTURE  1995   STUMP REVISION Left 07/19/2015   Procedure: Revision Left Above Knee Amputation;  Surgeon: Nadara Mustard, MD;  Location: [facility] OR;  Service: Orthopedics;   Laterality: Left;    Current Medications: Current Meds  Medication Sig   acetaZOLAMIDE (DIAMOX) 500 MG capsule Take 1 capsule (500 mg total) by mouth daily.   albuterol (PROVENTIL HFA;VENTOLIN HFA) 108 (90 BASE) MCG/ACT inhaler Inhale 2 puffs into the lungs every 6 (six) hours as needed for wheezing or shortness of breath. For shortness of breath   alendronate (FOSAMAX) 70 MG tablet Take 70 mg by mouth once a week.   ANORO ELLIPTA 62.5-25 MCG/INH AEPB Inhale 1 puff into the lungs daily.   apixaban (ELIQUIS) 5 MG TABS tablet Take 1 tablet (5 mg total) by mouth 2 (two) times daily.   atorvastatin (LIPITOR) 40 MG tablet Take 2 tablets (80 mg total) by mouth daily at 6 PM.   atorvastatin (LIPITOR) 80 MG tablet Take 80 mg by mouth daily.   cyclobenzaprine (FLEXERIL) 5 MG tablet Take 5 mg by mouth 2 (two) times daily as needed for spasms.   diltiazem (CARDIZEM CD) 120 MG 24 hr capsule Take 1 capsule (120 mg total) by mouth daily.   dronedarone (MULTAQ) 400 MG tablet Take 1 tablet (400 mg total) by mouth 2 (two) times daily.   FLUoxetine (PROZAC) 20 MG capsule Take 20 mg by mouth every morning.   fluticasone (FLONASE) 50 MCG/ACT nasal spray Place 2 sprays into both nostrils daily.   gabapentin (NEURONTIN) 300 MG capsule Take 2 capsules (600 mg total) by mouth 2 (two) times daily.   gabapentin (NEURONTIN) 300 MG capsule Take 1 capsule (300 mg total) by mouth 3 (three) times daily. 3 times a day when necessary neuropathy pain   gabapentin (NEURONTIN) 400 MG capsule Take 400 mg by mouth 3 (three) times daily.   hydrochlorothiazide (HYDRODIURIL) 25 MG tablet Take 0.5 tablets (12.5 mg total) by mouth daily.   loratadine (CLARITIN) 10 MG tablet Take 10 mg by mouth daily.   MYRBETRIQ 50 MG TB24 tablet Take 50 mg by mouth daily.   omeprazole (PRILOSEC) 40 MG capsule Take 40 mg by mouth daily.   oxyCODONE-acetaminophen (ROXICET) 5-325 MG tablet Take 1 tablet by mouth every 8 (eight) hours as needed for  severe pain.   traZODone (DESYREL) 100 MG tablet Take 100 mg by mouth at bedtime.   TRELEGY ELLIPTA 100-62.5-25 MCG/INH AEPB Inhale 1 puff into the lungs daily.     Allergies:   Aspirin, Bactrim [sulfamethoxazole-trimethoprim], Nitroglycerin, Oxycodone-aspirin, Percodan [oxycodone-aspirin], Sulfamethoxazole-trimethoprim, and Tape   Social History   Socioeconomic History   Marital status: Legally Separated    Spouse name: Not on file   Number of children: Not on file   Years of education: Not on file   Highest education level: Not on file  Occupational History   Occupation: disabled  Tobacco Use   Smoking status: Former    Current packs/day: 0.00    Average packs/day: 2.0 packs/day for 35.0 years (70.0 ttl pk-yrs)  Types: Cigarettes    Start date: 09/27/1973    Quit date: 09/27/2008    Years since quitting: 15.1   Smokeless tobacco: Never  Substance and Sexual Activity   Alcohol use: No   Drug use: No   Sexual activity: Not Currently    Birth control/protection: Surgical  Other Topics Concern   Not on file  Social History Narrative   Not on file   Social Drivers of Health   Financial Resource Strain: Not on file  Food Insecurity: Not on file  Transportation Needs: Not on file  Physical Activity: Not on file  Stress: Not on file  Social Connections: Not on file     Family History: The patient's family history is negative for Anesthesia problems. ROS:   Please see the history of present illness.    All 14 point review of systems negative except as described per history of present illness  EKGs/Labs/Other Studies Reviewed:         Recent Labs: No results found for requested labs within last 365 days.  Recent Lipid Panel No results found for: "CHOL", "TRIG", "HDL", "CHOLHDL", "VLDL", "LDLCALC", "LDLDIRECT"  Physical Exam:    VS:  BP 118/80 (BP Location: Right Arm, Patient Position: Sitting)   Pulse 90   Ht 5\' 4"  (1.626 m)   Wt 198 lb (89.8 kg)   SpO2 94%    BMI 33.99 kg/m     Wt Readings from Last 3 Encounters:  11/18/23 198 lb (89.8 kg)  03/22/23 198 lb (89.8 kg)  04/20/22 183 lb (83 kg)     GEN:  Well nourished, well developed in no acute distress HEENT: Normal NECK: No JVD; No carotid bruits LYMPHATICS: No lymphadenopathy CARDIAC: RRR, no murmurs, no rubs, no gallops RESPIRATORY:  Clear to auscultation without rales, wheezing or rhonchi  ABDOMEN: Soft, non-tender, non-distended MUSCULOSKELETAL:  No edema; No deformity  SKIN: Warm and dry LOWER EXTREMITIES: no swelling NEUROLOGIC:  Alert and oriented x 3 PSYCHIATRIC:  Normal affect   ASSESSMENT:    1. Chronic heart failure with preserved ejection fraction (HCC)   2. Paroxysmal atrial fibrillation (HCC)   3. Simple chronic bronchitis (HCC)   4. History of CVA (cerebrovascular accident)   5. 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  I expressed to her the reason for her anticoagulation and she understood stable Chronic diastolic congestive heart failure overall looks good.  No evidence of decompensation continue present management. She looks somewhat pale I will check CBC today.  Again she is taking Eliquis.  Last hemoglobin I have is 10.3 from December. History of CVA stable no new issues. Essential hypertension blood pressure well-controlled. Dyslipidemia I did review K PN I have data from August of last year LDL 72 HDL 65 continue present management which include high intensity statin for of Lipitor 80   Medication Adjustments/Labs and Tests Ordered: Current medicines are reviewed at length with the patient today.  Concerns regarding medicines are outlined above.  No orders of the defined types were placed in this encounter.  Medication changes: No orders of the defined types were placed in this  encounter.   Signed, Georgeanna Lea, MD, [facility] 11/18/2023 9:39 AM    [facility]

## 2023-11-19 LAB — CBC
Hematocrit: 26.5 % — ABNORMAL LOW (ref 34.0–46.6)
Hemoglobin: 7.2 g/dL — ABNORMAL LOW (ref 11.1–15.9)
MCH: 21.2 pg — ABNORMAL LOW (ref 26.6–33.0)
MCHC: 27.2 g/dL — ABNORMAL LOW (ref 31.5–35.7)
MCV: 78 fL — ABNORMAL LOW (ref 79–97)
Platelets: 334 10*3/uL (ref 150–450)
RBC: 3.39 x10E6/uL — ABNORMAL LOW (ref 3.77–5.28)
RDW: 15.4 % (ref 11.7–15.4)
WBC: 8.5 10*3/uL (ref 3.4–10.8)

## 2023-11-24 ENCOUNTER — Telehealth: Payer: Self-pay

## 2023-11-24 NOTE — Telephone Encounter (Signed)
 Called pt to discuss Hgb. LVM to call. Home number was not working number.Called contact person Daphne. He went to her house and she came to the phone. Advised per Dr. Bing Matter that her HGB was 7.2 and it would be best for her to be checked in the ED. She verbalized understanding and had no further questions.

## 2023-11-25 ENCOUNTER — Telehealth: Payer: Self-pay

## 2023-11-25 DIAGNOSIS — D509 Iron deficiency anemia, unspecified: Secondary | ICD-10-CM | POA: Diagnosis not present

## 2023-11-25 DIAGNOSIS — I4891 Unspecified atrial fibrillation: Secondary | ICD-10-CM | POA: Diagnosis not present

## 2023-11-25 DIAGNOSIS — R531 Weakness: Secondary | ICD-10-CM | POA: Diagnosis not present

## 2023-11-25 DIAGNOSIS — Z79899 Other long term (current) drug therapy: Secondary | ICD-10-CM | POA: Diagnosis not present

## 2023-11-25 DIAGNOSIS — I110 Hypertensive heart disease with heart failure: Secondary | ICD-10-CM | POA: Diagnosis not present

## 2023-11-25 NOTE — Telephone Encounter (Signed)
 Caller Sue Lush) is reporting critical lab results.

## 2023-11-25 NOTE — Telephone Encounter (Signed)
 Spoke with Sue Lush at [facility]. Hgb was 6.9. Mrs. Samet was waiting in the lobby and the phlebotomist was going to tell her Dr. Bing Matter said to go to the ED because her Hgb was worse.

## 2023-11-25 NOTE — Telephone Encounter (Signed)
 Pt came by office. Sent to [facility] to get CBC stat.

## 2023-11-27 DIAGNOSIS — D509 Iron deficiency anemia, unspecified: Secondary | ICD-10-CM | POA: Diagnosis not present

## 2023-11-27 DIAGNOSIS — J9611 Chronic respiratory failure with hypoxia: Secondary | ICD-10-CM | POA: Diagnosis not present

## 2023-11-27 DIAGNOSIS — Z87891 Personal history of nicotine dependence: Secondary | ICD-10-CM | POA: Diagnosis not present

## 2023-11-27 DIAGNOSIS — I509 Heart failure, unspecified: Secondary | ICD-10-CM | POA: Diagnosis not present

## 2023-11-27 DIAGNOSIS — Z87442 Personal history of urinary calculi: Secondary | ICD-10-CM | POA: Diagnosis not present

## 2023-11-27 DIAGNOSIS — R739 Hyperglycemia, unspecified: Secondary | ICD-10-CM | POA: Diagnosis not present

## 2023-11-27 DIAGNOSIS — R109 Unspecified abdominal pain: Secondary | ICD-10-CM | POA: Diagnosis not present

## 2023-11-27 DIAGNOSIS — D649 Anemia, unspecified: Secondary | ICD-10-CM | POA: Diagnosis not present

## 2023-11-27 DIAGNOSIS — N200 Calculus of kidney: Secondary | ICD-10-CM | POA: Diagnosis not present

## 2023-11-27 DIAGNOSIS — J449 Chronic obstructive pulmonary disease, unspecified: Secondary | ICD-10-CM | POA: Diagnosis not present

## 2023-11-27 DIAGNOSIS — E876 Hypokalemia: Secondary | ICD-10-CM | POA: Diagnosis not present

## 2023-11-27 DIAGNOSIS — Z7901 Long term (current) use of anticoagulants: Secondary | ICD-10-CM | POA: Diagnosis not present

## 2023-11-27 DIAGNOSIS — Z89612 Acquired absence of left leg above knee: Secondary | ICD-10-CM | POA: Diagnosis not present

## 2023-11-27 DIAGNOSIS — I4891 Unspecified atrial fibrillation: Secondary | ICD-10-CM | POA: Diagnosis not present

## 2023-11-27 DIAGNOSIS — Z87440 Personal history of urinary (tract) infections: Secondary | ICD-10-CM | POA: Diagnosis not present

## 2023-11-27 DIAGNOSIS — Z882 Allergy status to sulfonamides status: Secondary | ICD-10-CM | POA: Diagnosis not present

## 2023-11-27 DIAGNOSIS — E7800 Pure hypercholesterolemia, unspecified: Secondary | ICD-10-CM | POA: Diagnosis not present

## 2023-11-27 DIAGNOSIS — Z880 Allergy status to penicillin: Secondary | ICD-10-CM | POA: Diagnosis not present

## 2023-11-27 DIAGNOSIS — Z79899 Other long term (current) drug therapy: Secondary | ICD-10-CM | POA: Diagnosis not present

## 2023-11-28 DIAGNOSIS — Z743 Need for continuous supervision: Secondary | ICD-10-CM | POA: Diagnosis not present

## 2023-11-28 DIAGNOSIS — J988 Other specified respiratory disorders: Secondary | ICD-10-CM | POA: Diagnosis not present

## 2023-11-28 DIAGNOSIS — Z7901 Long term (current) use of anticoagulants: Secondary | ICD-10-CM | POA: Diagnosis not present

## 2023-11-28 DIAGNOSIS — T07XXXA Unspecified multiple injuries, initial encounter: Secondary | ICD-10-CM | POA: Diagnosis not present

## 2023-11-28 DIAGNOSIS — R531 Weakness: Secondary | ICD-10-CM | POA: Diagnosis not present

## 2023-11-28 DIAGNOSIS — J9611 Chronic respiratory failure with hypoxia: Secondary | ICD-10-CM | POA: Diagnosis not present

## 2023-11-28 DIAGNOSIS — Z7401 Bed confinement status: Secondary | ICD-10-CM | POA: Diagnosis not present

## 2023-11-28 DIAGNOSIS — D649 Anemia, unspecified: Secondary | ICD-10-CM | POA: Diagnosis not present

## 2023-11-29 DIAGNOSIS — J449 Chronic obstructive pulmonary disease, unspecified: Secondary | ICD-10-CM | POA: Diagnosis not present

## 2023-11-30 DIAGNOSIS — I509 Heart failure, unspecified: Secondary | ICD-10-CM | POA: Diagnosis not present

## 2023-11-30 DIAGNOSIS — R911 Solitary pulmonary nodule: Secondary | ICD-10-CM | POA: Diagnosis not present

## 2023-11-30 DIAGNOSIS — G4700 Insomnia, unspecified: Secondary | ICD-10-CM | POA: Diagnosis not present

## 2023-11-30 DIAGNOSIS — J9611 Chronic respiratory failure with hypoxia: Secondary | ICD-10-CM | POA: Diagnosis not present

## 2023-11-30 DIAGNOSIS — N3281 Overactive bladder: Secondary | ICD-10-CM | POA: Diagnosis not present

## 2023-11-30 DIAGNOSIS — I051 Rheumatic mitral insufficiency: Secondary | ICD-10-CM | POA: Diagnosis not present

## 2023-11-30 DIAGNOSIS — K760 Fatty (change of) liver, not elsewhere classified: Secondary | ICD-10-CM | POA: Diagnosis not present

## 2023-11-30 DIAGNOSIS — E041 Nontoxic single thyroid nodule: Secondary | ICD-10-CM | POA: Diagnosis not present

## 2023-11-30 DIAGNOSIS — R739 Hyperglycemia, unspecified: Secondary | ICD-10-CM | POA: Diagnosis not present

## 2023-11-30 DIAGNOSIS — E7800 Pure hypercholesterolemia, unspecified: Secondary | ICD-10-CM | POA: Diagnosis not present

## 2023-11-30 DIAGNOSIS — M810 Age-related osteoporosis without current pathological fracture: Secondary | ICD-10-CM | POA: Diagnosis not present

## 2023-11-30 DIAGNOSIS — M1990 Unspecified osteoarthritis, unspecified site: Secondary | ICD-10-CM | POA: Diagnosis not present

## 2023-11-30 DIAGNOSIS — I69354 Hemiplegia and hemiparesis following cerebral infarction affecting left non-dominant side: Secondary | ICD-10-CM | POA: Diagnosis not present

## 2023-11-30 DIAGNOSIS — J4489 Other specified chronic obstructive pulmonary disease: Secondary | ICD-10-CM | POA: Diagnosis not present

## 2023-11-30 DIAGNOSIS — G4733 Obstructive sleep apnea (adult) (pediatric): Secondary | ICD-10-CM | POA: Diagnosis not present

## 2023-11-30 DIAGNOSIS — I700 Atherosclerosis of aorta: Secondary | ICD-10-CM | POA: Diagnosis not present

## 2023-11-30 DIAGNOSIS — I4891 Unspecified atrial fibrillation: Secondary | ICD-10-CM | POA: Diagnosis not present

## 2023-11-30 DIAGNOSIS — Z7901 Long term (current) use of anticoagulants: Secondary | ICD-10-CM | POA: Diagnosis not present

## 2023-11-30 DIAGNOSIS — I110 Hypertensive heart disease with heart failure: Secondary | ICD-10-CM | POA: Diagnosis not present

## 2023-11-30 DIAGNOSIS — G629 Polyneuropathy, unspecified: Secondary | ICD-10-CM | POA: Diagnosis not present

## 2023-11-30 DIAGNOSIS — K219 Gastro-esophageal reflux disease without esophagitis: Secondary | ICD-10-CM | POA: Diagnosis not present

## 2023-11-30 DIAGNOSIS — G5600 Carpal tunnel syndrome, unspecified upper limb: Secondary | ICD-10-CM | POA: Diagnosis not present

## 2023-11-30 DIAGNOSIS — D509 Iron deficiency anemia, unspecified: Secondary | ICD-10-CM | POA: Diagnosis not present

## 2023-12-01 DIAGNOSIS — I4891 Unspecified atrial fibrillation: Secondary | ICD-10-CM | POA: Diagnosis not present

## 2023-12-01 DIAGNOSIS — I509 Heart failure, unspecified: Secondary | ICD-10-CM | POA: Diagnosis not present

## 2023-12-01 DIAGNOSIS — J449 Chronic obstructive pulmonary disease, unspecified: Secondary | ICD-10-CM | POA: Diagnosis not present

## 2023-12-01 DIAGNOSIS — D509 Iron deficiency anemia, unspecified: Secondary | ICD-10-CM | POA: Diagnosis not present

## 2023-12-05 DIAGNOSIS — M1990 Unspecified osteoarthritis, unspecified site: Secondary | ICD-10-CM | POA: Diagnosis not present

## 2023-12-05 DIAGNOSIS — D509 Iron deficiency anemia, unspecified: Secondary | ICD-10-CM | POA: Diagnosis not present

## 2023-12-05 DIAGNOSIS — E7800 Pure hypercholesterolemia, unspecified: Secondary | ICD-10-CM | POA: Diagnosis not present

## 2023-12-05 DIAGNOSIS — G629 Polyneuropathy, unspecified: Secondary | ICD-10-CM | POA: Diagnosis not present

## 2023-12-05 DIAGNOSIS — G4700 Insomnia, unspecified: Secondary | ICD-10-CM | POA: Diagnosis not present

## 2023-12-05 DIAGNOSIS — M810 Age-related osteoporosis without current pathological fracture: Secondary | ICD-10-CM | POA: Diagnosis not present

## 2023-12-05 DIAGNOSIS — I69354 Hemiplegia and hemiparesis following cerebral infarction affecting left non-dominant side: Secondary | ICD-10-CM | POA: Diagnosis not present

## 2023-12-05 DIAGNOSIS — N3281 Overactive bladder: Secondary | ICD-10-CM | POA: Diagnosis not present

## 2023-12-05 DIAGNOSIS — K760 Fatty (change of) liver, not elsewhere classified: Secondary | ICD-10-CM | POA: Diagnosis not present

## 2023-12-05 DIAGNOSIS — I110 Hypertensive heart disease with heart failure: Secondary | ICD-10-CM | POA: Diagnosis not present

## 2023-12-05 DIAGNOSIS — I700 Atherosclerosis of aorta: Secondary | ICD-10-CM | POA: Diagnosis not present

## 2023-12-05 DIAGNOSIS — G5600 Carpal tunnel syndrome, unspecified upper limb: Secondary | ICD-10-CM | POA: Diagnosis not present

## 2023-12-05 DIAGNOSIS — R911 Solitary pulmonary nodule: Secondary | ICD-10-CM | POA: Diagnosis not present

## 2023-12-05 DIAGNOSIS — E041 Nontoxic single thyroid nodule: Secondary | ICD-10-CM | POA: Diagnosis not present

## 2023-12-05 DIAGNOSIS — J9611 Chronic respiratory failure with hypoxia: Secondary | ICD-10-CM | POA: Diagnosis not present

## 2023-12-05 DIAGNOSIS — I051 Rheumatic mitral insufficiency: Secondary | ICD-10-CM | POA: Diagnosis not present

## 2023-12-05 DIAGNOSIS — R739 Hyperglycemia, unspecified: Secondary | ICD-10-CM | POA: Diagnosis not present

## 2023-12-05 DIAGNOSIS — K219 Gastro-esophageal reflux disease without esophagitis: Secondary | ICD-10-CM | POA: Diagnosis not present

## 2023-12-05 DIAGNOSIS — G4733 Obstructive sleep apnea (adult) (pediatric): Secondary | ICD-10-CM | POA: Diagnosis not present

## 2023-12-05 DIAGNOSIS — I509 Heart failure, unspecified: Secondary | ICD-10-CM | POA: Diagnosis not present

## 2023-12-05 DIAGNOSIS — Z7901 Long term (current) use of anticoagulants: Secondary | ICD-10-CM | POA: Diagnosis not present

## 2023-12-05 DIAGNOSIS — J4489 Other specified chronic obstructive pulmonary disease: Secondary | ICD-10-CM | POA: Diagnosis not present

## 2023-12-05 DIAGNOSIS — I4891 Unspecified atrial fibrillation: Secondary | ICD-10-CM | POA: Diagnosis not present

## 2023-12-07 ENCOUNTER — Telehealth: Payer: Self-pay | Admitting: Pharmacist

## 2023-12-07 ENCOUNTER — Other Ambulatory Visit: Payer: Self-pay | Admitting: Cardiology

## 2023-12-07 DIAGNOSIS — G4733 Obstructive sleep apnea (adult) (pediatric): Secondary | ICD-10-CM | POA: Diagnosis not present

## 2023-12-07 NOTE — Telephone Encounter (Signed)
 Received fax from [facility] with the following drug interactions  Diltiazem and Eliquis (can increase the concentration of Eliquis) Diltiazem and atorvastatin (can increase concentration of atovastatin- monitoring recommend, could consider decreasing atorvastatin to 40mg .) Fluoxetine and trazodone (not clinically relavent and we are not the prescriber)  Generally I would not be concerned about the interaction of diltiazem and Eliquis, but given that patient was just admitted with Hgb of 6.9, it is possible she is bleeding from somewhere. I cannot see the hospital notes. I will pass this along to Dr. Bing Matter.

## 2023-12-08 DIAGNOSIS — M1990 Unspecified osteoarthritis, unspecified site: Secondary | ICD-10-CM | POA: Diagnosis not present

## 2023-12-08 DIAGNOSIS — I110 Hypertensive heart disease with heart failure: Secondary | ICD-10-CM | POA: Diagnosis not present

## 2023-12-08 DIAGNOSIS — E041 Nontoxic single thyroid nodule: Secondary | ICD-10-CM | POA: Diagnosis not present

## 2023-12-08 DIAGNOSIS — I509 Heart failure, unspecified: Secondary | ICD-10-CM | POA: Diagnosis not present

## 2023-12-08 DIAGNOSIS — I700 Atherosclerosis of aorta: Secondary | ICD-10-CM | POA: Diagnosis not present

## 2023-12-08 DIAGNOSIS — K219 Gastro-esophageal reflux disease without esophagitis: Secondary | ICD-10-CM | POA: Diagnosis not present

## 2023-12-08 DIAGNOSIS — Z7901 Long term (current) use of anticoagulants: Secondary | ICD-10-CM | POA: Diagnosis not present

## 2023-12-08 DIAGNOSIS — I4891 Unspecified atrial fibrillation: Secondary | ICD-10-CM | POA: Diagnosis not present

## 2023-12-08 DIAGNOSIS — G4733 Obstructive sleep apnea (adult) (pediatric): Secondary | ICD-10-CM | POA: Diagnosis not present

## 2023-12-08 DIAGNOSIS — I051 Rheumatic mitral insufficiency: Secondary | ICD-10-CM | POA: Diagnosis not present

## 2023-12-08 DIAGNOSIS — R911 Solitary pulmonary nodule: Secondary | ICD-10-CM | POA: Diagnosis not present

## 2023-12-08 DIAGNOSIS — K760 Fatty (change of) liver, not elsewhere classified: Secondary | ICD-10-CM | POA: Diagnosis not present

## 2023-12-08 DIAGNOSIS — N3281 Overactive bladder: Secondary | ICD-10-CM | POA: Diagnosis not present

## 2023-12-08 DIAGNOSIS — D509 Iron deficiency anemia, unspecified: Secondary | ICD-10-CM | POA: Diagnosis not present

## 2023-12-08 DIAGNOSIS — M810 Age-related osteoporosis without current pathological fracture: Secondary | ICD-10-CM | POA: Diagnosis not present

## 2023-12-08 DIAGNOSIS — G5600 Carpal tunnel syndrome, unspecified upper limb: Secondary | ICD-10-CM | POA: Diagnosis not present

## 2023-12-08 DIAGNOSIS — R739 Hyperglycemia, unspecified: Secondary | ICD-10-CM | POA: Diagnosis not present

## 2023-12-08 DIAGNOSIS — E7800 Pure hypercholesterolemia, unspecified: Secondary | ICD-10-CM | POA: Diagnosis not present

## 2023-12-08 DIAGNOSIS — J9611 Chronic respiratory failure with hypoxia: Secondary | ICD-10-CM | POA: Diagnosis not present

## 2023-12-08 DIAGNOSIS — R627 Adult failure to thrive: Secondary | ICD-10-CM | POA: Diagnosis not present

## 2023-12-08 DIAGNOSIS — J4489 Other specified chronic obstructive pulmonary disease: Secondary | ICD-10-CM | POA: Diagnosis not present

## 2023-12-08 DIAGNOSIS — I69354 Hemiplegia and hemiparesis following cerebral infarction affecting left non-dominant side: Secondary | ICD-10-CM | POA: Diagnosis not present

## 2023-12-08 DIAGNOSIS — G4700 Insomnia, unspecified: Secondary | ICD-10-CM | POA: Diagnosis not present

## 2023-12-08 DIAGNOSIS — G629 Polyneuropathy, unspecified: Secondary | ICD-10-CM | POA: Diagnosis not present

## 2023-12-12 DIAGNOSIS — G4700 Insomnia, unspecified: Secondary | ICD-10-CM | POA: Diagnosis not present

## 2023-12-12 DIAGNOSIS — I509 Heart failure, unspecified: Secondary | ICD-10-CM | POA: Diagnosis not present

## 2023-12-12 DIAGNOSIS — K219 Gastro-esophageal reflux disease without esophagitis: Secondary | ICD-10-CM | POA: Diagnosis not present

## 2023-12-12 DIAGNOSIS — G629 Polyneuropathy, unspecified: Secondary | ICD-10-CM | POA: Diagnosis not present

## 2023-12-12 DIAGNOSIS — R739 Hyperglycemia, unspecified: Secondary | ICD-10-CM | POA: Diagnosis not present

## 2023-12-12 DIAGNOSIS — N3281 Overactive bladder: Secondary | ICD-10-CM | POA: Diagnosis not present

## 2023-12-12 DIAGNOSIS — I4891 Unspecified atrial fibrillation: Secondary | ICD-10-CM | POA: Diagnosis not present

## 2023-12-12 DIAGNOSIS — Z7901 Long term (current) use of anticoagulants: Secondary | ICD-10-CM | POA: Diagnosis not present

## 2023-12-12 DIAGNOSIS — K760 Fatty (change of) liver, not elsewhere classified: Secondary | ICD-10-CM | POA: Diagnosis not present

## 2023-12-12 DIAGNOSIS — E7800 Pure hypercholesterolemia, unspecified: Secondary | ICD-10-CM | POA: Diagnosis not present

## 2023-12-12 DIAGNOSIS — I69354 Hemiplegia and hemiparesis following cerebral infarction affecting left non-dominant side: Secondary | ICD-10-CM | POA: Diagnosis not present

## 2023-12-12 DIAGNOSIS — I110 Hypertensive heart disease with heart failure: Secondary | ICD-10-CM | POA: Diagnosis not present

## 2023-12-12 DIAGNOSIS — I700 Atherosclerosis of aorta: Secondary | ICD-10-CM | POA: Diagnosis not present

## 2023-12-12 DIAGNOSIS — D509 Iron deficiency anemia, unspecified: Secondary | ICD-10-CM | POA: Diagnosis not present

## 2023-12-12 DIAGNOSIS — R911 Solitary pulmonary nodule: Secondary | ICD-10-CM | POA: Diagnosis not present

## 2023-12-12 DIAGNOSIS — M810 Age-related osteoporosis without current pathological fracture: Secondary | ICD-10-CM | POA: Diagnosis not present

## 2023-12-12 DIAGNOSIS — G4733 Obstructive sleep apnea (adult) (pediatric): Secondary | ICD-10-CM | POA: Diagnosis not present

## 2023-12-12 DIAGNOSIS — I051 Rheumatic mitral insufficiency: Secondary | ICD-10-CM | POA: Diagnosis not present

## 2023-12-12 DIAGNOSIS — E041 Nontoxic single thyroid nodule: Secondary | ICD-10-CM | POA: Diagnosis not present

## 2023-12-12 DIAGNOSIS — G5600 Carpal tunnel syndrome, unspecified upper limb: Secondary | ICD-10-CM | POA: Diagnosis not present

## 2023-12-12 DIAGNOSIS — M1990 Unspecified osteoarthritis, unspecified site: Secondary | ICD-10-CM | POA: Diagnosis not present

## 2023-12-12 DIAGNOSIS — J9611 Chronic respiratory failure with hypoxia: Secondary | ICD-10-CM | POA: Diagnosis not present

## 2023-12-12 DIAGNOSIS — J4489 Other specified chronic obstructive pulmonary disease: Secondary | ICD-10-CM | POA: Diagnosis not present

## 2023-12-15 DIAGNOSIS — Z7901 Long term (current) use of anticoagulants: Secondary | ICD-10-CM | POA: Diagnosis not present

## 2023-12-15 DIAGNOSIS — K760 Fatty (change of) liver, not elsewhere classified: Secondary | ICD-10-CM | POA: Diagnosis not present

## 2023-12-15 DIAGNOSIS — J4489 Other specified chronic obstructive pulmonary disease: Secondary | ICD-10-CM | POA: Diagnosis not present

## 2023-12-15 DIAGNOSIS — I4891 Unspecified atrial fibrillation: Secondary | ICD-10-CM | POA: Diagnosis not present

## 2023-12-15 DIAGNOSIS — G629 Polyneuropathy, unspecified: Secondary | ICD-10-CM | POA: Diagnosis not present

## 2023-12-15 DIAGNOSIS — N3281 Overactive bladder: Secondary | ICD-10-CM | POA: Diagnosis not present

## 2023-12-15 DIAGNOSIS — G4733 Obstructive sleep apnea (adult) (pediatric): Secondary | ICD-10-CM | POA: Diagnosis not present

## 2023-12-15 DIAGNOSIS — I051 Rheumatic mitral insufficiency: Secondary | ICD-10-CM | POA: Diagnosis not present

## 2023-12-15 DIAGNOSIS — M1990 Unspecified osteoarthritis, unspecified site: Secondary | ICD-10-CM | POA: Diagnosis not present

## 2023-12-15 DIAGNOSIS — J9611 Chronic respiratory failure with hypoxia: Secondary | ICD-10-CM | POA: Diagnosis not present

## 2023-12-15 DIAGNOSIS — K219 Gastro-esophageal reflux disease without esophagitis: Secondary | ICD-10-CM | POA: Diagnosis not present

## 2023-12-15 DIAGNOSIS — M810 Age-related osteoporosis without current pathological fracture: Secondary | ICD-10-CM | POA: Diagnosis not present

## 2023-12-15 DIAGNOSIS — I69354 Hemiplegia and hemiparesis following cerebral infarction affecting left non-dominant side: Secondary | ICD-10-CM | POA: Diagnosis not present

## 2023-12-15 DIAGNOSIS — I700 Atherosclerosis of aorta: Secondary | ICD-10-CM | POA: Diagnosis not present

## 2023-12-15 DIAGNOSIS — E7800 Pure hypercholesterolemia, unspecified: Secondary | ICD-10-CM | POA: Diagnosis not present

## 2023-12-15 DIAGNOSIS — G4700 Insomnia, unspecified: Secondary | ICD-10-CM | POA: Diagnosis not present

## 2023-12-15 DIAGNOSIS — D509 Iron deficiency anemia, unspecified: Secondary | ICD-10-CM | POA: Diagnosis not present

## 2023-12-15 DIAGNOSIS — R911 Solitary pulmonary nodule: Secondary | ICD-10-CM | POA: Diagnosis not present

## 2023-12-15 DIAGNOSIS — G5600 Carpal tunnel syndrome, unspecified upper limb: Secondary | ICD-10-CM | POA: Diagnosis not present

## 2023-12-15 DIAGNOSIS — R739 Hyperglycemia, unspecified: Secondary | ICD-10-CM | POA: Diagnosis not present

## 2023-12-15 DIAGNOSIS — I509 Heart failure, unspecified: Secondary | ICD-10-CM | POA: Diagnosis not present

## 2023-12-15 DIAGNOSIS — E041 Nontoxic single thyroid nodule: Secondary | ICD-10-CM | POA: Diagnosis not present

## 2023-12-15 DIAGNOSIS — I110 Hypertensive heart disease with heart failure: Secondary | ICD-10-CM | POA: Diagnosis not present

## 2023-12-19 DIAGNOSIS — D509 Iron deficiency anemia, unspecified: Secondary | ICD-10-CM | POA: Diagnosis not present

## 2023-12-19 DIAGNOSIS — E785 Hyperlipidemia, unspecified: Secondary | ICD-10-CM | POA: Diagnosis not present

## 2023-12-19 DIAGNOSIS — Z79899 Other long term (current) drug therapy: Secondary | ICD-10-CM | POA: Diagnosis not present

## 2023-12-19 DIAGNOSIS — R350 Frequency of micturition: Secondary | ICD-10-CM | POA: Diagnosis not present

## 2023-12-19 DIAGNOSIS — R739 Hyperglycemia, unspecified: Secondary | ICD-10-CM | POA: Diagnosis not present

## 2023-12-20 DIAGNOSIS — G4733 Obstructive sleep apnea (adult) (pediatric): Secondary | ICD-10-CM | POA: Diagnosis not present

## 2023-12-20 DIAGNOSIS — Z7901 Long term (current) use of anticoagulants: Secondary | ICD-10-CM | POA: Diagnosis not present

## 2023-12-20 DIAGNOSIS — I110 Hypertensive heart disease with heart failure: Secondary | ICD-10-CM | POA: Diagnosis not present

## 2023-12-20 DIAGNOSIS — E041 Nontoxic single thyroid nodule: Secondary | ICD-10-CM | POA: Diagnosis not present

## 2023-12-20 DIAGNOSIS — E7800 Pure hypercholesterolemia, unspecified: Secondary | ICD-10-CM | POA: Diagnosis not present

## 2023-12-20 DIAGNOSIS — I4891 Unspecified atrial fibrillation: Secondary | ICD-10-CM | POA: Diagnosis not present

## 2023-12-20 DIAGNOSIS — K219 Gastro-esophageal reflux disease without esophagitis: Secondary | ICD-10-CM | POA: Diagnosis not present

## 2023-12-20 DIAGNOSIS — J9611 Chronic respiratory failure with hypoxia: Secondary | ICD-10-CM | POA: Diagnosis not present

## 2023-12-20 DIAGNOSIS — G629 Polyneuropathy, unspecified: Secondary | ICD-10-CM | POA: Diagnosis not present

## 2023-12-20 DIAGNOSIS — M810 Age-related osteoporosis without current pathological fracture: Secondary | ICD-10-CM | POA: Diagnosis not present

## 2023-12-20 DIAGNOSIS — G4700 Insomnia, unspecified: Secondary | ICD-10-CM | POA: Diagnosis not present

## 2023-12-20 DIAGNOSIS — M1990 Unspecified osteoarthritis, unspecified site: Secondary | ICD-10-CM | POA: Diagnosis not present

## 2023-12-20 DIAGNOSIS — R739 Hyperglycemia, unspecified: Secondary | ICD-10-CM | POA: Diagnosis not present

## 2023-12-20 DIAGNOSIS — K760 Fatty (change of) liver, not elsewhere classified: Secondary | ICD-10-CM | POA: Diagnosis not present

## 2023-12-20 DIAGNOSIS — N3281 Overactive bladder: Secondary | ICD-10-CM | POA: Diagnosis not present

## 2023-12-20 DIAGNOSIS — I509 Heart failure, unspecified: Secondary | ICD-10-CM | POA: Diagnosis not present

## 2023-12-20 DIAGNOSIS — I69354 Hemiplegia and hemiparesis following cerebral infarction affecting left non-dominant side: Secondary | ICD-10-CM | POA: Diagnosis not present

## 2023-12-20 DIAGNOSIS — J4489 Other specified chronic obstructive pulmonary disease: Secondary | ICD-10-CM | POA: Diagnosis not present

## 2023-12-20 DIAGNOSIS — R911 Solitary pulmonary nodule: Secondary | ICD-10-CM | POA: Diagnosis not present

## 2023-12-20 DIAGNOSIS — D509 Iron deficiency anemia, unspecified: Secondary | ICD-10-CM | POA: Diagnosis not present

## 2023-12-20 DIAGNOSIS — I051 Rheumatic mitral insufficiency: Secondary | ICD-10-CM | POA: Diagnosis not present

## 2023-12-20 DIAGNOSIS — G5600 Carpal tunnel syndrome, unspecified upper limb: Secondary | ICD-10-CM | POA: Diagnosis not present

## 2023-12-20 DIAGNOSIS — I700 Atherosclerosis of aorta: Secondary | ICD-10-CM | POA: Diagnosis not present

## 2023-12-22 DIAGNOSIS — I110 Hypertensive heart disease with heart failure: Secondary | ICD-10-CM | POA: Diagnosis not present

## 2023-12-22 DIAGNOSIS — I051 Rheumatic mitral insufficiency: Secondary | ICD-10-CM | POA: Diagnosis not present

## 2023-12-22 DIAGNOSIS — G4733 Obstructive sleep apnea (adult) (pediatric): Secondary | ICD-10-CM | POA: Diagnosis not present

## 2023-12-22 DIAGNOSIS — N3281 Overactive bladder: Secondary | ICD-10-CM | POA: Diagnosis not present

## 2023-12-22 DIAGNOSIS — G5600 Carpal tunnel syndrome, unspecified upper limb: Secondary | ICD-10-CM | POA: Diagnosis not present

## 2023-12-22 DIAGNOSIS — E041 Nontoxic single thyroid nodule: Secondary | ICD-10-CM | POA: Diagnosis not present

## 2023-12-22 DIAGNOSIS — K219 Gastro-esophageal reflux disease without esophagitis: Secondary | ICD-10-CM | POA: Diagnosis not present

## 2023-12-22 DIAGNOSIS — I509 Heart failure, unspecified: Secondary | ICD-10-CM | POA: Diagnosis not present

## 2023-12-22 DIAGNOSIS — R911 Solitary pulmonary nodule: Secondary | ICD-10-CM | POA: Diagnosis not present

## 2023-12-22 DIAGNOSIS — K760 Fatty (change of) liver, not elsewhere classified: Secondary | ICD-10-CM | POA: Diagnosis not present

## 2023-12-22 DIAGNOSIS — I700 Atherosclerosis of aorta: Secondary | ICD-10-CM | POA: Diagnosis not present

## 2023-12-22 DIAGNOSIS — M1990 Unspecified osteoarthritis, unspecified site: Secondary | ICD-10-CM | POA: Diagnosis not present

## 2023-12-22 DIAGNOSIS — D509 Iron deficiency anemia, unspecified: Secondary | ICD-10-CM | POA: Diagnosis not present

## 2023-12-22 DIAGNOSIS — G4700 Insomnia, unspecified: Secondary | ICD-10-CM | POA: Diagnosis not present

## 2023-12-22 DIAGNOSIS — Z7901 Long term (current) use of anticoagulants: Secondary | ICD-10-CM | POA: Diagnosis not present

## 2023-12-22 DIAGNOSIS — I4891 Unspecified atrial fibrillation: Secondary | ICD-10-CM | POA: Diagnosis not present

## 2023-12-22 DIAGNOSIS — G629 Polyneuropathy, unspecified: Secondary | ICD-10-CM | POA: Diagnosis not present

## 2023-12-22 DIAGNOSIS — J9611 Chronic respiratory failure with hypoxia: Secondary | ICD-10-CM | POA: Diagnosis not present

## 2023-12-22 DIAGNOSIS — M810 Age-related osteoporosis without current pathological fracture: Secondary | ICD-10-CM | POA: Diagnosis not present

## 2023-12-22 DIAGNOSIS — I69354 Hemiplegia and hemiparesis following cerebral infarction affecting left non-dominant side: Secondary | ICD-10-CM | POA: Diagnosis not present

## 2023-12-22 DIAGNOSIS — E7800 Pure hypercholesterolemia, unspecified: Secondary | ICD-10-CM | POA: Diagnosis not present

## 2023-12-22 DIAGNOSIS — J4489 Other specified chronic obstructive pulmonary disease: Secondary | ICD-10-CM | POA: Diagnosis not present

## 2023-12-22 DIAGNOSIS — R739 Hyperglycemia, unspecified: Secondary | ICD-10-CM | POA: Diagnosis not present

## 2023-12-23 DIAGNOSIS — G4733 Obstructive sleep apnea (adult) (pediatric): Secondary | ICD-10-CM | POA: Diagnosis not present

## 2023-12-23 DIAGNOSIS — F5101 Primary insomnia: Secondary | ICD-10-CM | POA: Diagnosis not present

## 2023-12-26 ENCOUNTER — Other Ambulatory Visit: Payer: Self-pay | Admitting: Hematology and Oncology

## 2023-12-26 DIAGNOSIS — D509 Iron deficiency anemia, unspecified: Secondary | ICD-10-CM

## 2023-12-27 DIAGNOSIS — G4733 Obstructive sleep apnea (adult) (pediatric): Secondary | ICD-10-CM | POA: Diagnosis not present

## 2023-12-27 DIAGNOSIS — E7800 Pure hypercholesterolemia, unspecified: Secondary | ICD-10-CM | POA: Diagnosis not present

## 2023-12-27 DIAGNOSIS — I051 Rheumatic mitral insufficiency: Secondary | ICD-10-CM | POA: Diagnosis not present

## 2023-12-27 DIAGNOSIS — R911 Solitary pulmonary nodule: Secondary | ICD-10-CM | POA: Diagnosis not present

## 2023-12-27 DIAGNOSIS — R739 Hyperglycemia, unspecified: Secondary | ICD-10-CM | POA: Diagnosis not present

## 2023-12-27 DIAGNOSIS — I4891 Unspecified atrial fibrillation: Secondary | ICD-10-CM | POA: Diagnosis not present

## 2023-12-27 DIAGNOSIS — D509 Iron deficiency anemia, unspecified: Secondary | ICD-10-CM | POA: Diagnosis not present

## 2023-12-27 DIAGNOSIS — I110 Hypertensive heart disease with heart failure: Secondary | ICD-10-CM | POA: Diagnosis not present

## 2023-12-27 DIAGNOSIS — K219 Gastro-esophageal reflux disease without esophagitis: Secondary | ICD-10-CM | POA: Diagnosis not present

## 2023-12-27 DIAGNOSIS — M1990 Unspecified osteoarthritis, unspecified site: Secondary | ICD-10-CM | POA: Diagnosis not present

## 2023-12-27 DIAGNOSIS — Z7901 Long term (current) use of anticoagulants: Secondary | ICD-10-CM | POA: Diagnosis not present

## 2023-12-27 DIAGNOSIS — G629 Polyneuropathy, unspecified: Secondary | ICD-10-CM | POA: Diagnosis not present

## 2023-12-27 DIAGNOSIS — N3281 Overactive bladder: Secondary | ICD-10-CM | POA: Diagnosis not present

## 2023-12-27 DIAGNOSIS — J9611 Chronic respiratory failure with hypoxia: Secondary | ICD-10-CM | POA: Diagnosis not present

## 2023-12-27 DIAGNOSIS — M810 Age-related osteoporosis without current pathological fracture: Secondary | ICD-10-CM | POA: Diagnosis not present

## 2023-12-27 DIAGNOSIS — G4700 Insomnia, unspecified: Secondary | ICD-10-CM | POA: Diagnosis not present

## 2023-12-27 DIAGNOSIS — I509 Heart failure, unspecified: Secondary | ICD-10-CM | POA: Diagnosis not present

## 2023-12-27 DIAGNOSIS — E041 Nontoxic single thyroid nodule: Secondary | ICD-10-CM | POA: Diagnosis not present

## 2023-12-27 DIAGNOSIS — K760 Fatty (change of) liver, not elsewhere classified: Secondary | ICD-10-CM | POA: Diagnosis not present

## 2023-12-27 DIAGNOSIS — I69354 Hemiplegia and hemiparesis following cerebral infarction affecting left non-dominant side: Secondary | ICD-10-CM | POA: Diagnosis not present

## 2023-12-27 DIAGNOSIS — J4489 Other specified chronic obstructive pulmonary disease: Secondary | ICD-10-CM | POA: Diagnosis not present

## 2023-12-27 DIAGNOSIS — I700 Atherosclerosis of aorta: Secondary | ICD-10-CM | POA: Diagnosis not present

## 2023-12-27 DIAGNOSIS — G5600 Carpal tunnel syndrome, unspecified upper limb: Secondary | ICD-10-CM | POA: Diagnosis not present

## 2023-12-28 DIAGNOSIS — R911 Solitary pulmonary nodule: Secondary | ICD-10-CM | POA: Diagnosis not present

## 2023-12-28 DIAGNOSIS — G4733 Obstructive sleep apnea (adult) (pediatric): Secondary | ICD-10-CM | POA: Diagnosis not present

## 2023-12-28 DIAGNOSIS — J9611 Chronic respiratory failure with hypoxia: Secondary | ICD-10-CM | POA: Diagnosis not present

## 2023-12-28 DIAGNOSIS — K219 Gastro-esophageal reflux disease without esophagitis: Secondary | ICD-10-CM | POA: Diagnosis not present

## 2023-12-28 DIAGNOSIS — I509 Heart failure, unspecified: Secondary | ICD-10-CM | POA: Diagnosis not present

## 2023-12-28 DIAGNOSIS — G629 Polyneuropathy, unspecified: Secondary | ICD-10-CM | POA: Diagnosis not present

## 2023-12-28 DIAGNOSIS — I69354 Hemiplegia and hemiparesis following cerebral infarction affecting left non-dominant side: Secondary | ICD-10-CM | POA: Diagnosis not present

## 2023-12-28 DIAGNOSIS — G5600 Carpal tunnel syndrome, unspecified upper limb: Secondary | ICD-10-CM | POA: Diagnosis not present

## 2023-12-28 DIAGNOSIS — N3281 Overactive bladder: Secondary | ICD-10-CM | POA: Diagnosis not present

## 2023-12-28 DIAGNOSIS — M810 Age-related osteoporosis without current pathological fracture: Secondary | ICD-10-CM | POA: Diagnosis not present

## 2023-12-28 DIAGNOSIS — I4891 Unspecified atrial fibrillation: Secondary | ICD-10-CM | POA: Diagnosis not present

## 2023-12-28 DIAGNOSIS — E041 Nontoxic single thyroid nodule: Secondary | ICD-10-CM | POA: Diagnosis not present

## 2023-12-28 DIAGNOSIS — Z7901 Long term (current) use of anticoagulants: Secondary | ICD-10-CM | POA: Diagnosis not present

## 2023-12-28 DIAGNOSIS — E7800 Pure hypercholesterolemia, unspecified: Secondary | ICD-10-CM | POA: Diagnosis not present

## 2023-12-28 DIAGNOSIS — G4700 Insomnia, unspecified: Secondary | ICD-10-CM | POA: Diagnosis not present

## 2023-12-28 DIAGNOSIS — D509 Iron deficiency anemia, unspecified: Secondary | ICD-10-CM | POA: Diagnosis not present

## 2023-12-28 DIAGNOSIS — M1990 Unspecified osteoarthritis, unspecified site: Secondary | ICD-10-CM | POA: Diagnosis not present

## 2023-12-28 DIAGNOSIS — K760 Fatty (change of) liver, not elsewhere classified: Secondary | ICD-10-CM | POA: Diagnosis not present

## 2023-12-28 DIAGNOSIS — I051 Rheumatic mitral insufficiency: Secondary | ICD-10-CM | POA: Diagnosis not present

## 2023-12-28 DIAGNOSIS — I700 Atherosclerosis of aorta: Secondary | ICD-10-CM | POA: Diagnosis not present

## 2023-12-28 DIAGNOSIS — J4489 Other specified chronic obstructive pulmonary disease: Secondary | ICD-10-CM | POA: Diagnosis not present

## 2023-12-28 DIAGNOSIS — I110 Hypertensive heart disease with heart failure: Secondary | ICD-10-CM | POA: Diagnosis not present

## 2023-12-28 DIAGNOSIS — R739 Hyperglycemia, unspecified: Secondary | ICD-10-CM | POA: Diagnosis not present

## 2023-12-29 ENCOUNTER — Inpatient Hospital Stay: Attending: Hematology and Oncology | Admitting: Hematology and Oncology

## 2023-12-29 ENCOUNTER — Other Ambulatory Visit: Payer: Self-pay | Admitting: Hematology and Oncology

## 2023-12-29 ENCOUNTER — Encounter: Payer: Self-pay | Admitting: Hematology and Oncology

## 2023-12-29 ENCOUNTER — Inpatient Hospital Stay

## 2023-12-29 ENCOUNTER — Other Ambulatory Visit: Payer: Self-pay

## 2023-12-29 VITALS — BP 115/55 | HR 109 | Temp 97.9°F | Resp 16 | Ht 64.0 in | Wt 198.0 lb

## 2023-12-29 DIAGNOSIS — G4733 Obstructive sleep apnea (adult) (pediatric): Secondary | ICD-10-CM | POA: Diagnosis not present

## 2023-12-29 DIAGNOSIS — I700 Atherosclerosis of aorta: Secondary | ICD-10-CM | POA: Diagnosis not present

## 2023-12-29 DIAGNOSIS — Z87891 Personal history of nicotine dependence: Secondary | ICD-10-CM | POA: Diagnosis not present

## 2023-12-29 DIAGNOSIS — E7800 Pure hypercholesterolemia, unspecified: Secondary | ICD-10-CM | POA: Diagnosis not present

## 2023-12-29 DIAGNOSIS — K219 Gastro-esophageal reflux disease without esophagitis: Secondary | ICD-10-CM | POA: Diagnosis not present

## 2023-12-29 DIAGNOSIS — N3281 Overactive bladder: Secondary | ICD-10-CM | POA: Diagnosis not present

## 2023-12-29 DIAGNOSIS — R911 Solitary pulmonary nodule: Secondary | ICD-10-CM | POA: Diagnosis not present

## 2023-12-29 DIAGNOSIS — D509 Iron deficiency anemia, unspecified: Secondary | ICD-10-CM

## 2023-12-29 DIAGNOSIS — D72829 Elevated white blood cell count, unspecified: Secondary | ICD-10-CM

## 2023-12-29 DIAGNOSIS — E041 Nontoxic single thyroid nodule: Secondary | ICD-10-CM | POA: Diagnosis not present

## 2023-12-29 DIAGNOSIS — R739 Hyperglycemia, unspecified: Secondary | ICD-10-CM | POA: Diagnosis not present

## 2023-12-29 DIAGNOSIS — G4700 Insomnia, unspecified: Secondary | ICD-10-CM | POA: Diagnosis not present

## 2023-12-29 DIAGNOSIS — Z7901 Long term (current) use of anticoagulants: Secondary | ICD-10-CM | POA: Diagnosis not present

## 2023-12-29 DIAGNOSIS — M1990 Unspecified osteoarthritis, unspecified site: Secondary | ICD-10-CM | POA: Diagnosis not present

## 2023-12-29 DIAGNOSIS — G629 Polyneuropathy, unspecified: Secondary | ICD-10-CM | POA: Diagnosis not present

## 2023-12-29 DIAGNOSIS — I69354 Hemiplegia and hemiparesis following cerebral infarction affecting left non-dominant side: Secondary | ICD-10-CM | POA: Diagnosis not present

## 2023-12-29 DIAGNOSIS — K760 Fatty (change of) liver, not elsewhere classified: Secondary | ICD-10-CM | POA: Diagnosis not present

## 2023-12-29 DIAGNOSIS — J4489 Other specified chronic obstructive pulmonary disease: Secondary | ICD-10-CM | POA: Diagnosis not present

## 2023-12-29 DIAGNOSIS — I4891 Unspecified atrial fibrillation: Secondary | ICD-10-CM | POA: Diagnosis not present

## 2023-12-29 DIAGNOSIS — J9611 Chronic respiratory failure with hypoxia: Secondary | ICD-10-CM | POA: Diagnosis not present

## 2023-12-29 DIAGNOSIS — J449 Chronic obstructive pulmonary disease, unspecified: Secondary | ICD-10-CM | POA: Diagnosis not present

## 2023-12-29 DIAGNOSIS — I509 Heart failure, unspecified: Secondary | ICD-10-CM | POA: Diagnosis not present

## 2023-12-29 DIAGNOSIS — I051 Rheumatic mitral insufficiency: Secondary | ICD-10-CM | POA: Diagnosis not present

## 2023-12-29 DIAGNOSIS — G5600 Carpal tunnel syndrome, unspecified upper limb: Secondary | ICD-10-CM | POA: Diagnosis not present

## 2023-12-29 DIAGNOSIS — M810 Age-related osteoporosis without current pathological fracture: Secondary | ICD-10-CM | POA: Diagnosis not present

## 2023-12-29 DIAGNOSIS — I110 Hypertensive heart disease with heart failure: Secondary | ICD-10-CM | POA: Diagnosis not present

## 2023-12-29 HISTORY — DX: Iron deficiency anemia, unspecified: D50.9

## 2023-12-29 LAB — VITAMIN B12: Vitamin B-12: 298 pg/mL (ref 180–914)

## 2023-12-29 LAB — CBC WITH DIFFERENTIAL (CANCER CENTER ONLY)
Abs Immature Granulocytes: 0.01 10*3/uL (ref 0.00–0.07)
Basophils Absolute: 0 10*3/uL (ref 0.0–0.1)
Basophils Relative: 0 %
Eosinophils Absolute: 0.2 10*3/uL (ref 0.0–0.5)
Eosinophils Relative: 2 %
HCT: 28.8 % — ABNORMAL LOW (ref 36.0–46.0)
Hemoglobin: 7.8 g/dL — ABNORMAL LOW (ref 12.0–15.0)
Immature Granulocytes: 0 %
Lymphocytes Relative: 27 %
Lymphs Abs: 2.1 10*3/uL (ref 0.7–4.0)
MCH: 22 pg — ABNORMAL LOW (ref 26.0–34.0)
MCHC: 27.1 g/dL — ABNORMAL LOW (ref 30.0–36.0)
MCV: 81.1 fL (ref 80.0–100.0)
Monocytes Absolute: 0.5 10*3/uL (ref 0.1–1.0)
Monocytes Relative: 7 %
Neutro Abs: 4.7 10*3/uL (ref 1.7–7.7)
Neutrophils Relative %: 64 %
Platelet Count: 346 10*3/uL (ref 150–400)
RBC: 3.55 MIL/uL — ABNORMAL LOW (ref 3.87–5.11)
RDW: 16.1 % — ABNORMAL HIGH (ref 11.5–15.5)
WBC Count: 7.5 10*3/uL (ref 4.0–10.5)
nRBC: 0 % (ref 0.0–0.2)
nRBC: 0 /100{WBCs}

## 2023-12-29 LAB — IRON AND TIBC
Iron: 24 ug/dL — ABNORMAL LOW (ref 28–170)
Saturation Ratios: 4 % — ABNORMAL LOW (ref 10.4–31.8)
TIBC: 552 ug/dL — ABNORMAL HIGH (ref 250–450)
UIBC: 528 ug/dL

## 2023-12-29 LAB — CMP (CANCER CENTER ONLY)
ALT: 15 U/L (ref 0–44)
AST: 20 U/L (ref 15–41)
Albumin: 3.6 g/dL (ref 3.5–5.0)
Alkaline Phosphatase: 93 U/L (ref 38–126)
Anion gap: 9 (ref 5–15)
BUN: 11 mg/dL (ref 8–23)
CO2: 36 mmol/L — ABNORMAL HIGH (ref 22–32)
Calcium: 9.6 mg/dL (ref 8.9–10.3)
Chloride: 97 mmol/L — ABNORMAL LOW (ref 98–111)
Creatinine: 0.62 mg/dL (ref 0.44–1.00)
GFR, Estimated: >60 mL/min (ref >60)
Glucose, Bld: 138 mg/dL — ABNORMAL HIGH (ref 70–99)
Potassium: 3.3 mmol/L — ABNORMAL LOW (ref 3.5–5.1)
Sodium: 142 mmol/L (ref 135–145)
Total Bilirubin: 0.4 mg/dL (ref 0.0–1.2)
Total Protein: 7.1 g/dL (ref 6.5–8.1)

## 2023-12-29 LAB — SAMPLE TO BLOOD BANK

## 2023-12-29 LAB — FOLATE: Folate: 5.7 ng/mL — ABNORMAL LOW (ref >5.9)

## 2023-12-29 LAB — PREPARE RBC (CROSSMATCH)

## 2023-12-29 LAB — FERRITIN: Ferritin: 4 ng/mL — ABNORMAL LOW (ref 11–307)

## 2023-12-29 LAB — ABO/RH: ABO/RH(D): O NEG

## 2023-12-29 NOTE — Progress Notes (Cosign Needed)
 [facility] [address] [place],  [place]  [address] [phone]  Clinic Day:  12/29/2023   Referring physician: O'Buch, Greta, PA-C  Patient Care Team: Patient Care Team: O'Buch, Greta, PA-C as PCP - General (Internal Medicine)   REASON FOR CONSULTATION:  Iron deficiency anemia  HISTORY OF PRESENT ILLNESS:  Amanda Lewis is a 68 y.o. female with a history of iron deficiency anemia who is referred in consultation by Lujean Sake, PA-C for assessment and management. She has a history of requiring multiple blood transfusions over the last year. She states she used to get iron injections years ago and was then switched to oral iron. Her most recent transfusion occurred on 03/21 after a follow up with cardiology for atrial fibrillation. She feels better after infusions; however, it doesn't last long before she is fatigued again. She appears very pale today and complains of severe fatigue. She has a AKA of the left lower extremity and reports increased difficulty transferring from her wheelchair. She denies fever, chills, nausea or vomiting. She does have chronic respiratory conditions and wears oxygen . She does not note an increase to shortness of breath, cough or chest pain. She complains of ongoing constipation. She does not report any blood in her stools. Medical history is significant for atrial fibrillation, incontinence, carpal tunnel syndrome, fatty liver, insomnia, chronic hypoxemic respiratory failure,  left hemiplegia, overactive bladder, AKA of the left lower extremity, hypertension, sleep apnea and stroke. Surgical history consists of hysterectomy, appendectomy, amputation of the left lower extremity as well as revision of that amputation. Family history consists of heart disease, arthritis, myocardial infarction, hyperlipidemia, seizures, hypothyroidism, hypertension and diabetes mellitus. She is a former smoker having quit 15 years ago.  REVIEW OF SYSTEMS:  Review  of Systems  Constitutional:  Positive for fatigue.  HENT:  Negative.    Eyes: Negative.   Respiratory:  Positive for shortness of breath.   Cardiovascular: Negative.   Gastrointestinal:  Positive for constipation.  Genitourinary:  Positive for bladder incontinence.   Musculoskeletal:  Positive for gait problem.  Skin: Negative.   Neurological:  Positive for gait problem and numbness.  Hematological: Negative.   Psychiatric/Behavioral:  Positive for sleep disturbance.      VITALS:  Blood pressure (!) 115/55, pulse (!) 109, temperature 97.9 F (36.6 C), temperature source Oral, resp. rate 16, height 5\' 4"  (1.626 m), weight 198 lb (89.8 kg), SpO2 94%.  Wt Readings from Last 3 Encounters:  12/29/23 198 lb (89.8 kg)  11/18/23 198 lb (89.8 kg)  03/22/23 198 lb (89.8 kg)    Body mass index is 33.99 kg/m.  Performance status (ECOG): 2 - Symptomatic, <50% confined to bed  PHYSICAL EXAM:  Physical Exam Constitutional:      Appearance: Normal appearance. She is normal weight.  HENT:     Head: Normocephalic and atraumatic.     Mouth/Throat:     Mouth: Mucous membranes are moist.  Cardiovascular:     Rate and Rhythm: Normal rate and regular rhythm.  Pulmonary:     Effort: Pulmonary effort is normal.     Breath sounds: Normal breath sounds.  Musculoskeletal:        General: Normal range of motion.     Cervical back: Normal range of motion.     Comments: AKA left lower extremity   Skin:    General: Skin is warm and dry.  Neurological:     General: No focal deficit present.     Mental  Status: She is alert and oriented to person, place, and time. Mental status is at baseline.     Gait: Gait abnormal.  Psychiatric:        Mood and Affect: Mood normal.        Behavior: Behavior normal.        Thought Content: Thought content normal.        Judgment: Judgment normal.      LABS:      Latest Ref Rng & Units 12/29/2023   10:19 AM 11/18/2023    9:49 AM 01/18/2022    1:47 PM   CBC  WBC 4.0 - 10.5 K/uL 7.5  8.5  10.8   Hemoglobin 12.0 - 15.0 g/dL 7.8  7.2  96.2   Hematocrit 36.0 - 46.0 % 28.8  26.5  36.4   Platelets 150 - 400 K/uL 346  334  269       Latest Ref Rng & Units 12/29/2023   10:19 AM 12/27/2020    3:56 AM 12/26/2020    9:58 AM  CMP  Glucose 70 - 99 mg/dL 952  98  841   BUN 8 - 23 mg/dL 11  10  9    Creatinine 0.44 - 1.00 mg/dL 3.24  4.01  0.27   Sodium 135 - 145 mmol/L 142  142  142   Potassium 3.5 - 5.1 mmol/L 3.3  3.7  3.6   Chloride 98 - 111 mmol/L 97  110  106   CO2 22 - 32 mmol/L 36  26  31   Calcium  8.9 - 10.3 mg/dL 9.6  9.8  9.5   Total Protein 6.5 - 8.1 g/dL 7.1     Total Bilirubin 0.0 - 1.2 mg/dL 0.4     Alkaline Phos 38 - 126 U/L 93     AST 15 - 41 U/L 20     ALT 0 - 44 U/L 15        No results found for: "CEA1", "CEA" / No results found for: "CEA1", "CEA" No results found for: "PSA1" No results found for: "OZD664" No results found for: "CAN125"  No results found for: "TOTALPROTELP", "ALBUMINELP", "A1GS", "A2GS", "BETS", "BETA2SER", "GAMS", "MSPIKE", "SPEI" No results found for: "TIBC", "FERRITIN", "IRONPCTSAT" No results found for: "LDH"  STUDIES:  No results found.    HISTORY:   Past Medical History:  Diagnosis Date   A-fib (HCC)    Anemia    Anxiety    on treatment   Arthritis    arms   Asthma    exertional   Chronic lower back pain    Community acquired bacterial pneumonia 07/20/2018   Complex regional pain syndrome of left lower extremity 01/13/2012   Constipation 07/20/2018   Last Assessment & Plan:  Formatting of this note might be different from the original. -continue senokot and miralax  -added suppository and sorbitol today  -order KUB if develops abd pain/n/v   COPD (chronic obstructive pulmonary disease) (HCC)    usesO2 24/7   Depression    DNR (do not resuscitate)    Dysuria-frequency syndrome    Emphysema of lung (HCC)    Fatty liver    GERD (gastroesophageal reflux disease)    Headache(784.0)     intermittent migraines   History of CVA (cerebrovascular accident) 07/18/2018   Last Assessment & Plan:  Formatting of this note might be different from the original. -not on antiplatelet therapy currently - has allergy (urticaria/hives) to aspirin   -would consider starting plavix monotherapy for  secondary stroke prevention after completing DVT prophylaxis  -continue statin   History of encephalopathy    History of home oxygen  therapy 07/18/2018   History of kidney stones    Insomnia    Left acetabular fracture (HCC) 07/18/2018   Last Assessment & Plan:  Formatting of this note might be different from the original. -ortho plans for non operative management -Management per ortho primary team: PT/OT, wound care, weight bearing status and pain control -PT/OT rec SNF vs progression to home with HHPT   Left hemiplegia (HCC)    Left upper quadrant pain 07/18/2018   Last Assessment & Plan:  Formatting of this note might be different from the original. Lipase neg 2/2 8th rib fracture   -lidocaine  patch  -encouraged IS   Leukocytosis 07/18/2018   Last Assessment & Plan:  Formatting of this note is different from the original. Lab Results  Component Value Date   WBC 14.9 (H) 07/20/2018  CXR and UA normal   -likely reactive, may be developing PNA -will start doxycycline -repeat CBC tomorrow   Major depression 07/18/2018   Last Assessment & Plan:  Formatting of this note might be different from the original. 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   Sleep apnea 2010   uses  continous oxygen    Stroke (HCC) 1997    Past Surgical History:  Procedure Laterality Date   ABDOMINAL HYSTERECTOMY  08/30/2001   AMPUTATION  01/12/2012   Procedure: AMPUTATION ABOVE KNEE;  Surgeon: Timothy Ford, MD;  Location: [facility] OR;  Service: Orthopedics;  Laterality: Left;  Left Above Knee Amputation   APPENDECTOMY     BIOPSY THYROID      FRACTURE SURGERY Left    Hip   Morphine  Pump     Morphine  Pump Removed  08/30/2004   ORIF ANKLE FRACTURE  08/30/1993   STUMP REVISION Left 07/19/2015   Procedure: Revision Left Above Knee Amputation;  Surgeon: Timothy Ford, MD;  Location: [facility] OR;  Service: Orthopedics;  Laterality: Left;    Family History  Problem Relation Age of Onset   Diabetes Mother    Hypertension Mother    Heart disease Mother    Arthritis Mother    Hyperlipidemia Mother    Hypothyroidism Mother    Diabetes Father    Hypertension Father    Hyperlipidemia Father    Arthritis Father    Heart attack Father    Heart attack Sister    Seizures Sister    Kidney disease Sister    Heart attack Brother    Arthritis Brother    Anesthesia problems Neg Hx     Social History:  reports that she quit smoking about 15 years ago. Her smoking use included cigarettes. She started smoking about 50 years ago. She has a 70 pack-year smoking history. She has never used smokeless tobacco. She reports that she does not drink alcohol and does not use drugs.The patient is accompanied by daughter today.  Allergies:  Allergies  Allergen Reactions   Aspirin   Hives   Bactrim [Sulfamethoxazole-Trimethoprim] Hives   Nitroglycerin Other (See Comments)    Blood pressure drops dangerously low   Oxycodone -Aspirin  Hives    Takes percocet without problems   Sulfamethoxazole-Trimethoprim Hives   Tape Rash    Adhesive Adhesive    Current Medications: Current Outpatient Medications  Medication Sig Dispense Refill   acetaZOLAMIDE  (DIAMOX ) 500 MG capsule Take 1 capsule (500 mg total) by mouth  daily. 20 capsule 0   albuterol  (PROVENTIL  HFA;VENTOLIN  HFA) 108 (90 BASE) MCG/ACT inhaler Inhale 2 puffs into the lungs every 6 (six) hours as needed for wheezing or shortness of breath. For shortness of breath     alendronate (FOSAMAX) 70 MG tablet Take 70 mg by mouth once a week.     apixaban  (ELIQUIS ) 5 MG TABS tablet Take 1 tablet (5 mg total) by mouth 2 (two) times daily. 180 tablet 1   ARIPiprazole (ABILIFY) 10 MG tablet Take 10 mg by mouth daily.     atorvastatin  (LIPITOR) 80 MG tablet Take 80 mg by mouth daily.     Calcium  Carbonate-Vitamin D (CALTRATE 600+D PO) Take by mouth.     clonazePAM (KLONOPIN) 0.5 MG tablet Take 0.5 mg by mouth daily as needed.     cromolyn (OPTICROM) 4 % ophthalmic solution 4 (four) times daily.     cyclobenzaprine  (FLEXERIL ) 5 MG tablet Take 5 mg by mouth 2 (two) times daily as needed for spasms.     diltiazem  (CARDIZEM  CD) 120 MG 24 hr capsule Take 1 capsule (120 mg total) by mouth daily. 90 capsule 0   dronedarone  (MULTAQ ) 400 MG tablet Take 1 tablet (400 mg total) by mouth 2 (two) times daily. 180 tablet 2   ferrous sulfate 325 (65 FE) MG EC tablet Take 1 tablet by mouth daily.     FLUoxetine (PROZAC) 20 MG capsule Take 20 mg by mouth every morning.     fluticasone  (FLONASE ) 50 MCG/ACT nasal spray Place 2 sprays into both nostrils daily.     furosemide  (LASIX ) 40 MG tablet Take 40 mg by mouth daily.     gabapentin  (NEURONTIN ) 400 MG capsule Take 400 mg by mouth 3 (three) times daily.     lidocaine  (LIDODERM ) 5 % Place onto the skin daily.     loratadine (CLARITIN) 10 MG tablet Take 10 mg by mouth daily.     melatonin 3 MG TABS tablet Take 3 mg by mouth at bedtime.     MYRBETRIQ 50 MG TB24 tablet Take 50 mg by mouth daily.     OHTUVAYRE  3 MG/2.5ML SUSP      OXYGEN  Inhale 2 L into the lungs.     pantoprazole  (PROTONIX ) 40 MG tablet Take 40 mg by mouth daily.     Polyethylene Glycol 3350  (MIRALAX  PO) Take by mouth.     potassium chloride (MICRO-K) 10 MEQ  CR capsule Take 10 mEq by mouth daily.     tamsulosin (FLOMAX) 0.4 MG CAPS capsule Take 0.4 mg by mouth daily.     traMADol  (ULTRAM ) 50 MG tablet Take 50 mg by mouth 3 (three) times daily as needed.     traZODone (DESYREL) 100 MG tablet Take 100 mg by mouth at bedtime.     TRELEGY ELLIPTA 100-62.5-25 MCG/INH AEPB Inhale 1 puff into the lungs daily.     No current facility-administered medications for this visit.     ASSESSMENT & PLAN:   Assessment:  Amanda Lewis is a 68 y.o. female with history of iron  deficiency anemia. She has been on oral iron for several years and iron injections prior to that. Most recently she has required blood transfusions due to her anemia with the most recent being 03/21. Today, CBC reveals hemoglobin 7.8. She does complain of increasing fatigue and is very pale in appearance. We discussed blood transfusion tomorrow and possible IV iron once her iron studies have resulted. She has no overt signs of bleeding. She is scheduled for evaluation with GI later this month. CMP is unremarkable today other than a slightly decreased potassium for which she states she was recently started on potassium.   Plan: 1.  Will transfuse 1 unit of PRBC tomorrow. We will plan for IV iron next week if indicated. She will return to clinic 4 weeks after last iron infusion for repeat evaluation.   I discussed the assessment and treatment plan with the patient.  The patient was provided an opportunity to ask questions and all were answered.  The patient agreed with the plan and demonstrated an understanding of the instructions.    Thank you for the referral    60 minutes was spent in patient care.  This included time spent preparing to see the patient (e.g., review of tests), obtaining and/or reviewing separately obtained history, counseling and educating the patient/family/caregiver, ordering medications, tests, or procedures; documenting clinical information in the electronic or other health  record, independently interpreting results and communicating results to the patient/family/caregiver as well as coordination of care.      Adelaide Adjutant, NP   Family Nurse Practitioner - Board Certified [facility] [phone]

## 2023-12-30 ENCOUNTER — Inpatient Hospital Stay

## 2023-12-30 ENCOUNTER — Encounter: Payer: Self-pay | Admitting: Hematology and Oncology

## 2023-12-30 DIAGNOSIS — Z87891 Personal history of nicotine dependence: Secondary | ICD-10-CM | POA: Diagnosis not present

## 2023-12-30 DIAGNOSIS — D509 Iron deficiency anemia, unspecified: Secondary | ICD-10-CM

## 2023-12-30 MED ORDER — SODIUM CHLORIDE 0.9% FLUSH: 10.0000 mL | INTRAVENOUS | Status: DC | PRN

## 2023-12-30 MED ORDER — DIPHENHYDRAMINE HCL 25 MG PO CAPS
25.0000 mg | ORAL_CAPSULE | Freq: Once | ORAL | Status: AC
Start: 2023-12-30 — End: 2023-12-30
  Administered 2023-12-30: 25 mg via ORAL
  Filled 2023-12-30: qty 1

## 2023-12-30 MED ORDER — SODIUM CHLORIDE 0.9% IV SOLUTION: 250.0000 mL | INTRAVENOUS | Status: DC

## 2023-12-30 MED ORDER — ACETAMINOPHEN 325 MG PO TABS
650.0000 mg | ORAL_TABLET | Freq: Once | ORAL | Status: AC
Start: 2023-12-30 — End: 2023-12-30
  Administered 2023-12-30: 650 mg via ORAL
  Filled 2023-12-30: qty 2

## 2023-12-30 NOTE — Patient Instructions (Signed)

## 2024-01-02 ENCOUNTER — Telehealth: Payer: Self-pay | Admitting: Hematology and Oncology

## 2024-01-02 DIAGNOSIS — M810 Age-related osteoporosis without current pathological fracture: Secondary | ICD-10-CM | POA: Diagnosis not present

## 2024-01-02 DIAGNOSIS — R739 Hyperglycemia, unspecified: Secondary | ICD-10-CM | POA: Diagnosis not present

## 2024-01-02 DIAGNOSIS — E041 Nontoxic single thyroid nodule: Secondary | ICD-10-CM | POA: Diagnosis not present

## 2024-01-02 DIAGNOSIS — J4489 Other specified chronic obstructive pulmonary disease: Secondary | ICD-10-CM | POA: Diagnosis not present

## 2024-01-02 DIAGNOSIS — Z7901 Long term (current) use of anticoagulants: Secondary | ICD-10-CM | POA: Diagnosis not present

## 2024-01-02 DIAGNOSIS — G5600 Carpal tunnel syndrome, unspecified upper limb: Secondary | ICD-10-CM | POA: Diagnosis not present

## 2024-01-02 DIAGNOSIS — G4733 Obstructive sleep apnea (adult) (pediatric): Secondary | ICD-10-CM | POA: Diagnosis not present

## 2024-01-02 DIAGNOSIS — K760 Fatty (change of) liver, not elsewhere classified: Secondary | ICD-10-CM | POA: Diagnosis not present

## 2024-01-02 DIAGNOSIS — J9611 Chronic respiratory failure with hypoxia: Secondary | ICD-10-CM | POA: Diagnosis not present

## 2024-01-02 DIAGNOSIS — K219 Gastro-esophageal reflux disease without esophagitis: Secondary | ICD-10-CM | POA: Diagnosis not present

## 2024-01-02 DIAGNOSIS — D509 Iron deficiency anemia, unspecified: Secondary | ICD-10-CM | POA: Diagnosis not present

## 2024-01-02 DIAGNOSIS — I051 Rheumatic mitral insufficiency: Secondary | ICD-10-CM | POA: Diagnosis not present

## 2024-01-02 DIAGNOSIS — I700 Atherosclerosis of aorta: Secondary | ICD-10-CM | POA: Diagnosis not present

## 2024-01-02 DIAGNOSIS — I110 Hypertensive heart disease with heart failure: Secondary | ICD-10-CM | POA: Diagnosis not present

## 2024-01-02 DIAGNOSIS — N3281 Overactive bladder: Secondary | ICD-10-CM | POA: Diagnosis not present

## 2024-01-02 DIAGNOSIS — I69354 Hemiplegia and hemiparesis following cerebral infarction affecting left non-dominant side: Secondary | ICD-10-CM | POA: Diagnosis not present

## 2024-01-02 DIAGNOSIS — I509 Heart failure, unspecified: Secondary | ICD-10-CM | POA: Diagnosis not present

## 2024-01-02 DIAGNOSIS — G4700 Insomnia, unspecified: Secondary | ICD-10-CM | POA: Diagnosis not present

## 2024-01-02 DIAGNOSIS — R911 Solitary pulmonary nodule: Secondary | ICD-10-CM | POA: Diagnosis not present

## 2024-01-02 DIAGNOSIS — G629 Polyneuropathy, unspecified: Secondary | ICD-10-CM | POA: Diagnosis not present

## 2024-01-02 DIAGNOSIS — I4891 Unspecified atrial fibrillation: Secondary | ICD-10-CM | POA: Diagnosis not present

## 2024-01-02 DIAGNOSIS — E7800 Pure hypercholesterolemia, unspecified: Secondary | ICD-10-CM | POA: Diagnosis not present

## 2024-01-02 DIAGNOSIS — M1990 Unspecified osteoarthritis, unspecified site: Secondary | ICD-10-CM | POA: Diagnosis not present

## 2024-01-02 LAB — TYPE AND SCREEN
ABO/RH(D): O NEG
Antibody Screen: NEGATIVE
Unit division: 0

## 2024-01-02 LAB — BPAM RBC
Blood Product Expiration Date: 202505222359
ISSUE DATE / TIME: 202505020913
Unit Type and Rh: 9500

## 2024-01-02 NOTE — Telephone Encounter (Signed)
 Patient has been scheduled. Aware of appt date and time.     Message Received: 4 days ago Adelaide Adjutant, NP  P [facility] Scheduling I have added IV iron for this patient. She will need to follow up 4 weeks after last infusion.  Thank you!

## 2024-01-04 ENCOUNTER — Inpatient Hospital Stay

## 2024-01-04 VITALS — BP 123/61 | HR 87 | Temp 98.3°F | Resp 22

## 2024-01-04 DIAGNOSIS — D509 Iron deficiency anemia, unspecified: Secondary | ICD-10-CM | POA: Diagnosis not present

## 2024-01-04 DIAGNOSIS — Z87891 Personal history of nicotine dependence: Secondary | ICD-10-CM | POA: Diagnosis not present

## 2024-01-04 MED ORDER — IRON SUCROSE 20 MG/ML IV SOLN
200.0000 mg | Freq: Once | INTRAVENOUS | Status: AC
  Administered 2024-01-04: 200 mg via INTRAVENOUS
  Filled 2024-01-04: qty 10

## 2024-01-04 MED ORDER — ACETAMINOPHEN 325 MG PO TABS
650.0000 mg | ORAL_TABLET | Freq: Once | ORAL | Status: AC
  Administered 2024-01-04: 650 mg via ORAL
  Filled 2024-01-04: qty 2

## 2024-01-04 MED ORDER — SODIUM CHLORIDE 0.9 % IV SOLN: INTRAVENOUS | Status: DC

## 2024-01-04 MED ORDER — LORATADINE 10 MG PO TABS
10.0000 mg | ORAL_TABLET | Freq: Once | ORAL | Status: AC
  Administered 2024-01-04: 10 mg via ORAL
  Filled 2024-01-04: qty 1

## 2024-01-04 MED ORDER — SODIUM CHLORIDE 0.9% FLUSH
10.0000 mL | Freq: Once | INTRAVENOUS | Status: AC | PRN
Start: 2024-01-04 — End: 2024-01-04
  Administered 2024-01-04: 10 mL

## 2024-01-04 NOTE — Patient Instructions (Signed)

## 2024-01-05 ENCOUNTER — Inpatient Hospital Stay

## 2024-01-05 ENCOUNTER — Encounter: Payer: Self-pay | Admitting: Hematology and Oncology

## 2024-01-05 VITALS — BP 129/55 | HR 75 | Temp 98.7°F | Resp 18

## 2024-01-05 DIAGNOSIS — I4891 Unspecified atrial fibrillation: Secondary | ICD-10-CM | POA: Diagnosis not present

## 2024-01-05 DIAGNOSIS — Z87891 Personal history of nicotine dependence: Secondary | ICD-10-CM | POA: Diagnosis not present

## 2024-01-05 DIAGNOSIS — D509 Iron deficiency anemia, unspecified: Secondary | ICD-10-CM | POA: Diagnosis not present

## 2024-01-05 DIAGNOSIS — I509 Heart failure, unspecified: Secondary | ICD-10-CM | POA: Diagnosis not present

## 2024-01-05 DIAGNOSIS — K219 Gastro-esophageal reflux disease without esophagitis: Secondary | ICD-10-CM | POA: Diagnosis not present

## 2024-01-05 DIAGNOSIS — G629 Polyneuropathy, unspecified: Secondary | ICD-10-CM | POA: Diagnosis not present

## 2024-01-05 DIAGNOSIS — I051 Rheumatic mitral insufficiency: Secondary | ICD-10-CM | POA: Diagnosis not present

## 2024-01-05 DIAGNOSIS — N3281 Overactive bladder: Secondary | ICD-10-CM | POA: Diagnosis not present

## 2024-01-05 DIAGNOSIS — J9611 Chronic respiratory failure with hypoxia: Secondary | ICD-10-CM | POA: Diagnosis not present

## 2024-01-05 DIAGNOSIS — G4733 Obstructive sleep apnea (adult) (pediatric): Secondary | ICD-10-CM | POA: Diagnosis not present

## 2024-01-05 DIAGNOSIS — R739 Hyperglycemia, unspecified: Secondary | ICD-10-CM | POA: Diagnosis not present

## 2024-01-05 DIAGNOSIS — R911 Solitary pulmonary nodule: Secondary | ICD-10-CM | POA: Diagnosis not present

## 2024-01-05 DIAGNOSIS — G5600 Carpal tunnel syndrome, unspecified upper limb: Secondary | ICD-10-CM | POA: Diagnosis not present

## 2024-01-05 DIAGNOSIS — I110 Hypertensive heart disease with heart failure: Secondary | ICD-10-CM | POA: Diagnosis not present

## 2024-01-05 DIAGNOSIS — E041 Nontoxic single thyroid nodule: Secondary | ICD-10-CM | POA: Diagnosis not present

## 2024-01-05 DIAGNOSIS — J4489 Other specified chronic obstructive pulmonary disease: Secondary | ICD-10-CM | POA: Diagnosis not present

## 2024-01-05 DIAGNOSIS — I700 Atherosclerosis of aorta: Secondary | ICD-10-CM | POA: Diagnosis not present

## 2024-01-05 DIAGNOSIS — E7800 Pure hypercholesterolemia, unspecified: Secondary | ICD-10-CM | POA: Diagnosis not present

## 2024-01-05 DIAGNOSIS — I69354 Hemiplegia and hemiparesis following cerebral infarction affecting left non-dominant side: Secondary | ICD-10-CM | POA: Diagnosis not present

## 2024-01-05 DIAGNOSIS — M810 Age-related osteoporosis without current pathological fracture: Secondary | ICD-10-CM | POA: Diagnosis not present

## 2024-01-05 DIAGNOSIS — Z7901 Long term (current) use of anticoagulants: Secondary | ICD-10-CM | POA: Diagnosis not present

## 2024-01-05 DIAGNOSIS — G4700 Insomnia, unspecified: Secondary | ICD-10-CM | POA: Diagnosis not present

## 2024-01-05 DIAGNOSIS — K760 Fatty (change of) liver, not elsewhere classified: Secondary | ICD-10-CM | POA: Diagnosis not present

## 2024-01-05 DIAGNOSIS — M1990 Unspecified osteoarthritis, unspecified site: Secondary | ICD-10-CM | POA: Diagnosis not present

## 2024-01-05 MED ORDER — SODIUM CHLORIDE 0.9% FLUSH
10.0000 mL | Freq: Once | INTRAVENOUS | Status: AC | PRN
  Administered 2024-01-05: 10 mL

## 2024-01-05 MED ORDER — IRON SUCROSE 20 MG/ML IV SOLN
200.0000 mg | Freq: Once | INTRAVENOUS | Status: AC
  Administered 2024-01-05: 200 mg via INTRAVENOUS
  Filled 2024-01-05: qty 10

## 2024-01-05 MED ORDER — LORATADINE 10 MG PO TABS
10.0000 mg | ORAL_TABLET | Freq: Once | ORAL | Status: AC
  Administered 2024-01-05: 10 mg via ORAL
  Filled 2024-01-05: qty 1

## 2024-01-05 MED ORDER — ACETAMINOPHEN 325 MG PO TABS
650.0000 mg | ORAL_TABLET | Freq: Once | ORAL | Status: AC
  Administered 2024-01-05: 650 mg via ORAL
  Filled 2024-01-05: qty 2

## 2024-01-05 NOTE — Patient Instructions (Signed)

## 2024-01-06 DIAGNOSIS — G4733 Obstructive sleep apnea (adult) (pediatric): Secondary | ICD-10-CM | POA: Diagnosis not present

## 2024-01-09 DIAGNOSIS — N3281 Overactive bladder: Secondary | ICD-10-CM | POA: Diagnosis not present

## 2024-01-09 DIAGNOSIS — I110 Hypertensive heart disease with heart failure: Secondary | ICD-10-CM | POA: Diagnosis not present

## 2024-01-09 DIAGNOSIS — E041 Nontoxic single thyroid nodule: Secondary | ICD-10-CM | POA: Diagnosis not present

## 2024-01-09 DIAGNOSIS — I051 Rheumatic mitral insufficiency: Secondary | ICD-10-CM | POA: Diagnosis not present

## 2024-01-09 DIAGNOSIS — K760 Fatty (change of) liver, not elsewhere classified: Secondary | ICD-10-CM | POA: Diagnosis not present

## 2024-01-09 DIAGNOSIS — K219 Gastro-esophageal reflux disease without esophagitis: Secondary | ICD-10-CM | POA: Diagnosis not present

## 2024-01-09 DIAGNOSIS — Z7901 Long term (current) use of anticoagulants: Secondary | ICD-10-CM | POA: Diagnosis not present

## 2024-01-09 DIAGNOSIS — M1990 Unspecified osteoarthritis, unspecified site: Secondary | ICD-10-CM | POA: Diagnosis not present

## 2024-01-09 DIAGNOSIS — R739 Hyperglycemia, unspecified: Secondary | ICD-10-CM | POA: Diagnosis not present

## 2024-01-09 DIAGNOSIS — R911 Solitary pulmonary nodule: Secondary | ICD-10-CM | POA: Diagnosis not present

## 2024-01-09 DIAGNOSIS — G5600 Carpal tunnel syndrome, unspecified upper limb: Secondary | ICD-10-CM | POA: Diagnosis not present

## 2024-01-09 DIAGNOSIS — D509 Iron deficiency anemia, unspecified: Secondary | ICD-10-CM | POA: Diagnosis not present

## 2024-01-09 DIAGNOSIS — G4733 Obstructive sleep apnea (adult) (pediatric): Secondary | ICD-10-CM | POA: Diagnosis not present

## 2024-01-09 DIAGNOSIS — G629 Polyneuropathy, unspecified: Secondary | ICD-10-CM | POA: Diagnosis not present

## 2024-01-09 DIAGNOSIS — J4489 Other specified chronic obstructive pulmonary disease: Secondary | ICD-10-CM | POA: Diagnosis not present

## 2024-01-09 DIAGNOSIS — I69354 Hemiplegia and hemiparesis following cerebral infarction affecting left non-dominant side: Secondary | ICD-10-CM | POA: Diagnosis not present

## 2024-01-09 DIAGNOSIS — I509 Heart failure, unspecified: Secondary | ICD-10-CM | POA: Diagnosis not present

## 2024-01-09 DIAGNOSIS — I4891 Unspecified atrial fibrillation: Secondary | ICD-10-CM | POA: Diagnosis not present

## 2024-01-09 DIAGNOSIS — G4700 Insomnia, unspecified: Secondary | ICD-10-CM | POA: Diagnosis not present

## 2024-01-09 DIAGNOSIS — E7800 Pure hypercholesterolemia, unspecified: Secondary | ICD-10-CM | POA: Diagnosis not present

## 2024-01-09 DIAGNOSIS — J9611 Chronic respiratory failure with hypoxia: Secondary | ICD-10-CM | POA: Diagnosis not present

## 2024-01-09 DIAGNOSIS — M810 Age-related osteoporosis without current pathological fracture: Secondary | ICD-10-CM | POA: Diagnosis not present

## 2024-01-09 DIAGNOSIS — I700 Atherosclerosis of aorta: Secondary | ICD-10-CM | POA: Diagnosis not present

## 2024-01-10 ENCOUNTER — Inpatient Hospital Stay

## 2024-01-10 VITALS — BP 122/67 | HR 80 | Temp 98.0°F | Resp 18

## 2024-01-10 DIAGNOSIS — Z87891 Personal history of nicotine dependence: Secondary | ICD-10-CM | POA: Diagnosis not present

## 2024-01-10 DIAGNOSIS — D509 Iron deficiency anemia, unspecified: Secondary | ICD-10-CM

## 2024-01-10 MED ORDER — LORATADINE 10 MG PO TABS
10.0000 mg | ORAL_TABLET | Freq: Once | ORAL | Status: AC
  Administered 2024-01-10: 10 mg via ORAL
  Filled 2024-01-10: qty 1

## 2024-01-10 MED ORDER — ACETAMINOPHEN 325 MG PO TABS
650.0000 mg | ORAL_TABLET | Freq: Once | ORAL | Status: AC
  Administered 2024-01-10: 650 mg via ORAL
  Filled 2024-01-10: qty 2

## 2024-01-10 MED ORDER — IRON SUCROSE 20 MG/ML IV SOLN
200.0000 mg | Freq: Once | INTRAVENOUS | Status: AC
  Administered 2024-01-10: 200 mg via INTRAVENOUS
  Filled 2024-01-10: qty 10

## 2024-01-10 MED ORDER — SODIUM CHLORIDE 0.9% FLUSH
10.0000 mL | Freq: Once | INTRAVENOUS | Status: AC | PRN
  Administered 2024-01-10: 10 mL

## 2024-01-10 NOTE — Patient Instructions (Signed)

## 2024-01-11 ENCOUNTER — Inpatient Hospital Stay

## 2024-01-11 VITALS — BP 141/77 | HR 78 | Temp 98.0°F | Resp 18

## 2024-01-11 DIAGNOSIS — D509 Iron deficiency anemia, unspecified: Secondary | ICD-10-CM | POA: Diagnosis not present

## 2024-01-11 DIAGNOSIS — Z87891 Personal history of nicotine dependence: Secondary | ICD-10-CM | POA: Diagnosis not present

## 2024-01-11 MED ORDER — LORATADINE 10 MG PO TABS
10.0000 mg | ORAL_TABLET | Freq: Once | ORAL | Status: AC
  Administered 2024-01-11: 10 mg via ORAL
  Filled 2024-01-11: qty 1

## 2024-01-11 MED ORDER — IRON SUCROSE 20 MG/ML IV SOLN
200.0000 mg | Freq: Once | INTRAVENOUS | Status: AC
  Administered 2024-01-11: 200 mg via INTRAVENOUS
  Filled 2024-01-11: qty 10

## 2024-01-11 MED ORDER — SODIUM CHLORIDE 0.9% FLUSH
10.0000 mL | Freq: Once | INTRAVENOUS | Status: AC | PRN
  Administered 2024-01-11: 10 mL

## 2024-01-11 MED ORDER — ACETAMINOPHEN 325 MG PO TABS
650.0000 mg | ORAL_TABLET | Freq: Once | ORAL | Status: AC
  Administered 2024-01-11: 650 mg via ORAL
  Filled 2024-01-11: qty 2

## 2024-01-12 ENCOUNTER — Other Ambulatory Visit: Payer: Self-pay | Admitting: Cardiology

## 2024-01-12 ENCOUNTER — Inpatient Hospital Stay

## 2024-01-12 VITALS — BP 113/51 | HR 77 | Temp 99.2°F | Resp 20

## 2024-01-12 DIAGNOSIS — Z87891 Personal history of nicotine dependence: Secondary | ICD-10-CM | POA: Diagnosis not present

## 2024-01-12 DIAGNOSIS — I480 Paroxysmal atrial fibrillation: Secondary | ICD-10-CM

## 2024-01-12 DIAGNOSIS — D509 Iron deficiency anemia, unspecified: Secondary | ICD-10-CM | POA: Diagnosis not present

## 2024-01-12 MED ORDER — SODIUM CHLORIDE 0.9% FLUSH: 10.0000 mL | Freq: Once | INTRAVENOUS | Status: DC | PRN

## 2024-01-12 MED ORDER — LORATADINE 10 MG PO TABS
10.0000 mg | ORAL_TABLET | Freq: Once | ORAL | Status: AC
  Administered 2024-01-12: 10 mg via ORAL
  Filled 2024-01-12: qty 1

## 2024-01-12 MED ORDER — ACETAMINOPHEN 325 MG PO TABS
650.0000 mg | ORAL_TABLET | Freq: Once | ORAL | Status: AC
  Administered 2024-01-12: 650 mg via ORAL
  Filled 2024-01-12: qty 2

## 2024-01-12 MED ORDER — IRON SUCROSE 20 MG/ML IV SOLN
200.0000 mg | Freq: Once | INTRAVENOUS | Status: AC
  Administered 2024-01-12: 200 mg via INTRAVENOUS
  Filled 2024-01-12: qty 10

## 2024-01-12 NOTE — Telephone Encounter (Signed)
 Prescription refill request for Eliquis  received. Indication: afib  Last office visit: Amanda Lewis, 11/18/2023 Scr: 0.62, 12/29/2023 Age: 68 yo  Weight: 89.8 kg   Refill sent.

## 2024-01-12 NOTE — Patient Instructions (Signed)

## 2024-01-13 DIAGNOSIS — I110 Hypertensive heart disease with heart failure: Secondary | ICD-10-CM | POA: Diagnosis not present

## 2024-01-13 DIAGNOSIS — G5600 Carpal tunnel syndrome, unspecified upper limb: Secondary | ICD-10-CM | POA: Diagnosis not present

## 2024-01-13 DIAGNOSIS — I69354 Hemiplegia and hemiparesis following cerebral infarction affecting left non-dominant side: Secondary | ICD-10-CM | POA: Diagnosis not present

## 2024-01-13 DIAGNOSIS — I051 Rheumatic mitral insufficiency: Secondary | ICD-10-CM | POA: Diagnosis not present

## 2024-01-13 DIAGNOSIS — D509 Iron deficiency anemia, unspecified: Secondary | ICD-10-CM | POA: Diagnosis not present

## 2024-01-13 DIAGNOSIS — I509 Heart failure, unspecified: Secondary | ICD-10-CM | POA: Diagnosis not present

## 2024-01-13 DIAGNOSIS — Z7901 Long term (current) use of anticoagulants: Secondary | ICD-10-CM | POA: Diagnosis not present

## 2024-01-13 DIAGNOSIS — Z1231 Encounter for screening mammogram for malignant neoplasm of breast: Secondary | ICD-10-CM | POA: Diagnosis not present

## 2024-01-13 DIAGNOSIS — E7800 Pure hypercholesterolemia, unspecified: Secondary | ICD-10-CM | POA: Diagnosis not present

## 2024-01-13 DIAGNOSIS — G629 Polyneuropathy, unspecified: Secondary | ICD-10-CM | POA: Diagnosis not present

## 2024-01-13 DIAGNOSIS — M1990 Unspecified osteoarthritis, unspecified site: Secondary | ICD-10-CM | POA: Diagnosis not present

## 2024-01-13 DIAGNOSIS — J4489 Other specified chronic obstructive pulmonary disease: Secondary | ICD-10-CM | POA: Diagnosis not present

## 2024-01-13 DIAGNOSIS — R739 Hyperglycemia, unspecified: Secondary | ICD-10-CM | POA: Diagnosis not present

## 2024-01-13 DIAGNOSIS — I4891 Unspecified atrial fibrillation: Secondary | ICD-10-CM | POA: Diagnosis not present

## 2024-01-13 DIAGNOSIS — G4700 Insomnia, unspecified: Secondary | ICD-10-CM | POA: Diagnosis not present

## 2024-01-13 DIAGNOSIS — M810 Age-related osteoporosis without current pathological fracture: Secondary | ICD-10-CM | POA: Diagnosis not present

## 2024-01-13 DIAGNOSIS — G4733 Obstructive sleep apnea (adult) (pediatric): Secondary | ICD-10-CM | POA: Diagnosis not present

## 2024-01-13 DIAGNOSIS — K760 Fatty (change of) liver, not elsewhere classified: Secondary | ICD-10-CM | POA: Diagnosis not present

## 2024-01-13 DIAGNOSIS — J9611 Chronic respiratory failure with hypoxia: Secondary | ICD-10-CM | POA: Diagnosis not present

## 2024-01-13 DIAGNOSIS — R911 Solitary pulmonary nodule: Secondary | ICD-10-CM | POA: Diagnosis not present

## 2024-01-13 DIAGNOSIS — E041 Nontoxic single thyroid nodule: Secondary | ICD-10-CM | POA: Diagnosis not present

## 2024-01-13 DIAGNOSIS — K219 Gastro-esophageal reflux disease without esophagitis: Secondary | ICD-10-CM | POA: Diagnosis not present

## 2024-01-13 DIAGNOSIS — N3281 Overactive bladder: Secondary | ICD-10-CM | POA: Diagnosis not present

## 2024-01-13 DIAGNOSIS — I700 Atherosclerosis of aorta: Secondary | ICD-10-CM | POA: Diagnosis not present

## 2024-01-17 DIAGNOSIS — M810 Age-related osteoporosis without current pathological fracture: Secondary | ICD-10-CM | POA: Diagnosis not present

## 2024-01-17 DIAGNOSIS — I110 Hypertensive heart disease with heart failure: Secondary | ICD-10-CM | POA: Diagnosis not present

## 2024-01-17 DIAGNOSIS — E7800 Pure hypercholesterolemia, unspecified: Secondary | ICD-10-CM | POA: Diagnosis not present

## 2024-01-17 DIAGNOSIS — G5600 Carpal tunnel syndrome, unspecified upper limb: Secondary | ICD-10-CM | POA: Diagnosis not present

## 2024-01-17 DIAGNOSIS — G4700 Insomnia, unspecified: Secondary | ICD-10-CM | POA: Diagnosis not present

## 2024-01-17 DIAGNOSIS — R911 Solitary pulmonary nodule: Secondary | ICD-10-CM | POA: Diagnosis not present

## 2024-01-17 DIAGNOSIS — R739 Hyperglycemia, unspecified: Secondary | ICD-10-CM | POA: Diagnosis not present

## 2024-01-17 DIAGNOSIS — K760 Fatty (change of) liver, not elsewhere classified: Secondary | ICD-10-CM | POA: Diagnosis not present

## 2024-01-17 DIAGNOSIS — I4891 Unspecified atrial fibrillation: Secondary | ICD-10-CM | POA: Diagnosis not present

## 2024-01-17 DIAGNOSIS — I700 Atherosclerosis of aorta: Secondary | ICD-10-CM | POA: Diagnosis not present

## 2024-01-17 DIAGNOSIS — Z7901 Long term (current) use of anticoagulants: Secondary | ICD-10-CM | POA: Diagnosis not present

## 2024-01-17 DIAGNOSIS — E041 Nontoxic single thyroid nodule: Secondary | ICD-10-CM | POA: Diagnosis not present

## 2024-01-17 DIAGNOSIS — J4489 Other specified chronic obstructive pulmonary disease: Secondary | ICD-10-CM | POA: Diagnosis not present

## 2024-01-17 DIAGNOSIS — M1990 Unspecified osteoarthritis, unspecified site: Secondary | ICD-10-CM | POA: Diagnosis not present

## 2024-01-17 DIAGNOSIS — I509 Heart failure, unspecified: Secondary | ICD-10-CM | POA: Diagnosis not present

## 2024-01-17 DIAGNOSIS — G4733 Obstructive sleep apnea (adult) (pediatric): Secondary | ICD-10-CM | POA: Diagnosis not present

## 2024-01-17 DIAGNOSIS — D509 Iron deficiency anemia, unspecified: Secondary | ICD-10-CM | POA: Diagnosis not present

## 2024-01-17 DIAGNOSIS — I69354 Hemiplegia and hemiparesis following cerebral infarction affecting left non-dominant side: Secondary | ICD-10-CM | POA: Diagnosis not present

## 2024-01-17 DIAGNOSIS — J9611 Chronic respiratory failure with hypoxia: Secondary | ICD-10-CM | POA: Diagnosis not present

## 2024-01-17 DIAGNOSIS — G629 Polyneuropathy, unspecified: Secondary | ICD-10-CM | POA: Diagnosis not present

## 2024-01-17 DIAGNOSIS — K219 Gastro-esophageal reflux disease without esophagitis: Secondary | ICD-10-CM | POA: Diagnosis not present

## 2024-01-17 DIAGNOSIS — N3281 Overactive bladder: Secondary | ICD-10-CM | POA: Diagnosis not present

## 2024-01-17 DIAGNOSIS — I051 Rheumatic mitral insufficiency: Secondary | ICD-10-CM | POA: Diagnosis not present

## 2024-01-18 DIAGNOSIS — G629 Polyneuropathy, unspecified: Secondary | ICD-10-CM | POA: Diagnosis not present

## 2024-01-18 DIAGNOSIS — E7800 Pure hypercholesterolemia, unspecified: Secondary | ICD-10-CM | POA: Diagnosis not present

## 2024-01-18 DIAGNOSIS — N3281 Overactive bladder: Secondary | ICD-10-CM | POA: Diagnosis not present

## 2024-01-18 DIAGNOSIS — I509 Heart failure, unspecified: Secondary | ICD-10-CM | POA: Diagnosis not present

## 2024-01-18 DIAGNOSIS — K219 Gastro-esophageal reflux disease without esophagitis: Secondary | ICD-10-CM | POA: Diagnosis not present

## 2024-01-18 DIAGNOSIS — G4733 Obstructive sleep apnea (adult) (pediatric): Secondary | ICD-10-CM | POA: Diagnosis not present

## 2024-01-18 DIAGNOSIS — Z7901 Long term (current) use of anticoagulants: Secondary | ICD-10-CM | POA: Diagnosis not present

## 2024-01-18 DIAGNOSIS — J4489 Other specified chronic obstructive pulmonary disease: Secondary | ICD-10-CM | POA: Diagnosis not present

## 2024-01-18 DIAGNOSIS — R739 Hyperglycemia, unspecified: Secondary | ICD-10-CM | POA: Diagnosis not present

## 2024-01-18 DIAGNOSIS — J9611 Chronic respiratory failure with hypoxia: Secondary | ICD-10-CM | POA: Diagnosis not present

## 2024-01-18 DIAGNOSIS — R911 Solitary pulmonary nodule: Secondary | ICD-10-CM | POA: Diagnosis not present

## 2024-01-18 DIAGNOSIS — D509 Iron deficiency anemia, unspecified: Secondary | ICD-10-CM | POA: Diagnosis not present

## 2024-01-18 DIAGNOSIS — I051 Rheumatic mitral insufficiency: Secondary | ICD-10-CM | POA: Diagnosis not present

## 2024-01-18 DIAGNOSIS — I69354 Hemiplegia and hemiparesis following cerebral infarction affecting left non-dominant side: Secondary | ICD-10-CM | POA: Diagnosis not present

## 2024-01-18 DIAGNOSIS — K760 Fatty (change of) liver, not elsewhere classified: Secondary | ICD-10-CM | POA: Diagnosis not present

## 2024-01-18 DIAGNOSIS — I4891 Unspecified atrial fibrillation: Secondary | ICD-10-CM | POA: Diagnosis not present

## 2024-01-18 DIAGNOSIS — E041 Nontoxic single thyroid nodule: Secondary | ICD-10-CM | POA: Diagnosis not present

## 2024-01-18 DIAGNOSIS — I700 Atherosclerosis of aorta: Secondary | ICD-10-CM | POA: Diagnosis not present

## 2024-01-18 DIAGNOSIS — I110 Hypertensive heart disease with heart failure: Secondary | ICD-10-CM | POA: Diagnosis not present

## 2024-01-18 DIAGNOSIS — G5600 Carpal tunnel syndrome, unspecified upper limb: Secondary | ICD-10-CM | POA: Diagnosis not present

## 2024-01-18 DIAGNOSIS — M1990 Unspecified osteoarthritis, unspecified site: Secondary | ICD-10-CM | POA: Diagnosis not present

## 2024-01-18 DIAGNOSIS — G4700 Insomnia, unspecified: Secondary | ICD-10-CM | POA: Diagnosis not present

## 2024-01-18 DIAGNOSIS — M810 Age-related osteoporosis without current pathological fracture: Secondary | ICD-10-CM | POA: Diagnosis not present

## 2024-01-19 DIAGNOSIS — D649 Anemia, unspecified: Secondary | ICD-10-CM | POA: Diagnosis not present

## 2024-01-19 DIAGNOSIS — I509 Heart failure, unspecified: Secondary | ICD-10-CM | POA: Diagnosis not present

## 2024-01-19 DIAGNOSIS — K219 Gastro-esophageal reflux disease without esophagitis: Secondary | ICD-10-CM | POA: Diagnosis not present

## 2024-01-19 DIAGNOSIS — K5900 Constipation, unspecified: Secondary | ICD-10-CM | POA: Diagnosis not present

## 2024-01-20 ENCOUNTER — Telehealth: Payer: Self-pay

## 2024-01-20 NOTE — Telephone Encounter (Signed)
Pharmacy please advise on holding Eliquis prior to colonoscopy and EGD scheduled for TBD. Thank you.   

## 2024-01-20 NOTE — Telephone Encounter (Signed)
   Pre-operative Risk Assessment    Patient Name: Amanda Lewis  DOB: 1955-12-11 MRN: [id number]   Date of last office visit: 11/18/2023 Date of next office visit: N/A   Request for Surgical Clearance    Procedure:  Colonoscopy and EGD  Date of Surgery:  Clearance TBD                              Surgeon:  Micki Alas Surgeon's Group or Practice Name:  [facility] Phone number:  [phone] Fax number:  [phone]   Type of Clearance Requested:   - Pharmacy:  Hold Apixaban  (Eliquis ) 2 days prior   Type of Anesthesia:  Not Indicated   Additional requests/questions:    Signed, Melea Prezioso M Tsuneo Faison   01/20/2024, 7:32 AM

## 2024-01-22 NOTE — Telephone Encounter (Signed)
 Patient with diagnosis of atrial fibrillation on Eliquis  for anticoagulation.    Procedure:  Colonoscopy and EGD   Date of Surgery:  Clearance TBD    CHA2DS2-VASc Score = 6   This indicates a 9.7% annual risk of stroke. The patient's score is based upon: CHF History: 1 HTN History: 0 Diabetes History: 1 Stroke History: 2 Vascular Disease History: 0 Age Score: 1 Gender Score: 1   CrCl 121 Platelet count 346  Patient has not had an Afib/aflutter ablation within the last 3 months or DCCV within the last 30 days  Per office protocol, patient can hold Eliquis  for 2 days prior to procedure.   Patient will not need bridging with Lovenox  (enoxaparin ) around procedure.  **This guidance is not considered finalized until pre-operative APP has relayed final recommendations.**

## 2024-01-23 NOTE — Telephone Encounter (Signed)
   Patient Name: Amanda Lewis  DOB: 10/24/1955 MRN: [id number]  Primary Cardiologist: None  Chart reviewed as part of pre-operative protocol coverage. Given past medical history and time since last visit, based on ACC/AHA guidelines, [name] is at acceptable risk for the planned procedure without further cardiovascular testing.   The patient was advised that if she develops new symptoms prior to surgery to contact our office to arrange for a follow-up visit, and she verbalized understanding.  Per office protocol, patient can hold Eliquis  for 2 days prior to procedure.   Patient will not need bridging with Lovenox  (enoxaparin ) around procedure.  I will route this recommendation to the requesting party via Epic fax function and remove from pre-op pool.  Please call with questions.  Francene Ing, Retha Cast, NP 01/23/2024, 10:10 AM

## 2024-01-25 DIAGNOSIS — M545 Low back pain, unspecified: Secondary | ICD-10-CM | POA: Diagnosis not present

## 2024-01-25 DIAGNOSIS — I509 Heart failure, unspecified: Secondary | ICD-10-CM | POA: Diagnosis not present

## 2024-01-25 DIAGNOSIS — R0902 Hypoxemia: Secondary | ICD-10-CM | POA: Diagnosis not present

## 2024-01-25 DIAGNOSIS — M549 Dorsalgia, unspecified: Secondary | ICD-10-CM | POA: Diagnosis not present

## 2024-01-25 DIAGNOSIS — Z79899 Other long term (current) drug therapy: Secondary | ICD-10-CM | POA: Diagnosis not present

## 2024-01-25 DIAGNOSIS — N200 Calculus of kidney: Secondary | ICD-10-CM | POA: Diagnosis not present

## 2024-01-25 DIAGNOSIS — N3001 Acute cystitis with hematuria: Secondary | ICD-10-CM | POA: Diagnosis not present

## 2024-01-25 DIAGNOSIS — R531 Weakness: Secondary | ICD-10-CM | POA: Diagnosis not present

## 2024-01-25 DIAGNOSIS — I4891 Unspecified atrial fibrillation: Secondary | ICD-10-CM | POA: Diagnosis not present

## 2024-01-25 DIAGNOSIS — R5381 Other malaise: Secondary | ICD-10-CM | POA: Diagnosis not present

## 2024-01-26 DIAGNOSIS — M810 Age-related osteoporosis without current pathological fracture: Secondary | ICD-10-CM | POA: Diagnosis not present

## 2024-01-26 DIAGNOSIS — M549 Dorsalgia, unspecified: Secondary | ICD-10-CM | POA: Diagnosis not present

## 2024-01-26 DIAGNOSIS — E7800 Pure hypercholesterolemia, unspecified: Secondary | ICD-10-CM | POA: Diagnosis not present

## 2024-01-26 DIAGNOSIS — I110 Hypertensive heart disease with heart failure: Secondary | ICD-10-CM | POA: Diagnosis not present

## 2024-01-26 DIAGNOSIS — K760 Fatty (change of) liver, not elsewhere classified: Secondary | ICD-10-CM | POA: Diagnosis not present

## 2024-01-26 DIAGNOSIS — I69354 Hemiplegia and hemiparesis following cerebral infarction affecting left non-dominant side: Secondary | ICD-10-CM | POA: Diagnosis not present

## 2024-01-26 DIAGNOSIS — R911 Solitary pulmonary nodule: Secondary | ICD-10-CM | POA: Diagnosis not present

## 2024-01-26 DIAGNOSIS — M545 Low back pain, unspecified: Secondary | ICD-10-CM | POA: Diagnosis not present

## 2024-01-26 DIAGNOSIS — G5600 Carpal tunnel syndrome, unspecified upper limb: Secondary | ICD-10-CM | POA: Diagnosis not present

## 2024-01-26 DIAGNOSIS — I700 Atherosclerosis of aorta: Secondary | ICD-10-CM | POA: Diagnosis not present

## 2024-01-26 DIAGNOSIS — I051 Rheumatic mitral insufficiency: Secondary | ICD-10-CM | POA: Diagnosis not present

## 2024-01-26 DIAGNOSIS — J9611 Chronic respiratory failure with hypoxia: Secondary | ICD-10-CM | POA: Diagnosis not present

## 2024-01-26 DIAGNOSIS — R531 Weakness: Secondary | ICD-10-CM | POA: Diagnosis not present

## 2024-01-26 DIAGNOSIS — M1990 Unspecified osteoarthritis, unspecified site: Secondary | ICD-10-CM | POA: Diagnosis not present

## 2024-01-26 DIAGNOSIS — R739 Hyperglycemia, unspecified: Secondary | ICD-10-CM | POA: Diagnosis not present

## 2024-01-26 DIAGNOSIS — I509 Heart failure, unspecified: Secondary | ICD-10-CM | POA: Diagnosis not present

## 2024-01-26 DIAGNOSIS — D509 Iron deficiency anemia, unspecified: Secondary | ICD-10-CM | POA: Diagnosis not present

## 2024-01-26 DIAGNOSIS — G4700 Insomnia, unspecified: Secondary | ICD-10-CM | POA: Diagnosis not present

## 2024-01-26 DIAGNOSIS — N200 Calculus of kidney: Secondary | ICD-10-CM | POA: Diagnosis not present

## 2024-01-26 DIAGNOSIS — Z7901 Long term (current) use of anticoagulants: Secondary | ICD-10-CM | POA: Diagnosis not present

## 2024-01-26 DIAGNOSIS — E041 Nontoxic single thyroid nodule: Secondary | ICD-10-CM | POA: Diagnosis not present

## 2024-01-26 DIAGNOSIS — Z7401 Bed confinement status: Secondary | ICD-10-CM | POA: Diagnosis not present

## 2024-01-26 DIAGNOSIS — K219 Gastro-esophageal reflux disease without esophagitis: Secondary | ICD-10-CM | POA: Diagnosis not present

## 2024-01-26 DIAGNOSIS — G4733 Obstructive sleep apnea (adult) (pediatric): Secondary | ICD-10-CM | POA: Diagnosis not present

## 2024-01-26 DIAGNOSIS — N3001 Acute cystitis with hematuria: Secondary | ICD-10-CM | POA: Diagnosis not present

## 2024-01-26 DIAGNOSIS — J4489 Other specified chronic obstructive pulmonary disease: Secondary | ICD-10-CM | POA: Diagnosis not present

## 2024-01-26 DIAGNOSIS — R5381 Other malaise: Secondary | ICD-10-CM | POA: Diagnosis not present

## 2024-01-26 DIAGNOSIS — G629 Polyneuropathy, unspecified: Secondary | ICD-10-CM | POA: Diagnosis not present

## 2024-01-26 DIAGNOSIS — N3281 Overactive bladder: Secondary | ICD-10-CM | POA: Diagnosis not present

## 2024-01-26 DIAGNOSIS — I4891 Unspecified atrial fibrillation: Secondary | ICD-10-CM | POA: Diagnosis not present

## 2024-01-28 DIAGNOSIS — E785 Hyperlipidemia, unspecified: Secondary | ICD-10-CM | POA: Diagnosis not present

## 2024-01-28 DIAGNOSIS — N3281 Overactive bladder: Secondary | ICD-10-CM | POA: Diagnosis not present

## 2024-01-28 DIAGNOSIS — G4700 Insomnia, unspecified: Secondary | ICD-10-CM | POA: Diagnosis not present

## 2024-01-29 DIAGNOSIS — J449 Chronic obstructive pulmonary disease, unspecified: Secondary | ICD-10-CM | POA: Diagnosis not present

## 2024-02-01 ENCOUNTER — Encounter: Payer: Self-pay | Admitting: Hematology and Oncology

## 2024-02-06 DIAGNOSIS — G4733 Obstructive sleep apnea (adult) (pediatric): Secondary | ICD-10-CM | POA: Diagnosis not present

## 2024-02-09 ENCOUNTER — Other Ambulatory Visit: Payer: Self-pay | Admitting: Hematology and Oncology

## 2024-02-09 DIAGNOSIS — G4733 Obstructive sleep apnea (adult) (pediatric): Secondary | ICD-10-CM | POA: Diagnosis not present

## 2024-02-09 DIAGNOSIS — J441 Chronic obstructive pulmonary disease with (acute) exacerbation: Secondary | ICD-10-CM | POA: Diagnosis not present

## 2024-02-09 DIAGNOSIS — R627 Adult failure to thrive: Secondary | ICD-10-CM | POA: Diagnosis not present

## 2024-02-09 DIAGNOSIS — D509 Iron deficiency anemia, unspecified: Secondary | ICD-10-CM

## 2024-02-09 DIAGNOSIS — J301 Allergic rhinitis due to pollen: Secondary | ICD-10-CM | POA: Diagnosis not present

## 2024-02-09 DIAGNOSIS — J9611 Chronic respiratory failure with hypoxia: Secondary | ICD-10-CM | POA: Diagnosis not present

## 2024-02-10 ENCOUNTER — Ambulatory Visit: Admitting: Hematology and Oncology

## 2024-02-10 ENCOUNTER — Inpatient Hospital Stay (HOSPITAL_BASED_OUTPATIENT_CLINIC_OR_DEPARTMENT_OTHER): Admitting: Hematology and Oncology

## 2024-02-10 ENCOUNTER — Other Ambulatory Visit: Payer: Self-pay

## 2024-02-10 ENCOUNTER — Inpatient Hospital Stay: Attending: Hematology and Oncology

## 2024-02-10 ENCOUNTER — Other Ambulatory Visit

## 2024-02-10 ENCOUNTER — Encounter: Payer: Self-pay | Admitting: Hematology and Oncology

## 2024-02-10 ENCOUNTER — Telehealth: Payer: Self-pay | Admitting: Hematology and Oncology

## 2024-02-10 VITALS — BP 118/47 | HR 79 | Temp 98.3°F | Resp 16 | Ht 64.0 in

## 2024-02-10 DIAGNOSIS — D509 Iron deficiency anemia, unspecified: Secondary | ICD-10-CM

## 2024-02-10 DIAGNOSIS — Z87891 Personal history of nicotine dependence: Secondary | ICD-10-CM | POA: Diagnosis not present

## 2024-02-10 DIAGNOSIS — Z79899 Other long term (current) drug therapy: Secondary | ICD-10-CM | POA: Diagnosis not present

## 2024-02-10 LAB — CBC WITH DIFFERENTIAL (CANCER CENTER ONLY)
Abs Immature Granulocytes: 0.02 10*3/uL (ref 0.00–0.07)
Basophils Absolute: 0 10*3/uL (ref 0.0–0.1)
Basophils Relative: 0 %
Eosinophils Absolute: 0.1 10*3/uL (ref 0.0–0.5)
Eosinophils Relative: 2 %
HCT: 34.2 % — ABNORMAL LOW (ref 36.0–46.0)
Hemoglobin: 10.1 g/dL — ABNORMAL LOW (ref 12.0–15.0)
Immature Granulocytes: 0 %
Lymphocytes Relative: 25 %
Lymphs Abs: 2.2 10*3/uL (ref 0.7–4.0)
MCH: 25.8 pg — ABNORMAL LOW (ref 26.0–34.0)
MCHC: 29.5 g/dL — ABNORMAL LOW (ref 30.0–36.0)
MCV: 87.2 fL (ref 80.0–100.0)
Monocytes Absolute: 0.5 10*3/uL (ref 0.1–1.0)
Monocytes Relative: 6 %
Neutro Abs: 6.2 10*3/uL (ref 1.7–7.7)
Neutrophils Relative %: 67 %
Platelet Count: 285 10*3/uL (ref 150–400)
RBC: 3.92 MIL/uL (ref 3.87–5.11)
RDW: 16.3 % — ABNORMAL HIGH (ref 11.5–15.5)
WBC Count: 9.1 10*3/uL (ref 4.0–10.5)
nRBC: 0 % (ref 0.0–0.2)

## 2024-02-10 LAB — IRON AND TIBC
Iron: 26 ug/dL — ABNORMAL LOW (ref 28–170)
Saturation Ratios: 6 % — ABNORMAL LOW (ref 10.4–31.8)
TIBC: 428 ug/dL (ref 250–450)
UIBC: 402 ug/dL

## 2024-02-10 LAB — VITAMIN B12: Vitamin B-12: 285 pg/mL (ref 180–914)

## 2024-02-10 LAB — FOLATE: Folate: 3.6 ng/mL — ABNORMAL LOW (ref >5.9)

## 2024-02-10 LAB — TSH: TSH: 0.578 u[IU]/mL (ref 0.350–4.500)

## 2024-02-10 LAB — FERRITIN: Ferritin: 17 ng/mL (ref 11–307)

## 2024-02-10 NOTE — Telephone Encounter (Signed)
 Patient has been scheduled for follow-up visit per 02/09/24 LOS.  Pt given an appt calendar with date and time.

## 2024-02-10 NOTE — Progress Notes (Signed)
 [facility] [address] [place],  [place]  [address] [phone]  Clinic Day:  02/10/2024   Referring physician: O'Buch, Greta, PA-C  Patient Care Team: Patient Care Team: O'Buch, Greta, PA-C as PCP - General (Internal Medicine)   REASON FOR CONSULTATION:  Iron  deficiency anemia  HISTORY OF PRESENT ILLNESS:  Amanda Lewis is a 68 y.o. female with a history of iron  deficiency anemia who is referred in consultation by Lujean Sake, PA-C for assessment and management. She has a history of requiring multiple blood transfusions over the last year. She states she used to get iron  injections years ago and was then switched to oral iron . Her most recent transfusion occurred on 03/21 after a follow up with cardiology for atrial fibrillation. She feels better after infusions; however, it doesn't last long before she is fatigued again. She appears very pale today and complains of severe fatigue. She has a AKA of the left lower extremity and reports increased difficulty transferring from her wheelchair. She denies fever, chills, nausea or vomiting. She does have chronic respiratory conditions and wears oxygen . She does not note an increase to shortness of breath, cough or chest pain. She complains of ongoing constipation. She does not report any blood in her stools. Medical history is significant for atrial fibrillation, incontinence, carpal tunnel syndrome, fatty liver, insomnia, chronic hypoxemic respiratory failure,  left hemiplegia, overactive bladder, AKA of the left lower extremity, hypertension, sleep apnea and stroke. Surgical history consists of hysterectomy, appendectomy, amputation of the left lower extremity as well as revision of that amputation. Family history consists of heart disease, arthritis, myocardial infarction, hyperlipidemia, seizures, hypothyroidism, hypertension and diabetes mellitus. She is a former smoker having quit 15 years ago.  REVIEW OF SYSTEMS:  Review  of Systems  Constitutional:  Positive for fatigue.  HENT:  Negative.    Eyes: Negative.   Respiratory:  Positive for shortness of breath.   Cardiovascular: Negative.   Gastrointestinal:  Positive for constipation.  Genitourinary:  Positive for bladder incontinence.   Musculoskeletal:  Positive for gait problem.  Skin: Negative.   Neurological:  Positive for gait problem and numbness.  Hematological: Negative.   Psychiatric/Behavioral:  Positive for sleep disturbance.      VITALS:  There were no vitals taken for this visit.  Wt Readings from Last 3 Encounters:  12/29/23 198 lb (89.8 kg)  11/18/23 198 lb (89.8 kg)  03/22/23 198 lb (89.8 kg)    There is no height or weight on file to calculate BMI.  Performance status (ECOG): 2 - Symptomatic, <50% confined to bed  PHYSICAL EXAM:  Physical Exam Constitutional:      Appearance: Normal appearance. She is normal weight.  HENT:     Head: Normocephalic and atraumatic.     Mouth/Throat:     Mouth: Mucous membranes are moist.   Cardiovascular:     Rate and Rhythm: Normal rate and regular rhythm.  Pulmonary:     Effort: Pulmonary effort is normal.     Breath sounds: Normal breath sounds.   Musculoskeletal:        General: Normal range of motion.     Cervical back: Normal range of motion.     Comments: AKA left lower extremity    Skin:    General: Skin is warm and dry.   Neurological:     General: No focal deficit present.     Mental Status: She is alert and oriented to person, place, and time. Mental status  is at baseline.     Gait: Gait abnormal.   Psychiatric:        Mood and Affect: Mood normal.        Behavior: Behavior normal.        Thought Content: Thought content normal.        Judgment: Judgment normal.     LABS:      Latest Ref Rng & Units 02/10/2024   10:47 AM 12/29/2023   10:19 AM 11/18/2023    9:49 AM  CBC  WBC 4.0 - 10.5 K/uL 9.1  7.5  8.5   Hemoglobin 12.0 - 15.0 g/dL 16.1  7.8  7.2   Hematocrit  36.0 - 46.0 % 34.2  28.8  26.5   Platelets 150 - 400 K/uL 285  346  334       Latest Ref Rng & Units 12/29/2023   10:19 AM 12/27/2020    3:56 AM 12/26/2020    9:58 AM  CMP  Glucose 70 - 99 mg/dL 096  98  045   BUN 8 - 23 mg/dL 11  10  9    Creatinine 0.44 - 1.00 mg/dL 4.09  8.11  9.14   Sodium 135 - 145 mmol/L 142  142  142   Potassium 3.5 - 5.1 mmol/L 3.3  3.7  3.6   Chloride 98 - 111 mmol/L 97  110  106   CO2 22 - 32 mmol/L 36  26  31   Calcium  8.9 - 10.3 mg/dL 9.6  9.8  9.5   Total Protein 6.5 - 8.1 g/dL 7.1     Total Bilirubin 0.0 - 1.2 mg/dL 0.4     Alkaline Phos 38 - 126 U/L 93     AST 15 - 41 U/L 20     ALT 0 - 44 U/L 15        No results found for: CEA1, CEA / No results found for: CEA1, CEA No results found for: PSA1 No results found for: NWG956 No results found for: CAN125  No results found for: TOTALPROTELP, ALBUMINELP, A1GS, A2GS, BETS, BETA2SER, GAMS, MSPIKE, SPEI Lab Results  Component Value Date   TIBC 552 (H) 12/29/2023   FERRITIN 4 (L) 12/29/2023   IRONPCTSAT 4 (L) 12/29/2023   No results found for: LDH  STUDIES:  No results found.    HISTORY:   Past Medical History:  Diagnosis Date   A-fib (HCC)    Anemia    Anxiety    on treatment   Arthritis    arms   Asthma    exertional   Chronic lower back pain    Community acquired bacterial pneumonia 07/20/2018   Complex regional pain syndrome of left lower extremity 01/13/2012   Constipation 07/20/2018   Last Assessment & Plan:  Formatting of this note might be different from the original. -continue senokot and miralax  -added suppository and sorbitol today  -order KUB if develops abd pain/n/v   COPD (chronic obstructive pulmonary disease) (HCC)    usesO2 24/7   Depression    DNR (do not resuscitate)    Dysuria-frequency syndrome    Emphysema of lung (HCC)    Fatty liver    GERD (gastroesophageal reflux disease)    Headache(784.0)    intermittent migraines    History of CVA (cerebrovascular accident) 07/18/2018   Last Assessment & Plan:  Formatting of this note might be different from the original. -not on antiplatelet therapy currently - has allergy (urticaria/hives) to aspirin   -would  consider starting plavix monotherapy for secondary stroke prevention after completing DVT prophylaxis  -continue statin   History of encephalopathy    History of home oxygen  therapy 07/18/2018   History of kidney stones    Insomnia    Left acetabular fracture (HCC) 07/18/2018   Last Assessment & Plan:  Formatting of this note might be different from the original. -ortho plans for non operative management -Management per ortho primary team: PT/OT, wound care, weight bearing status and pain control -PT/OT rec SNF vs progression to home with HHPT   Left hemiplegia (HCC)    Left upper quadrant pain 07/18/2018   Last Assessment & Plan:  Formatting of this note might be different from the original. Lipase neg 2/2 8th rib fracture   -lidocaine  patch  -encouraged IS   Leukocytosis 07/18/2018   Last Assessment & Plan:  Formatting of this note is different from the original. Lab Results  Component Value Date   WBC 14.9 (H) 07/20/2018  CXR and UA normal   -likely reactive, may be developing PNA -will start doxycycline -repeat CBC tomorrow   Major depression 07/18/2018   Last Assessment & Plan:  Formatting of this note might be different from the original. 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   Sleep apnea 2010   uses continous oxygen    Stroke  [facility]) 1997    Past Surgical History:  Procedure Laterality Date   ABDOMINAL HYSTERECTOMY  08/30/2001   AMPUTATION  01/12/2012   Procedure: AMPUTATION ABOVE KNEE;  Surgeon: Timothy Ford, MD;  Location: [facility] OR;  Service: Orthopedics;  Laterality: Left;  Left Above Knee Amputation   APPENDECTOMY     BIOPSY THYROID      FRACTURE SURGERY Left    Hip   Morphine  Pump     Morphine  Pump Removed  08/30/2004   ORIF ANKLE FRACTURE  08/30/1993   STUMP REVISION Left 07/19/2015   Procedure: Revision Left Above Knee Amputation;  Surgeon: Timothy Ford, MD;  Location: [facility] OR;  Service: Orthopedics;  Laterality: Left;    Family History  Problem Relation Age of Onset   Diabetes Mother    Hypertension Mother    Heart disease Mother    Arthritis Mother    Hyperlipidemia Mother    Hypothyroidism Mother    Diabetes Father    Hypertension Father    Hyperlipidemia Father    Arthritis Father    Heart attack Father    Heart attack Sister    Seizures Sister    Kidney disease Sister    Heart attack Brother    Arthritis Brother    Anesthesia problems Neg Hx     Social History:  reports that she quit smoking about 15 years ago. Her smoking use included cigarettes. She started smoking about 50 years ago. She has a 70 pack-year smoking history. She has never used smokeless tobacco. She reports that she does not drink alcohol and does not use drugs.The patient is accompanied by daughter today.  Allergies:  Allergies  Allergen Reactions   Aspirin  Hives   Bactrim [Sulfamethoxazole-Trimethoprim] Hives   Nitroglycerin Other (See Comments)    Blood pressure drops dangerously low   Oxycodone -Aspirin  Hives    Takes percocet without problems   Sulfamethoxazole-Trimethoprim Hives   Tape Rash    Adhesive Adhesive    Current Medications: Current Outpatient Medications  Medication Sig Dispense Refill   acetaZOLAMIDE  (DIAMOX ) 500 MG capsule Take 1 capsule (500 mg total) by mouth daily. 20 capsule 0    albuterol  (PROVENTIL  HFA;VENTOLIN  HFA) 108 (90 BASE) MCG/ACT inhaler Inhale 2 puffs into the lungs every 6 (six) hours as needed for wheezing or shortness of breath. For shortness of breath     alendronate (FOSAMAX) 70 MG tablet Take 70 mg by mouth once a week.     ARIPiprazole (ABILIFY) 10 MG tablet Take 10 mg by mouth daily.     atorvastatin  (LIPITOR) 80 MG tablet Take 80 mg by mouth daily.     Calcium  Carbonate-Vitamin D (CALTRATE 600+D PO) Take by mouth.     clonazePAM (KLONOPIN) 0.5 MG tablet Take 0.5 mg by mouth daily as needed.     cromolyn (OPTICROM) 4 % ophthalmic solution 4 (four) times daily.     cyclobenzaprine  (FLEXERIL ) 5 MG tablet Take 5 mg by mouth 2 (two) times daily as needed for spasms.     diltiazem  (CARDIZEM  CD) 120 MG 24 hr capsule Take 1 capsule (120 mg total) by mouth daily. 90 capsule 0   dronedarone  (MULTAQ ) 400 MG tablet Take 1 tablet (400 mg total) by mouth 2 (two) times daily. 180 tablet 2   ELIQUIS  5 MG TABS tablet TAKE 1 TABLET BY MOUTH TWICE A DAY 180 tablet 1   ferrous sulfate 325 (65 FE) MG EC tablet Take 1 tablet by mouth daily.     FLUoxetine (PROZAC) 20 MG capsule Take 20 mg by mouth every morning.     fluticasone  (FLONASE ) 50 MCG/ACT nasal spray Place 2 sprays into both nostrils daily.     furosemide  (LASIX ) 40 MG tablet Take 40 mg by mouth daily.     gabapentin  (NEURONTIN ) 400 MG capsule Take 400 mg by mouth 3 (three) times daily.     lidocaine  (LIDODERM ) 5 % Place onto the skin daily.     loratadine  (CLARITIN ) 10 MG tablet Take 10 mg by mouth daily.     melatonin 3 MG TABS tablet Take 3 mg by mouth at bedtime.     MYRBETRIQ 50 MG TB24 tablet Take 50 mg by mouth daily.     OHTUVAYRE  3 MG/2.5ML SUSP      OXYGEN  Inhale 2 L into the lungs.     pantoprazole  (PROTONIX ) 40 MG tablet Take 40 mg by mouth daily.     Polyethylene Glycol 3350  (MIRALAX  PO) Take by mouth.     potassium chloride (MICRO-K) 10 MEQ CR capsule Take 10 mEq by mouth daily.     tamsulosin  (FLOMAX) 0.4 MG CAPS capsule Take 0.4 mg by mouth daily.     traMADol  (ULTRAM ) 50 MG tablet Take 50 mg by mouth 3 (three) times daily as needed.     traZODone (DESYREL) 100 MG tablet Take 100 mg by mouth at bedtime.     TRELEGY ELLIPTA 100-62.5-25 MCG/INH AEPB Inhale 1 puff into the lungs daily.     No current facility-administered medications for this visit.     ASSESSMENT & PLAN:   Assessment:  Amanda Lewis is a 68 y.o. female with history of iron   deficiency anemia. She has been on oral iron  for several years and iron  injections prior to that. Most recently she has required blood transfusions due to her anemia with the most recent being 03/21. She received iron  infusions with the last being 4 weeks ago. Today, CBC reveals Hgb 10.1 increased from 7.8 prior to infusions. She has not noted an improvement in symptoms. She remains mainly weak. We discussed her starting an OTC multivitamin. We discussed that it may take a little longer for her symptoms to improve. We have repeated iron  studies today with results pending. She is scheduled for consult with GI for EGD/ colonoscopy.   Plan: 1.  Return to clinic in 2 weeks for repeat evaluation. If scheduled for GI procedures before this, we will have her return sooner.   I discussed the assessment and treatment plan with the patient.  The patient was provided an opportunity to ask questions and all were answered.  The patient agreed with the plan and demonstrated an understanding of the instructions.    Thank you for the referral    30 minutes was spent in patient care.  This included time spent preparing to see the patient (e.g., review of tests), obtaining and/or reviewing separately obtained history, counseling and educating the patient/family/caregiver, ordering medications, tests, or procedures; documenting clinical information in the electronic or other health record, independently interpreting results and communicating results to the  patient/family/caregiver as well as coordination of care.      Adelaide Adjutant, NP   Family Nurse Practitioner - Board Certified [facility] [phone]

## 2024-02-22 DIAGNOSIS — J9611 Chronic respiratory failure with hypoxia: Secondary | ICD-10-CM | POA: Diagnosis not present

## 2024-02-22 DIAGNOSIS — J441 Chronic obstructive pulmonary disease with (acute) exacerbation: Secondary | ICD-10-CM | POA: Diagnosis not present

## 2024-02-22 DIAGNOSIS — G4733 Obstructive sleep apnea (adult) (pediatric): Secondary | ICD-10-CM | POA: Diagnosis not present

## 2024-02-22 DIAGNOSIS — J301 Allergic rhinitis due to pollen: Secondary | ICD-10-CM | POA: Diagnosis not present

## 2024-02-23 DIAGNOSIS — J449 Chronic obstructive pulmonary disease, unspecified: Secondary | ICD-10-CM | POA: Diagnosis not present

## 2024-02-23 DIAGNOSIS — R739 Hyperglycemia, unspecified: Secondary | ICD-10-CM | POA: Diagnosis not present

## 2024-02-23 DIAGNOSIS — D509 Iron deficiency anemia, unspecified: Secondary | ICD-10-CM | POA: Diagnosis not present

## 2024-02-23 DIAGNOSIS — Z79899 Other long term (current) drug therapy: Secondary | ICD-10-CM | POA: Diagnosis not present

## 2024-02-27 ENCOUNTER — Other Ambulatory Visit: Payer: Self-pay

## 2024-02-27 ENCOUNTER — Inpatient Hospital Stay

## 2024-02-27 ENCOUNTER — Encounter: Payer: Self-pay | Admitting: Hematology and Oncology

## 2024-02-27 ENCOUNTER — Inpatient Hospital Stay (HOSPITAL_BASED_OUTPATIENT_CLINIC_OR_DEPARTMENT_OTHER): Admitting: Hematology and Oncology

## 2024-02-27 ENCOUNTER — Telehealth: Payer: Self-pay | Admitting: Hematology and Oncology

## 2024-02-27 VITALS — BP 117/41 | HR 75 | Resp 20 | Ht 64.0 in | Wt 197.0 lb

## 2024-02-27 DIAGNOSIS — N3281 Overactive bladder: Secondary | ICD-10-CM | POA: Diagnosis not present

## 2024-02-27 DIAGNOSIS — Z79899 Other long term (current) drug therapy: Secondary | ICD-10-CM | POA: Diagnosis not present

## 2024-02-27 DIAGNOSIS — D509 Iron deficiency anemia, unspecified: Secondary | ICD-10-CM | POA: Diagnosis not present

## 2024-02-27 DIAGNOSIS — E785 Hyperlipidemia, unspecified: Secondary | ICD-10-CM | POA: Diagnosis not present

## 2024-02-27 DIAGNOSIS — G4700 Insomnia, unspecified: Secondary | ICD-10-CM | POA: Diagnosis not present

## 2024-02-27 DIAGNOSIS — Z87891 Personal history of nicotine dependence: Secondary | ICD-10-CM | POA: Diagnosis not present

## 2024-02-27 LAB — CBC WITH DIFFERENTIAL (CANCER CENTER ONLY)
Abs Immature Granulocytes: 0.02 10*3/uL (ref 0.00–0.07)
Basophils Absolute: 0 10*3/uL (ref 0.0–0.1)
Basophils Relative: 0 %
Eosinophils Absolute: 0.3 10*3/uL (ref 0.0–0.5)
Eosinophils Relative: 4 %
HCT: 36.9 % (ref 36.0–46.0)
Hemoglobin: 10.5 g/dL — ABNORMAL LOW (ref 12.0–15.0)
Immature Granulocytes: 0 %
Lymphocytes Relative: 20 %
Lymphs Abs: 1.6 10*3/uL (ref 0.7–4.0)
MCH: 25.4 pg — ABNORMAL LOW (ref 26.0–34.0)
MCHC: 28.5 g/dL — ABNORMAL LOW (ref 30.0–36.0)
MCV: 89.1 fL (ref 80.0–100.0)
Monocytes Absolute: 0.4 10*3/uL (ref 0.1–1.0)
Monocytes Relative: 6 %
Neutro Abs: 5.5 10*3/uL (ref 1.7–7.7)
Neutrophils Relative %: 70 %
Platelet Count: 218 10*3/uL (ref 150–400)
RBC: 4.14 MIL/uL (ref 3.87–5.11)
RDW: 15.5 % (ref 11.5–15.5)
WBC Count: 7.9 10*3/uL (ref 4.0–10.5)
nRBC: 0 % (ref 0.0–0.2)

## 2024-02-27 LAB — TSH: TSH: 0.704 u[IU]/mL (ref 0.350–4.500)

## 2024-02-27 NOTE — Telephone Encounter (Signed)
 Patient has been scheduled for follow-up visit per 02/27/24 LOS.  LVM notifying pt of appt details, provided my direct number to pt if appt changes need to be made.

## 2024-02-28 DIAGNOSIS — J449 Chronic obstructive pulmonary disease, unspecified: Secondary | ICD-10-CM | POA: Diagnosis not present

## 2024-03-01 NOTE — Progress Notes (Signed)
 [facility] [address] [place],  [place]  [address] [phone]  Clinic Day:  03/01/2024   Referring physician: O'Buch, Greta, PA-C  Patient Care Team: Patient Care Team: O'Buch, Greta, PA-C as PCP - General (Internal Medicine)   REASON FOR CONSULTATION:  Iron  deficiency anemia  HISTORY OF PRESENT ILLNESS:  Amanda Lewis is a 68 y.o. female with a history of iron  deficiency anemia who is referred in consultation by Glenis Olive, PA-C for assessment and management. She has a history of requiring multiple blood transfusions over the last year. She states she used to get iron  injections years ago and was then switched to oral iron . Her most recent transfusion occurred on 03/21 after a follow up with cardiology for atrial fibrillation. She feels better after infusions; however, it doesn't last long before she is fatigued again. She appears very pale today and complains of severe fatigue. She has a AKA of the left lower extremity and reports increased difficulty transferring from her wheelchair. She denies fever, chills, nausea or vomiting. She does have chronic respiratory conditions and wears oxygen . She does not note an increase to shortness of breath, cough or chest pain. She complains of ongoing constipation. She does not report any blood in her stools. Medical history is significant for atrial fibrillation, incontinence, carpal tunnel syndrome, fatty liver, insomnia, chronic hypoxemic respiratory failure,  left hemiplegia, overactive bladder, AKA of the left lower extremity, hypertension, sleep apnea and stroke. Surgical history consists of hysterectomy, appendectomy, amputation of the left lower extremity as well as revision of that amputation. Family history consists of heart disease, arthritis, myocardial infarction, hyperlipidemia, seizures, hypothyroidism, hypertension and diabetes mellitus. She is a former smoker having quit 15 years ago.  REVIEW OF SYSTEMS:  Review  of Systems  Constitutional:  Positive for fatigue.  HENT:  Negative.    Eyes: Negative.   Respiratory:  Positive for shortness of breath.   Cardiovascular: Negative.   Gastrointestinal:  Positive for constipation.  Genitourinary:  Positive for bladder incontinence.   Musculoskeletal:  Positive for gait problem.  Skin: Negative.   Neurological:  Positive for gait problem and numbness.  Hematological: Negative.   Psychiatric/Behavioral:  Positive for sleep disturbance.      VITALS:  Blood pressure (!) 117/41, pulse 75, resp. rate 20, height 5' 4 (1.626 m), weight 197 lb (89.4 kg), SpO2 93%.  Wt Readings from Last 3 Encounters:  02/27/24 197 lb (89.4 kg)  12/29/23 198 lb (89.8 kg)  11/18/23 198 lb (89.8 kg)    Body mass index is 33.81 kg/m.  Performance status (ECOG): 2 - Symptomatic, <50% confined to bed  PHYSICAL EXAM:  Physical Exam Constitutional:      Appearance: Normal appearance. She is normal weight.  HENT:     Head: Normocephalic and atraumatic.     Mouth/Throat:     Mouth: Mucous membranes are moist.  Cardiovascular:     Rate and Rhythm: Normal rate and regular rhythm.  Pulmonary:     Effort: Pulmonary effort is normal.     Breath sounds: Normal breath sounds.  Musculoskeletal:        General: Normal range of motion.     Cervical back: Normal range of motion.     Comments: AKA left lower extremity   Skin:    General: Skin is warm and dry.  Neurological:     General: No focal deficit present.     Mental Status: She is alert and oriented to person, place,  and time. Mental status is at baseline.     Gait: Gait abnormal.  Psychiatric:        Mood and Affect: Mood normal.        Behavior: Behavior normal.        Thought Content: Thought content normal.        Judgment: Judgment normal.      LABS:      Latest Ref Rng & Units 02/27/2024   10:22 AM 02/10/2024   10:47 AM 12/29/2023   10:19 AM  CBC  WBC 4.0 - 10.5 K/uL 7.9  9.1  7.5   Hemoglobin 12.0 -  15.0 g/dL 89.4  89.8  7.8   Hematocrit 36.0 - 46.0 % 36.9  34.2  28.8   Platelets 150 - 400 K/uL 218  285  346       Latest Ref Rng & Units 12/29/2023   10:19 AM 12/27/2020    3:56 AM 12/26/2020    9:58 AM  CMP  Glucose 70 - 99 mg/dL 861  98  870   BUN 8 - 23 mg/dL 11  10  9    Creatinine 0.44 - 1.00 mg/dL 9.37  9.52  9.47   Sodium 135 - 145 mmol/L 142  142  142   Potassium 3.5 - 5.1 mmol/L 3.3  3.7  3.6   Chloride 98 - 111 mmol/L 97  110  106   CO2 22 - 32 mmol/L 36  26  31   Calcium  8.9 - 10.3 mg/dL 9.6  9.8  9.5   Total Protein 6.5 - 8.1 g/dL 7.1     Total Bilirubin 0.0 - 1.2 mg/dL 0.4     Alkaline Phos 38 - 126 U/L 93     AST 15 - 41 U/L 20     ALT 0 - 44 U/L 15        No results found for: CEA1, CEA / No results found for: CEA1, CEA No results found for: PSA1 No results found for: CAN199 No results found for: CAN125  No results found for: TOTALPROTELP, ALBUMINELP, A1GS, A2GS, BETS, BETA2SER, GAMS, MSPIKE, SPEI Lab Results  Component Value Date   TIBC 428 02/10/2024   TIBC 552 (H) 12/29/2023   FERRITIN 17 02/10/2024   FERRITIN 4 (L) 12/29/2023   IRONPCTSAT 6 (L) 02/10/2024   IRONPCTSAT 4 (L) 12/29/2023   No results found for: LDH  STUDIES:  No results found.    HISTORY:   Past Medical History:  Diagnosis Date   A-fib (HCC)    Anemia    Anxiety    on treatment   Arthritis    arms   Asthma    exertional   Chronic lower back pain    Community acquired bacterial pneumonia 07/20/2018   Complex regional pain syndrome of left lower extremity 01/13/2012   Constipation 07/20/2018   Last Assessment & Plan:  Formatting of this note might be different from the original. -continue senokot and miralax  -added suppository and sorbitol today  -order KUB if develops abd pain/n/v   COPD (chronic obstructive pulmonary disease) (HCC)    usesO2 24/7   Depression    DNR (do not resuscitate)    Dysuria-frequency syndrome    Emphysema of  lung (HCC)    Fatty liver    GERD (gastroesophageal reflux disease)    Headache(784.0)    intermittent migraines   History of CVA (cerebrovascular accident) 07/18/2018   Last Assessment & Plan:  Formatting of this note  might be different from the original. -not on antiplatelet therapy currently - has allergy (urticaria/hives) to aspirin   -would consider starting plavix monotherapy for secondary stroke prevention after completing DVT prophylaxis  -continue statin   History of encephalopathy    History of home oxygen  therapy 07/18/2018   History of kidney stones    Insomnia    Left acetabular fracture (HCC) 07/18/2018   Last Assessment & Plan:  Formatting of this note might be different from the original. -ortho plans for non operative management -Management per ortho primary team: PT/OT, wound care, weight bearing status and pain control -PT/OT rec SNF vs progression to home with HHPT   Left hemiplegia (HCC)    Left upper quadrant pain 07/18/2018   Last Assessment & Plan:  Formatting of this note might be different from the original. Lipase neg 2/2 8th rib fracture   -lidocaine  patch  -encouraged IS   Leukocytosis 07/18/2018   Last Assessment & Plan:  Formatting of this note is different from the original. Lab Results  Component Value Date   WBC 14.9 (H) 07/20/2018  CXR and UA normal   -likely reactive, may be developing PNA -will start doxycycline -repeat CBC tomorrow   Major depression 07/18/2018   Last Assessment & Plan:  Formatting of this note might be different from the original. QTc 438 11/19  -continue home lexapro , wellbutrin, duloxetine , seroquel , and trazodone  -continue diazepam  prn  -counseled her on polypharmacy and asking her PCP to try to wean her off some of these medications   Multiple thyroid  nodules    Pneumonia    Positive colorectal cancer screening using Cologuard test    Recurrent upper respiratory infection (URI)    Rotator cuff tear, right    RSD lower limb    left  lower leg   S/P AKA (above knee amputation) (HCC) 07/19/2015   Sleep apnea 2010   uses continous oxygen    Stroke [facility]) 1997    Past Surgical History:  Procedure Laterality Date   ABDOMINAL HYSTERECTOMY  08/30/2001   AMPUTATION  01/12/2012   Procedure: AMPUTATION ABOVE KNEE;  Surgeon: Jerona LULLA Sage, MD;  Location: [facility] OR;  Service: Orthopedics;  Laterality: Left;  Left Above Knee Amputation   APPENDECTOMY     BIOPSY THYROID      FRACTURE SURGERY Left    Hip   Morphine  Pump     Morphine  Pump Removed  08/30/2004   ORIF ANKLE FRACTURE  08/30/1993   STUMP REVISION Left 07/19/2015   Procedure: Revision Left Above Knee Amputation;  Surgeon: Jerona Sage LULLA, MD;  Location: [facility] OR;  Service: Orthopedics;  Laterality: Left;    Family History  Problem Relation Age of Onset   Diabetes Mother    Hypertension Mother    Heart disease Mother    Arthritis Mother    Hyperlipidemia Mother    Hypothyroidism Mother    Diabetes Father    Hypertension Father    Hyperlipidemia Father    Arthritis Father    Heart attack Father    Heart attack Sister    Seizures Sister    Kidney disease Sister    Heart attack Brother    Arthritis Brother    Anesthesia problems Neg Hx     Social History:  reports that she quit smoking about 15 years ago. Her smoking use included cigarettes. She started smoking about 50 years ago. She has a 70 pack-year smoking history. She has never used smokeless tobacco. She reports that she does  not drink alcohol and does not use drugs.The patient is accompanied by daughter today.  Allergies:  Allergies  Allergen Reactions   Aspirin  Hives   Bactrim [Sulfamethoxazole-Trimethoprim] Hives   Nitroglycerin Other (See Comments)    Blood pressure drops dangerously low   Oxycodone -Aspirin  Hives    Takes percocet without problems   Sulfamethoxazole-Trimethoprim Hives   Tape Rash    Adhesive Adhesive    Current Medications: Current Outpatient Medications  Medication Sig  Dispense Refill   acetaZOLAMIDE  (DIAMOX ) 500 MG capsule Take 1 capsule (500 mg total) by mouth daily. 20 capsule 0   albuterol  (PROVENTIL  HFA;VENTOLIN  HFA) 108 (90 BASE) MCG/ACT inhaler Inhale 2 puffs into the lungs every 6 (six) hours as needed for wheezing or shortness of breath. For shortness of breath     alendronate (FOSAMAX) 70 MG tablet Take 70 mg by mouth once a week.     ARIPiprazole (ABILIFY) 10 MG tablet Take 10 mg by mouth daily.     atorvastatin  (LIPITOR) 80 MG tablet Take 80 mg by mouth daily.     Calcium  Carbonate-Vitamin D (CALTRATE 600+D PO) Take by mouth.     clonazePAM (KLONOPIN) 0.5 MG tablet Take 0.5 mg by mouth daily as needed.     cromolyn (OPTICROM) 4 % ophthalmic solution 4 (four) times daily.     cyclobenzaprine  (FLEXERIL ) 5 MG tablet Take 5 mg by mouth 2 (two) times daily as needed for spasms.     diltiazem  (CARDIZEM  CD) 120 MG 24 hr capsule Take 1 capsule (120 mg total) by mouth daily. 90 capsule 0   dronedarone  (MULTAQ ) 400 MG tablet Take 1 tablet (400 mg total) by mouth 2 (two) times daily. 180 tablet 2   ELIQUIS  5 MG TABS tablet TAKE 1 TABLET BY MOUTH TWICE A DAY 180 tablet 1   ferrous sulfate 325 (65 FE) MG EC tablet Take 1 tablet by mouth daily.     FLUoxetine (PROZAC) 20 MG capsule Take 20 mg by mouth every morning.     fluticasone  (FLONASE ) 50 MCG/ACT nasal spray Place 2 sprays into both nostrils daily.     furosemide  (LASIX ) 40 MG tablet Take 40 mg by mouth daily.     gabapentin  (NEURONTIN ) 400 MG capsule Take 400 mg by mouth 3 (three) times daily.     lidocaine  (LIDODERM ) 5 % Place onto the skin daily.     loratadine  (CLARITIN ) 10 MG tablet Take 10 mg by mouth daily.     melatonin 3 MG TABS tablet Take 3 mg by mouth at bedtime.     MYRBETRIQ 50 MG TB24 tablet Take 50 mg by mouth daily.     OHTUVAYRE  3 MG/2.5ML SUSP      OXYGEN  Inhale 2 L into the lungs.     pantoprazole  (PROTONIX ) 40 MG tablet Take 40 mg by mouth daily.     Polyethylene Glycol 3350   (MIRALAX  PO) Take by mouth.     potassium chloride (MICRO-K) 10 MEQ CR capsule Take 10 mEq by mouth daily.     tamsulosin (FLOMAX) 0.4 MG CAPS capsule Take 0.4 mg by mouth daily.     traMADol  (ULTRAM ) 50 MG tablet Take 50 mg by mouth 3 (three) times daily as needed.     traZODone (DESYREL) 100 MG tablet Take 100 mg by mouth at bedtime.     TRELEGY ELLIPTA 100-62.5-25 MCG/INH AEPB Inhale 1 puff into the lungs daily.     No current facility-administered medications for this visit.  ASSESSMENT & PLAN:   Assessment:  HARLYN RATHMANN is a 68 y.o. female with history of iron  deficiency anemia. She has been on oral iron  for several years and iron  injections prior to that. Most recently she has required blood transfusions due to her anemia with the most recent being 03/21. She received iron  infusions with the last being 4 weeks ago. Today, CBC reveals Hgb 10.1 increased from 7.8 prior to infusions. Symptoms are finally improving since iron  infusions.   Plan: 1.  Return to clinic in 3 months.   I discussed the assessment and treatment plan with the patient.  The patient was provided an opportunity to ask questions and all were answered.  The patient agreed with the plan and demonstrated an understanding of the instructions.    Thank you for the referral    30 minutes was spent in patient care.  This included time spent preparing to see the patient (e.g., review of tests), obtaining and/or reviewing separately obtained history, counseling and educating the patient/family/caregiver, ordering medications, tests, or procedures; documenting clinical information in the electronic or other health record, independently interpreting results and communicating results to the patient/family/caregiver as well as coordination of care.      Eleanor DELENA Bach, NP   Family Nurse Practitioner - Board Certified [facility] [place] [phone]

## 2024-03-06 ENCOUNTER — Encounter: Payer: Self-pay | Admitting: Hematology and Oncology

## 2024-03-07 DIAGNOSIS — G4733 Obstructive sleep apnea (adult) (pediatric): Secondary | ICD-10-CM | POA: Diagnosis not present

## 2024-03-09 DIAGNOSIS — R627 Adult failure to thrive: Secondary | ICD-10-CM | POA: Diagnosis not present

## 2024-03-13 DIAGNOSIS — G43909 Migraine, unspecified, not intractable, without status migrainosus: Secondary | ICD-10-CM | POA: Diagnosis not present

## 2024-03-13 DIAGNOSIS — K2970 Gastritis, unspecified, without bleeding: Secondary | ICD-10-CM | POA: Diagnosis not present

## 2024-03-13 DIAGNOSIS — Z1211 Encounter for screening for malignant neoplasm of colon: Secondary | ICD-10-CM | POA: Diagnosis not present

## 2024-03-13 DIAGNOSIS — K621 Rectal polyp: Secondary | ICD-10-CM | POA: Diagnosis not present

## 2024-03-13 DIAGNOSIS — I509 Heart failure, unspecified: Secondary | ICD-10-CM | POA: Diagnosis not present

## 2024-03-13 DIAGNOSIS — I480 Paroxysmal atrial fibrillation: Secondary | ICD-10-CM | POA: Diagnosis not present

## 2024-03-13 DIAGNOSIS — K449 Diaphragmatic hernia without obstruction or gangrene: Secondary | ICD-10-CM | POA: Diagnosis not present

## 2024-03-13 DIAGNOSIS — J449 Chronic obstructive pulmonary disease, unspecified: Secondary | ICD-10-CM | POA: Diagnosis not present

## 2024-03-13 DIAGNOSIS — G4733 Obstructive sleep apnea (adult) (pediatric): Secondary | ICD-10-CM | POA: Diagnosis not present

## 2024-03-13 DIAGNOSIS — K219 Gastro-esophageal reflux disease without esophagitis: Secondary | ICD-10-CM | POA: Diagnosis not present

## 2024-03-13 DIAGNOSIS — K5909 Other constipation: Secondary | ICD-10-CM | POA: Diagnosis not present

## 2024-03-13 DIAGNOSIS — D122 Benign neoplasm of ascending colon: Secondary | ICD-10-CM | POA: Diagnosis not present

## 2024-03-13 DIAGNOSIS — M810 Age-related osteoporosis without current pathological fracture: Secondary | ICD-10-CM | POA: Diagnosis not present

## 2024-03-13 DIAGNOSIS — M1990 Unspecified osteoarthritis, unspecified site: Secondary | ICD-10-CM | POA: Diagnosis not present

## 2024-03-13 DIAGNOSIS — I110 Hypertensive heart disease with heart failure: Secondary | ICD-10-CM | POA: Diagnosis not present

## 2024-03-13 DIAGNOSIS — J4489 Other specified chronic obstructive pulmonary disease: Secondary | ICD-10-CM | POA: Diagnosis not present

## 2024-03-13 DIAGNOSIS — E785 Hyperlipidemia, unspecified: Secondary | ICD-10-CM | POA: Diagnosis not present

## 2024-03-13 DIAGNOSIS — K635 Polyp of colon: Secondary | ICD-10-CM | POA: Diagnosis not present

## 2024-03-13 DIAGNOSIS — D509 Iron deficiency anemia, unspecified: Secondary | ICD-10-CM | POA: Diagnosis not present

## 2024-03-13 DIAGNOSIS — M26609 Unspecified temporomandibular joint disorder, unspecified side: Secondary | ICD-10-CM | POA: Diagnosis not present

## 2024-03-13 DIAGNOSIS — K5730 Diverticulosis of large intestine without perforation or abscess without bleeding: Secondary | ICD-10-CM | POA: Diagnosis not present

## 2024-03-13 DIAGNOSIS — J9611 Chronic respiratory failure with hypoxia: Secondary | ICD-10-CM | POA: Diagnosis not present

## 2024-03-13 DIAGNOSIS — D126 Benign neoplasm of colon, unspecified: Secondary | ICD-10-CM | POA: Diagnosis not present

## 2024-03-13 LAB — HM COLONOSCOPY

## 2024-03-15 DIAGNOSIS — J9611 Chronic respiratory failure with hypoxia: Secondary | ICD-10-CM | POA: Diagnosis not present

## 2024-03-15 DIAGNOSIS — J441 Chronic obstructive pulmonary disease with (acute) exacerbation: Secondary | ICD-10-CM | POA: Diagnosis not present

## 2024-03-15 DIAGNOSIS — I2510 Atherosclerotic heart disease of native coronary artery without angina pectoris: Secondary | ICD-10-CM | POA: Diagnosis not present

## 2024-03-17 DIAGNOSIS — J9611 Chronic respiratory failure with hypoxia: Secondary | ICD-10-CM | POA: Diagnosis not present

## 2024-03-22 DIAGNOSIS — J9611 Chronic respiratory failure with hypoxia: Secondary | ICD-10-CM | POA: Diagnosis not present

## 2024-03-22 DIAGNOSIS — J301 Allergic rhinitis due to pollen: Secondary | ICD-10-CM | POA: Diagnosis not present

## 2024-03-22 DIAGNOSIS — G4733 Obstructive sleep apnea (adult) (pediatric): Secondary | ICD-10-CM | POA: Diagnosis not present

## 2024-03-22 DIAGNOSIS — J441 Chronic obstructive pulmonary disease with (acute) exacerbation: Secondary | ICD-10-CM | POA: Diagnosis not present

## 2024-03-29 DIAGNOSIS — J9621 Acute and chronic respiratory failure with hypoxia: Secondary | ICD-10-CM | POA: Diagnosis not present

## 2024-03-29 DIAGNOSIS — E876 Hypokalemia: Secondary | ICD-10-CM | POA: Diagnosis not present

## 2024-03-29 DIAGNOSIS — D509 Iron deficiency anemia, unspecified: Secondary | ICD-10-CM | POA: Diagnosis not present

## 2024-03-29 DIAGNOSIS — E1165 Type 2 diabetes mellitus with hyperglycemia: Secondary | ICD-10-CM | POA: Diagnosis not present

## 2024-03-29 DIAGNOSIS — G9050 Complex regional pain syndrome I, unspecified: Secondary | ICD-10-CM | POA: Diagnosis not present

## 2024-03-29 DIAGNOSIS — D649 Anemia, unspecified: Secondary | ICD-10-CM | POA: Diagnosis not present

## 2024-03-29 DIAGNOSIS — N3281 Overactive bladder: Secondary | ICD-10-CM | POA: Diagnosis not present

## 2024-03-29 DIAGNOSIS — I4510 Unspecified right bundle-branch block: Secondary | ICD-10-CM | POA: Diagnosis not present

## 2024-03-29 DIAGNOSIS — D72829 Elevated white blood cell count, unspecified: Secondary | ICD-10-CM | POA: Diagnosis not present

## 2024-03-29 DIAGNOSIS — R0602 Shortness of breath: Secondary | ICD-10-CM | POA: Diagnosis not present

## 2024-03-29 DIAGNOSIS — J9622 Acute and chronic respiratory failure with hypercapnia: Secondary | ICD-10-CM | POA: Diagnosis not present

## 2024-03-29 DIAGNOSIS — Z8709 Personal history of other diseases of the respiratory system: Secondary | ICD-10-CM | POA: Diagnosis not present

## 2024-03-29 DIAGNOSIS — R230 Cyanosis: Secondary | ICD-10-CM | POA: Diagnosis not present

## 2024-03-29 DIAGNOSIS — J441 Chronic obstructive pulmonary disease with (acute) exacerbation: Secondary | ICD-10-CM | POA: Diagnosis not present

## 2024-03-29 DIAGNOSIS — I110 Hypertensive heart disease with heart failure: Secondary | ICD-10-CM | POA: Diagnosis not present

## 2024-03-29 DIAGNOSIS — G4700 Insomnia, unspecified: Secondary | ICD-10-CM | POA: Diagnosis not present

## 2024-03-29 DIAGNOSIS — R300 Dysuria: Secondary | ICD-10-CM | POA: Diagnosis not present

## 2024-03-30 DIAGNOSIS — J441 Chronic obstructive pulmonary disease with (acute) exacerbation: Secondary | ICD-10-CM | POA: Diagnosis not present

## 2024-03-30 DIAGNOSIS — E876 Hypokalemia: Secondary | ICD-10-CM | POA: Diagnosis not present

## 2024-03-30 DIAGNOSIS — Z87440 Personal history of urinary (tract) infections: Secondary | ICD-10-CM | POA: Diagnosis not present

## 2024-03-30 DIAGNOSIS — G90522 Complex regional pain syndrome I of left lower limb: Secondary | ICD-10-CM | POA: Diagnosis not present

## 2024-03-30 DIAGNOSIS — I509 Heart failure, unspecified: Secondary | ICD-10-CM | POA: Diagnosis not present

## 2024-03-30 DIAGNOSIS — L03119 Cellulitis of unspecified part of limb: Secondary | ICD-10-CM | POA: Diagnosis not present

## 2024-03-30 DIAGNOSIS — I480 Paroxysmal atrial fibrillation: Secondary | ICD-10-CM | POA: Diagnosis not present

## 2024-03-30 DIAGNOSIS — Z87891 Personal history of nicotine dependence: Secondary | ICD-10-CM | POA: Diagnosis not present

## 2024-03-30 DIAGNOSIS — N3281 Overactive bladder: Secondary | ICD-10-CM | POA: Diagnosis not present

## 2024-03-30 DIAGNOSIS — K219 Gastro-esophageal reflux disease without esophagitis: Secondary | ICD-10-CM | POA: Diagnosis not present

## 2024-03-30 DIAGNOSIS — J9621 Acute and chronic respiratory failure with hypoxia: Secondary | ICD-10-CM | POA: Diagnosis not present

## 2024-03-30 DIAGNOSIS — Z8711 Personal history of peptic ulcer disease: Secondary | ICD-10-CM | POA: Diagnosis not present

## 2024-03-30 DIAGNOSIS — E1165 Type 2 diabetes mellitus with hyperglycemia: Secondary | ICD-10-CM | POA: Diagnosis not present

## 2024-03-30 DIAGNOSIS — Z9981 Dependence on supplemental oxygen: Secondary | ICD-10-CM | POA: Diagnosis not present

## 2024-03-30 DIAGNOSIS — Z87442 Personal history of urinary calculi: Secondary | ICD-10-CM | POA: Diagnosis not present

## 2024-03-30 DIAGNOSIS — S92901A Unspecified fracture of right foot, initial encounter for closed fracture: Secondary | ICD-10-CM | POA: Diagnosis not present

## 2024-03-30 DIAGNOSIS — D72829 Elevated white blood cell count, unspecified: Secondary | ICD-10-CM | POA: Diagnosis not present

## 2024-03-30 DIAGNOSIS — I110 Hypertensive heart disease with heart failure: Secondary | ICD-10-CM | POA: Diagnosis not present

## 2024-03-30 DIAGNOSIS — E785 Hyperlipidemia, unspecified: Secondary | ICD-10-CM | POA: Diagnosis not present

## 2024-03-30 DIAGNOSIS — D649 Anemia, unspecified: Secondary | ICD-10-CM | POA: Diagnosis not present

## 2024-03-30 DIAGNOSIS — A419 Sepsis, unspecified organism: Secondary | ICD-10-CM | POA: Diagnosis not present

## 2024-03-30 DIAGNOSIS — Z89612 Acquired absence of left leg above knee: Secondary | ICD-10-CM | POA: Diagnosis not present

## 2024-03-30 DIAGNOSIS — Z880 Allergy status to penicillin: Secondary | ICD-10-CM | POA: Diagnosis not present

## 2024-03-30 DIAGNOSIS — J449 Chronic obstructive pulmonary disease, unspecified: Secondary | ICD-10-CM | POA: Diagnosis not present

## 2024-03-30 DIAGNOSIS — Z8673 Personal history of transient ischemic attack (TIA), and cerebral infarction without residual deficits: Secondary | ICD-10-CM | POA: Diagnosis not present

## 2024-03-30 DIAGNOSIS — M1990 Unspecified osteoarthritis, unspecified site: Secondary | ICD-10-CM | POA: Diagnosis not present

## 2024-03-30 DIAGNOSIS — J9622 Acute and chronic respiratory failure with hypercapnia: Secondary | ICD-10-CM | POA: Diagnosis not present

## 2024-03-31 DIAGNOSIS — A419 Sepsis, unspecified organism: Secondary | ICD-10-CM | POA: Diagnosis not present

## 2024-03-31 DIAGNOSIS — L03119 Cellulitis of unspecified part of limb: Secondary | ICD-10-CM | POA: Diagnosis not present

## 2024-03-31 DIAGNOSIS — S92901A Unspecified fracture of right foot, initial encounter for closed fracture: Secondary | ICD-10-CM | POA: Diagnosis not present

## 2024-04-03 DIAGNOSIS — J9622 Acute and chronic respiratory failure with hypercapnia: Secondary | ICD-10-CM | POA: Diagnosis not present

## 2024-04-03 DIAGNOSIS — N390 Urinary tract infection, site not specified: Secondary | ICD-10-CM | POA: Diagnosis not present

## 2024-04-03 DIAGNOSIS — N3281 Overactive bladder: Secondary | ICD-10-CM | POA: Diagnosis not present

## 2024-04-03 DIAGNOSIS — J441 Chronic obstructive pulmonary disease with (acute) exacerbation: Secondary | ICD-10-CM | POA: Diagnosis not present

## 2024-04-03 DIAGNOSIS — I509 Heart failure, unspecified: Secondary | ICD-10-CM | POA: Diagnosis not present

## 2024-04-03 DIAGNOSIS — I110 Hypertensive heart disease with heart failure: Secondary | ICD-10-CM | POA: Diagnosis not present

## 2024-04-03 DIAGNOSIS — I051 Rheumatic mitral insufficiency: Secondary | ICD-10-CM | POA: Diagnosis not present

## 2024-04-03 DIAGNOSIS — E1165 Type 2 diabetes mellitus with hyperglycemia: Secondary | ICD-10-CM | POA: Diagnosis not present

## 2024-04-03 DIAGNOSIS — J9621 Acute and chronic respiratory failure with hypoxia: Secondary | ICD-10-CM | POA: Diagnosis not present

## 2024-04-03 DIAGNOSIS — R911 Solitary pulmonary nodule: Secondary | ICD-10-CM | POA: Diagnosis not present

## 2024-04-03 DIAGNOSIS — M810 Age-related osteoporosis without current pathological fracture: Secondary | ICD-10-CM | POA: Diagnosis not present

## 2024-04-03 DIAGNOSIS — K219 Gastro-esophageal reflux disease without esophagitis: Secondary | ICD-10-CM | POA: Diagnosis not present

## 2024-04-03 DIAGNOSIS — I480 Paroxysmal atrial fibrillation: Secondary | ICD-10-CM | POA: Diagnosis not present

## 2024-04-03 DIAGNOSIS — E041 Nontoxic single thyroid nodule: Secondary | ICD-10-CM | POA: Diagnosis not present

## 2024-04-03 DIAGNOSIS — I700 Atherosclerosis of aorta: Secondary | ICD-10-CM | POA: Diagnosis not present

## 2024-04-03 DIAGNOSIS — G4700 Insomnia, unspecified: Secondary | ICD-10-CM | POA: Diagnosis not present

## 2024-04-03 DIAGNOSIS — K760 Fatty (change of) liver, not elsewhere classified: Secondary | ICD-10-CM | POA: Diagnosis not present

## 2024-04-03 DIAGNOSIS — I69354 Hemiplegia and hemiparesis following cerebral infarction affecting left non-dominant side: Secondary | ICD-10-CM | POA: Diagnosis not present

## 2024-04-03 DIAGNOSIS — E7800 Pure hypercholesterolemia, unspecified: Secondary | ICD-10-CM | POA: Diagnosis not present

## 2024-04-03 DIAGNOSIS — E1142 Type 2 diabetes mellitus with diabetic polyneuropathy: Secondary | ICD-10-CM | POA: Diagnosis not present

## 2024-04-03 DIAGNOSIS — D509 Iron deficiency anemia, unspecified: Secondary | ICD-10-CM | POA: Diagnosis not present

## 2024-04-03 DIAGNOSIS — G4733 Obstructive sleep apnea (adult) (pediatric): Secondary | ICD-10-CM | POA: Diagnosis not present

## 2024-04-03 DIAGNOSIS — M1990 Unspecified osteoarthritis, unspecified site: Secondary | ICD-10-CM | POA: Diagnosis not present

## 2024-04-07 DIAGNOSIS — G4733 Obstructive sleep apnea (adult) (pediatric): Secondary | ICD-10-CM | POA: Diagnosis not present

## 2024-04-09 DIAGNOSIS — J449 Chronic obstructive pulmonary disease, unspecified: Secondary | ICD-10-CM | POA: Diagnosis not present

## 2024-04-11 DIAGNOSIS — M1990 Unspecified osteoarthritis, unspecified site: Secondary | ICD-10-CM | POA: Diagnosis not present

## 2024-04-11 DIAGNOSIS — E1165 Type 2 diabetes mellitus with hyperglycemia: Secondary | ICD-10-CM | POA: Diagnosis not present

## 2024-04-11 DIAGNOSIS — I110 Hypertensive heart disease with heart failure: Secondary | ICD-10-CM | POA: Diagnosis not present

## 2024-04-11 DIAGNOSIS — I051 Rheumatic mitral insufficiency: Secondary | ICD-10-CM | POA: Diagnosis not present

## 2024-04-11 DIAGNOSIS — J9621 Acute and chronic respiratory failure with hypoxia: Secondary | ICD-10-CM | POA: Diagnosis not present

## 2024-04-11 DIAGNOSIS — G4700 Insomnia, unspecified: Secondary | ICD-10-CM | POA: Diagnosis not present

## 2024-04-11 DIAGNOSIS — K760 Fatty (change of) liver, not elsewhere classified: Secondary | ICD-10-CM | POA: Diagnosis not present

## 2024-04-11 DIAGNOSIS — E1142 Type 2 diabetes mellitus with diabetic polyneuropathy: Secondary | ICD-10-CM | POA: Diagnosis not present

## 2024-04-11 DIAGNOSIS — N390 Urinary tract infection, site not specified: Secondary | ICD-10-CM | POA: Diagnosis not present

## 2024-04-11 DIAGNOSIS — N3281 Overactive bladder: Secondary | ICD-10-CM | POA: Diagnosis not present

## 2024-04-11 DIAGNOSIS — M810 Age-related osteoporosis without current pathological fracture: Secondary | ICD-10-CM | POA: Diagnosis not present

## 2024-04-11 DIAGNOSIS — I69354 Hemiplegia and hemiparesis following cerebral infarction affecting left non-dominant side: Secondary | ICD-10-CM | POA: Diagnosis not present

## 2024-04-11 DIAGNOSIS — I509 Heart failure, unspecified: Secondary | ICD-10-CM | POA: Diagnosis not present

## 2024-04-11 DIAGNOSIS — R911 Solitary pulmonary nodule: Secondary | ICD-10-CM | POA: Diagnosis not present

## 2024-04-11 DIAGNOSIS — I700 Atherosclerosis of aorta: Secondary | ICD-10-CM | POA: Diagnosis not present

## 2024-04-11 DIAGNOSIS — D509 Iron deficiency anemia, unspecified: Secondary | ICD-10-CM | POA: Diagnosis not present

## 2024-04-11 DIAGNOSIS — G4733 Obstructive sleep apnea (adult) (pediatric): Secondary | ICD-10-CM | POA: Diagnosis not present

## 2024-04-11 DIAGNOSIS — K219 Gastro-esophageal reflux disease without esophagitis: Secondary | ICD-10-CM | POA: Diagnosis not present

## 2024-04-11 DIAGNOSIS — I480 Paroxysmal atrial fibrillation: Secondary | ICD-10-CM | POA: Diagnosis not present

## 2024-04-11 DIAGNOSIS — J9622 Acute and chronic respiratory failure with hypercapnia: Secondary | ICD-10-CM | POA: Diagnosis not present

## 2024-04-11 DIAGNOSIS — E7800 Pure hypercholesterolemia, unspecified: Secondary | ICD-10-CM | POA: Diagnosis not present

## 2024-04-11 DIAGNOSIS — E041 Nontoxic single thyroid nodule: Secondary | ICD-10-CM | POA: Diagnosis not present

## 2024-04-11 DIAGNOSIS — J441 Chronic obstructive pulmonary disease with (acute) exacerbation: Secondary | ICD-10-CM | POA: Diagnosis not present

## 2024-04-12 DIAGNOSIS — Z79899 Other long term (current) drug therapy: Secondary | ICD-10-CM | POA: Diagnosis not present

## 2024-04-12 DIAGNOSIS — J449 Chronic obstructive pulmonary disease, unspecified: Secondary | ICD-10-CM | POA: Diagnosis not present

## 2024-04-17 DIAGNOSIS — M1990 Unspecified osteoarthritis, unspecified site: Secondary | ICD-10-CM | POA: Diagnosis not present

## 2024-04-17 DIAGNOSIS — K219 Gastro-esophageal reflux disease without esophagitis: Secondary | ICD-10-CM | POA: Diagnosis not present

## 2024-04-17 DIAGNOSIS — I69354 Hemiplegia and hemiparesis following cerebral infarction affecting left non-dominant side: Secondary | ICD-10-CM | POA: Diagnosis not present

## 2024-04-17 DIAGNOSIS — G4733 Obstructive sleep apnea (adult) (pediatric): Secondary | ICD-10-CM | POA: Diagnosis not present

## 2024-04-17 DIAGNOSIS — I700 Atherosclerosis of aorta: Secondary | ICD-10-CM | POA: Diagnosis not present

## 2024-04-17 DIAGNOSIS — K760 Fatty (change of) liver, not elsewhere classified: Secondary | ICD-10-CM | POA: Diagnosis not present

## 2024-04-17 DIAGNOSIS — I051 Rheumatic mitral insufficiency: Secondary | ICD-10-CM | POA: Diagnosis not present

## 2024-04-17 DIAGNOSIS — G4700 Insomnia, unspecified: Secondary | ICD-10-CM | POA: Diagnosis not present

## 2024-04-17 DIAGNOSIS — E041 Nontoxic single thyroid nodule: Secondary | ICD-10-CM | POA: Diagnosis not present

## 2024-04-17 DIAGNOSIS — E7800 Pure hypercholesterolemia, unspecified: Secondary | ICD-10-CM | POA: Diagnosis not present

## 2024-04-17 DIAGNOSIS — J9622 Acute and chronic respiratory failure with hypercapnia: Secondary | ICD-10-CM | POA: Diagnosis not present

## 2024-04-17 DIAGNOSIS — M810 Age-related osteoporosis without current pathological fracture: Secondary | ICD-10-CM | POA: Diagnosis not present

## 2024-04-17 DIAGNOSIS — D509 Iron deficiency anemia, unspecified: Secondary | ICD-10-CM | POA: Diagnosis not present

## 2024-04-17 DIAGNOSIS — I480 Paroxysmal atrial fibrillation: Secondary | ICD-10-CM | POA: Diagnosis not present

## 2024-04-17 DIAGNOSIS — N3281 Overactive bladder: Secondary | ICD-10-CM | POA: Diagnosis not present

## 2024-04-17 DIAGNOSIS — I110 Hypertensive heart disease with heart failure: Secondary | ICD-10-CM | POA: Diagnosis not present

## 2024-04-17 DIAGNOSIS — I509 Heart failure, unspecified: Secondary | ICD-10-CM | POA: Diagnosis not present

## 2024-04-17 DIAGNOSIS — E1165 Type 2 diabetes mellitus with hyperglycemia: Secondary | ICD-10-CM | POA: Diagnosis not present

## 2024-04-17 DIAGNOSIS — J9621 Acute and chronic respiratory failure with hypoxia: Secondary | ICD-10-CM | POA: Diagnosis not present

## 2024-04-17 DIAGNOSIS — R911 Solitary pulmonary nodule: Secondary | ICD-10-CM | POA: Diagnosis not present

## 2024-04-17 DIAGNOSIS — N390 Urinary tract infection, site not specified: Secondary | ICD-10-CM | POA: Diagnosis not present

## 2024-04-17 DIAGNOSIS — J441 Chronic obstructive pulmonary disease with (acute) exacerbation: Secondary | ICD-10-CM | POA: Diagnosis not present

## 2024-04-17 DIAGNOSIS — E1142 Type 2 diabetes mellitus with diabetic polyneuropathy: Secondary | ICD-10-CM | POA: Diagnosis not present

## 2024-04-23 DIAGNOSIS — J9621 Acute and chronic respiratory failure with hypoxia: Secondary | ICD-10-CM | POA: Diagnosis not present

## 2024-04-23 DIAGNOSIS — G4733 Obstructive sleep apnea (adult) (pediatric): Secondary | ICD-10-CM | POA: Diagnosis not present

## 2024-04-23 DIAGNOSIS — I110 Hypertensive heart disease with heart failure: Secondary | ICD-10-CM | POA: Diagnosis not present

## 2024-04-23 DIAGNOSIS — M810 Age-related osteoporosis without current pathological fracture: Secondary | ICD-10-CM | POA: Diagnosis not present

## 2024-04-23 DIAGNOSIS — N3281 Overactive bladder: Secondary | ICD-10-CM | POA: Diagnosis not present

## 2024-04-23 DIAGNOSIS — E1142 Type 2 diabetes mellitus with diabetic polyneuropathy: Secondary | ICD-10-CM | POA: Diagnosis not present

## 2024-04-23 DIAGNOSIS — I480 Paroxysmal atrial fibrillation: Secondary | ICD-10-CM | POA: Diagnosis not present

## 2024-04-23 DIAGNOSIS — I051 Rheumatic mitral insufficiency: Secondary | ICD-10-CM | POA: Diagnosis not present

## 2024-04-23 DIAGNOSIS — G4700 Insomnia, unspecified: Secondary | ICD-10-CM | POA: Diagnosis not present

## 2024-04-23 DIAGNOSIS — E041 Nontoxic single thyroid nodule: Secondary | ICD-10-CM | POA: Diagnosis not present

## 2024-04-23 DIAGNOSIS — E7800 Pure hypercholesterolemia, unspecified: Secondary | ICD-10-CM | POA: Diagnosis not present

## 2024-04-23 DIAGNOSIS — K219 Gastro-esophageal reflux disease without esophagitis: Secondary | ICD-10-CM | POA: Diagnosis not present

## 2024-04-23 DIAGNOSIS — J441 Chronic obstructive pulmonary disease with (acute) exacerbation: Secondary | ICD-10-CM | POA: Diagnosis not present

## 2024-04-23 DIAGNOSIS — N390 Urinary tract infection, site not specified: Secondary | ICD-10-CM | POA: Diagnosis not present

## 2024-04-23 DIAGNOSIS — J9622 Acute and chronic respiratory failure with hypercapnia: Secondary | ICD-10-CM | POA: Diagnosis not present

## 2024-04-23 DIAGNOSIS — M1990 Unspecified osteoarthritis, unspecified site: Secondary | ICD-10-CM | POA: Diagnosis not present

## 2024-04-23 DIAGNOSIS — K760 Fatty (change of) liver, not elsewhere classified: Secondary | ICD-10-CM | POA: Diagnosis not present

## 2024-04-23 DIAGNOSIS — R911 Solitary pulmonary nodule: Secondary | ICD-10-CM | POA: Diagnosis not present

## 2024-04-23 DIAGNOSIS — I69354 Hemiplegia and hemiparesis following cerebral infarction affecting left non-dominant side: Secondary | ICD-10-CM | POA: Diagnosis not present

## 2024-04-23 DIAGNOSIS — D509 Iron deficiency anemia, unspecified: Secondary | ICD-10-CM | POA: Diagnosis not present

## 2024-04-23 DIAGNOSIS — E1165 Type 2 diabetes mellitus with hyperglycemia: Secondary | ICD-10-CM | POA: Diagnosis not present

## 2024-04-23 DIAGNOSIS — I509 Heart failure, unspecified: Secondary | ICD-10-CM | POA: Diagnosis not present

## 2024-04-23 DIAGNOSIS — I700 Atherosclerosis of aorta: Secondary | ICD-10-CM | POA: Diagnosis not present

## 2024-04-24 DIAGNOSIS — E785 Hyperlipidemia, unspecified: Secondary | ICD-10-CM | POA: Diagnosis not present

## 2024-04-24 DIAGNOSIS — I4891 Unspecified atrial fibrillation: Secondary | ICD-10-CM | POA: Diagnosis not present

## 2024-04-24 DIAGNOSIS — D509 Iron deficiency anemia, unspecified: Secondary | ICD-10-CM | POA: Diagnosis not present

## 2024-04-24 DIAGNOSIS — Z79899 Other long term (current) drug therapy: Secondary | ICD-10-CM | POA: Diagnosis not present

## 2024-04-29 DIAGNOSIS — N3281 Overactive bladder: Secondary | ICD-10-CM | POA: Diagnosis not present

## 2024-04-29 DIAGNOSIS — G4700 Insomnia, unspecified: Secondary | ICD-10-CM | POA: Diagnosis not present

## 2024-04-29 DIAGNOSIS — J449 Chronic obstructive pulmonary disease, unspecified: Secondary | ICD-10-CM | POA: Diagnosis not present

## 2024-04-30 DIAGNOSIS — J449 Chronic obstructive pulmonary disease, unspecified: Secondary | ICD-10-CM | POA: Diagnosis not present

## 2024-05-03 DIAGNOSIS — J9622 Acute and chronic respiratory failure with hypercapnia: Secondary | ICD-10-CM | POA: Diagnosis not present

## 2024-05-03 DIAGNOSIS — J9611 Chronic respiratory failure with hypoxia: Secondary | ICD-10-CM | POA: Diagnosis not present

## 2024-05-03 DIAGNOSIS — E7800 Pure hypercholesterolemia, unspecified: Secondary | ICD-10-CM | POA: Diagnosis not present

## 2024-05-03 DIAGNOSIS — E041 Nontoxic single thyroid nodule: Secondary | ICD-10-CM | POA: Diagnosis not present

## 2024-05-03 DIAGNOSIS — J441 Chronic obstructive pulmonary disease with (acute) exacerbation: Secondary | ICD-10-CM | POA: Diagnosis not present

## 2024-05-03 DIAGNOSIS — I69354 Hemiplegia and hemiparesis following cerebral infarction affecting left non-dominant side: Secondary | ICD-10-CM | POA: Diagnosis not present

## 2024-05-03 DIAGNOSIS — J9621 Acute and chronic respiratory failure with hypoxia: Secondary | ICD-10-CM | POA: Diagnosis not present

## 2024-05-03 DIAGNOSIS — K760 Fatty (change of) liver, not elsewhere classified: Secondary | ICD-10-CM | POA: Diagnosis not present

## 2024-05-03 DIAGNOSIS — D509 Iron deficiency anemia, unspecified: Secondary | ICD-10-CM | POA: Diagnosis not present

## 2024-05-03 DIAGNOSIS — R911 Solitary pulmonary nodule: Secondary | ICD-10-CM | POA: Diagnosis not present

## 2024-05-03 DIAGNOSIS — J301 Allergic rhinitis due to pollen: Secondary | ICD-10-CM | POA: Diagnosis not present

## 2024-05-03 DIAGNOSIS — I480 Paroxysmal atrial fibrillation: Secondary | ICD-10-CM | POA: Diagnosis not present

## 2024-05-03 DIAGNOSIS — I700 Atherosclerosis of aorta: Secondary | ICD-10-CM | POA: Diagnosis not present

## 2024-05-03 DIAGNOSIS — N390 Urinary tract infection, site not specified: Secondary | ICD-10-CM | POA: Diagnosis not present

## 2024-05-03 DIAGNOSIS — I509 Heart failure, unspecified: Secondary | ICD-10-CM | POA: Diagnosis not present

## 2024-05-03 DIAGNOSIS — N3281 Overactive bladder: Secondary | ICD-10-CM | POA: Diagnosis not present

## 2024-05-03 DIAGNOSIS — M810 Age-related osteoporosis without current pathological fracture: Secondary | ICD-10-CM | POA: Diagnosis not present

## 2024-05-03 DIAGNOSIS — E1165 Type 2 diabetes mellitus with hyperglycemia: Secondary | ICD-10-CM | POA: Diagnosis not present

## 2024-05-03 DIAGNOSIS — G4733 Obstructive sleep apnea (adult) (pediatric): Secondary | ICD-10-CM | POA: Diagnosis not present

## 2024-05-03 DIAGNOSIS — I051 Rheumatic mitral insufficiency: Secondary | ICD-10-CM | POA: Diagnosis not present

## 2024-05-03 DIAGNOSIS — E1142 Type 2 diabetes mellitus with diabetic polyneuropathy: Secondary | ICD-10-CM | POA: Diagnosis not present

## 2024-05-03 DIAGNOSIS — I110 Hypertensive heart disease with heart failure: Secondary | ICD-10-CM | POA: Diagnosis not present

## 2024-05-03 DIAGNOSIS — M1990 Unspecified osteoarthritis, unspecified site: Secondary | ICD-10-CM | POA: Diagnosis not present

## 2024-05-03 DIAGNOSIS — K219 Gastro-esophageal reflux disease without esophagitis: Secondary | ICD-10-CM | POA: Diagnosis not present

## 2024-05-03 DIAGNOSIS — G4700 Insomnia, unspecified: Secondary | ICD-10-CM | POA: Diagnosis not present

## 2024-05-04 DIAGNOSIS — F5101 Primary insomnia: Secondary | ICD-10-CM | POA: Diagnosis not present

## 2024-05-10 DIAGNOSIS — R627 Adult failure to thrive: Secondary | ICD-10-CM | POA: Diagnosis not present

## 2024-05-29 ENCOUNTER — Other Ambulatory Visit: Payer: Self-pay | Admitting: Hematology and Oncology

## 2024-05-29 ENCOUNTER — Inpatient Hospital Stay: Admitting: Hematology and Oncology

## 2024-05-29 ENCOUNTER — Inpatient Hospital Stay

## 2024-05-29 DIAGNOSIS — Z79899 Other long term (current) drug therapy: Secondary | ICD-10-CM | POA: Diagnosis not present

## 2024-05-29 DIAGNOSIS — K625 Hemorrhage of anus and rectum: Secondary | ICD-10-CM | POA: Diagnosis not present

## 2024-05-29 DIAGNOSIS — I110 Hypertensive heart disease with heart failure: Secondary | ICD-10-CM | POA: Diagnosis not present

## 2024-05-29 DIAGNOSIS — N200 Calculus of kidney: Secondary | ICD-10-CM | POA: Diagnosis not present

## 2024-05-29 DIAGNOSIS — K921 Melena: Secondary | ICD-10-CM | POA: Diagnosis not present

## 2024-05-29 DIAGNOSIS — K5900 Constipation, unspecified: Secondary | ICD-10-CM | POA: Diagnosis not present

## 2024-05-29 DIAGNOSIS — G4700 Insomnia, unspecified: Secondary | ICD-10-CM | POA: Diagnosis not present

## 2024-05-29 DIAGNOSIS — J449 Chronic obstructive pulmonary disease, unspecified: Secondary | ICD-10-CM | POA: Diagnosis not present

## 2024-05-29 DIAGNOSIS — D509 Iron deficiency anemia, unspecified: Secondary | ICD-10-CM

## 2024-05-29 DIAGNOSIS — R310 Gross hematuria: Secondary | ICD-10-CM | POA: Diagnosis not present

## 2024-05-29 DIAGNOSIS — Z8719 Personal history of other diseases of the digestive system: Secondary | ICD-10-CM | POA: Diagnosis not present

## 2024-05-29 DIAGNOSIS — I509 Heart failure, unspecified: Secondary | ICD-10-CM | POA: Diagnosis not present

## 2024-05-29 DIAGNOSIS — I491 Atrial premature depolarization: Secondary | ICD-10-CM | POA: Diagnosis not present

## 2024-05-29 DIAGNOSIS — I4891 Unspecified atrial fibrillation: Secondary | ICD-10-CM | POA: Diagnosis not present

## 2024-05-29 DIAGNOSIS — A419 Sepsis, unspecified organism: Secondary | ICD-10-CM | POA: Diagnosis not present

## 2024-05-29 DIAGNOSIS — Z7901 Long term (current) use of anticoagulants: Secondary | ICD-10-CM | POA: Diagnosis not present

## 2024-05-30 ENCOUNTER — Telehealth: Payer: Self-pay

## 2024-05-30 DIAGNOSIS — R319 Hematuria, unspecified: Secondary | ICD-10-CM | POA: Diagnosis not present

## 2024-05-30 DIAGNOSIS — N3281 Overactive bladder: Secondary | ICD-10-CM | POA: Diagnosis not present

## 2024-05-30 DIAGNOSIS — Z8719 Personal history of other diseases of the digestive system: Secondary | ICD-10-CM | POA: Diagnosis not present

## 2024-05-30 DIAGNOSIS — K922 Gastrointestinal hemorrhage, unspecified: Secondary | ICD-10-CM | POA: Diagnosis not present

## 2024-05-30 DIAGNOSIS — I5032 Chronic diastolic (congestive) heart failure: Secondary | ICD-10-CM | POA: Diagnosis not present

## 2024-05-30 DIAGNOSIS — Z7901 Long term (current) use of anticoagulants: Secondary | ICD-10-CM | POA: Diagnosis not present

## 2024-05-30 DIAGNOSIS — Z7951 Long term (current) use of inhaled steroids: Secondary | ICD-10-CM | POA: Diagnosis not present

## 2024-05-30 DIAGNOSIS — I4891 Unspecified atrial fibrillation: Secondary | ICD-10-CM | POA: Diagnosis not present

## 2024-05-30 DIAGNOSIS — J441 Chronic obstructive pulmonary disease with (acute) exacerbation: Secondary | ICD-10-CM | POA: Diagnosis not present

## 2024-05-30 DIAGNOSIS — Z89612 Acquired absence of left leg above knee: Secondary | ICD-10-CM | POA: Diagnosis not present

## 2024-05-30 DIAGNOSIS — I509 Heart failure, unspecified: Secondary | ICD-10-CM | POA: Diagnosis not present

## 2024-05-30 DIAGNOSIS — Z87891 Personal history of nicotine dependence: Secondary | ICD-10-CM | POA: Diagnosis not present

## 2024-05-30 DIAGNOSIS — Z885 Allergy status to narcotic agent status: Secondary | ICD-10-CM | POA: Diagnosis not present

## 2024-05-30 DIAGNOSIS — Z8673 Personal history of transient ischemic attack (TIA), and cerebral infarction without residual deficits: Secondary | ICD-10-CM | POA: Diagnosis not present

## 2024-05-30 DIAGNOSIS — R58 Hemorrhage, not elsewhere classified: Secondary | ICD-10-CM | POA: Diagnosis not present

## 2024-05-30 DIAGNOSIS — G4733 Obstructive sleep apnea (adult) (pediatric): Secondary | ICD-10-CM | POA: Diagnosis not present

## 2024-05-30 DIAGNOSIS — R310 Gross hematuria: Secondary | ICD-10-CM | POA: Diagnosis not present

## 2024-05-30 DIAGNOSIS — N200 Calculus of kidney: Secondary | ICD-10-CM | POA: Diagnosis not present

## 2024-05-30 DIAGNOSIS — I480 Paroxysmal atrial fibrillation: Secondary | ICD-10-CM | POA: Diagnosis not present

## 2024-05-30 DIAGNOSIS — Z743 Need for continuous supervision: Secondary | ICD-10-CM | POA: Diagnosis not present

## 2024-05-30 DIAGNOSIS — R531 Weakness: Secondary | ICD-10-CM | POA: Diagnosis not present

## 2024-05-30 DIAGNOSIS — E785 Hyperlipidemia, unspecified: Secondary | ICD-10-CM | POA: Diagnosis not present

## 2024-05-30 DIAGNOSIS — I110 Hypertensive heart disease with heart failure: Secondary | ICD-10-CM | POA: Diagnosis not present

## 2024-05-30 DIAGNOSIS — J449 Chronic obstructive pulmonary disease, unspecified: Secondary | ICD-10-CM | POA: Diagnosis not present

## 2024-05-30 DIAGNOSIS — Z7983 Long term (current) use of bisphosphonates: Secondary | ICD-10-CM | POA: Diagnosis not present

## 2024-05-30 DIAGNOSIS — K921 Melena: Secondary | ICD-10-CM | POA: Diagnosis not present

## 2024-05-30 DIAGNOSIS — K219 Gastro-esophageal reflux disease without esophagitis: Secondary | ICD-10-CM | POA: Diagnosis not present

## 2024-05-30 DIAGNOSIS — J988 Other specified respiratory disorders: Secondary | ICD-10-CM | POA: Diagnosis not present

## 2024-05-30 DIAGNOSIS — Z888 Allergy status to other drugs, medicaments and biological substances status: Secondary | ICD-10-CM | POA: Diagnosis not present

## 2024-05-30 DIAGNOSIS — Z79899 Other long term (current) drug therapy: Secondary | ICD-10-CM | POA: Diagnosis not present

## 2024-05-30 DIAGNOSIS — Z882 Allergy status to sulfonamides status: Secondary | ICD-10-CM | POA: Diagnosis not present

## 2024-05-30 DIAGNOSIS — K625 Hemorrhage of anus and rectum: Secondary | ICD-10-CM | POA: Diagnosis not present

## 2024-05-30 NOTE — Care Plan (Signed)
  Problem: Compromised Skin Integrity Goal: Skin integrity is maintained or improved Description: Assess and monitor skin integrity. Identify patients at risk for skin breakdown on admission and per policy. Collaborate with interdisciplinary team and initiate plans and interventions as needed.    Outcome: Progressing   Problem: Urinary Incontinence Goal: Perineal skin integrity is maintained or improved Description: Assess genitourinary system, perineal skin, labs (urinalysis), and history of incontinence to include past management, aggravating, and alleviating factors.  Collaborate with interdisciplinary team and initiate plans and interventions as needed. Outcome: Progressing

## 2024-05-30 NOTE — Progress Notes (Signed)
 Case Management Adult Assessment  CSN: [id number] DOB: 05-10-1956 Service: General Medicine Location: 734/01   Info & Contacts Assessment Completed: In-person interview with Patient The patient's status at this time is:: Able to communicate Prior to admission, patient resided at: Private residence Prior to admission, patient lived with : Spouse/Significant Other The patient's decision maker is:: Patient At discharge, will patient return to prior residence: Yes Barriers to education: No barriers   Extended Emergency Contact Information Primary Emergency Contact: Bourbon,Bobby Mobile Phone: [phone] Relation: Brother  Assessment Is this patient at baseline?: Yes Was patient independent with ADLs prior to admission?: No Was patient independent with mobility prior to admission?: No Does this patient have or need any DME?: Yes Patient's DME agency PTA: [employer] Patient has the following DME: Wheelchair, [employer], Bedside commode, Walker, Hospital bed (semi-electric), Shower chair, Oxygen  Does this patient have or need any home health services?: Yes Patient has the following home health services: CNA (1st care Aide 12 hours a day 5 days a week) Does this patient have or need any personal care services?: Yes (aide 12 hours a dy 5 days a week)   Social Does patient have a mental health diagnosis?: No Does patient have an acute substance use issue?: No   Discharge How will patient obtain prescription meds at discharge:: Medicare Part D Type of Payer Source:: Medicare How will this patient obtain follow-up care after discharge?: PCP office How will this patient reach the discharge destination?: Family/Friends Is this a Chronic Dialysis patient?: No Patient Discharge Goals of Care: Not to Return to the Hospital  Discharge Education Discharge Plan Discussed With:: Patient Education Readiness:: Acceptance Education Method:: Explanation Education Response:: Verbalizes Understanding                      Met with the patient in the room at the bedside She lives with her son Her brother In law provides transportation  She has DME at home including Hospital Bed, Wheelchair, [facility], RW, Shower chair, Oxygen  Provided by [employer] She has an aide Mon-Friday 630 AM until 630 PM thru a company call [employer] She has a PCP [place] F O'Buch, PA-C She is able to afford her medication and gets tham  at [facility]  She stated that this is her mobility baseline, she has had Home health services in the past and feels that it is not beneficial to her, she does not want HH again, she stated that her aide being there 12 hours a day assists her and that is all that she needs, she stated that she knows with her insurance [employer] Dual complete,  finding a HH agency is difficult and she does not want it   Case Management Coordination Status: Coordination Complete    Anticipated Discharge Location: Home  If Plan A discharging location is not feasible: Potential Plan B: Home  Deliliah J Louvet, RN

## 2024-05-30 NOTE — Telephone Encounter (Signed)
 Pt is currently admitted to [facility] with rectal bleed. Pt told them she had colonoscopy and EGD done earlier in the year per recommendation of our office. Melissa,NP, saw pt for iron  def anemia. Pt was only able to tell them that the result were negative. The staff needed to confirm the results as they are unable to see them on EPIC.  Colonoscopy - 2 polyps removed, both negative for high grade dysplasia.  EGD - small intestine bx - duodenal mucosa with preserved villoglandular architecture without increased intraepithelial lymphocytes or evidence of active inflammation. Clotest negative.

## 2024-05-30 NOTE — Progress Notes (Signed)
 Hospitalist Daily Progress Note     Date: 05/30/2024        Room: 734/01 Hospital Day: 0 Attending Physician: Jama Rome Louder, MD  Subjective / Interval History / ROS  Patient voices no new complaints.  She notes that by the afternoon, hematuria has significantly improved   Assessment and Plan  Amanda Lewis is a 68 y.o. female with a PMH, but not limited to, paroxysmal atrial fibrillation on chronic anticoagulation, COPD requiring 2 L via nasal cannula at baseline.  Patient was transferred from outside hospital due to suspected rectal bleeding.  Patient indicates that bleeding is not from GI source but rather associated with bladder.     Principal Problem:   GI bleed Active Problems:   Hx of AKA (above knee amputation), left (HCC)   Acute exacerbation of chronic obstructive pulmonary disease (COPD)    (CMD)   History of home oxygen  therapy   Chronic heart failure with preserved ejection fraction (HCC)   OSA (obstructive sleep apnea)   A-fib    (CMD)   CHF (congestive heart failure)    (CMD)   GERD (gastroesophageal reflux disease)   Hypertension   Gross hematuria   Hyperlipemia   Overactive bladder Resolved Problems:   * No resolved hospital problems. *   Hematuria anticoagulation was held and by the afternoon, patient's urine demonstrates no evidence of gross hematuria.  Etiology is unclear.  Urinalysis demonstrated leukocyte esterase and although patient has no urinary complaints, we will provide short antibiotic course.  Patient may benefit from urology evaluation with cystoscopy to evaluate for bladder polyps or malignancy that may contribute to hematuria.  Patient had reported a history of soaking night sweats a couple of times recently.  Gastrointestinal bleeding outside hospital had been concerned that patient's bleeding was resulting from GI source.  Patient reports no blood with defecation.  Continue to monitor and have a low  threshold to obtain CT angiogram versus GI consultation if gastrointestinal bleeding occurs  Possible acute exacerbation of COPD on exam, patient demonstrates wheezing.  She does not feel significant shortness of breath.  Wheezing may demonstrate Plan function.  If symptoms change or patient becomes hypoxic, we will consider more aggressive treatment  Atrial fibrillation on chronic anticoagulation rate control appears to be adequate with heart rate ranging from ranging from 74-89 bpm since coming to the hospital.  Anticoagulation is held due to hematuria  Heart failure with preserved ejection fraction she does not appear to be in acute exacerbation.  We will be cautious with fluid administration  Hypertension since coming to the hospital, patient's blood pressure has appeared to be well-controlled ranging from 134/66-135/63.  Continue current medication regimen and titrate to maintain normotensive state  Hyperlipidemia continue statin  Gastroesophageal reflux disease continue Prilosec       The primary contact is:  Extended Emergency Contact Information Primary Emergency Contact: Diltz,Bobby Mobile Phone: [phone] Relation: Brother   Code Status: Full Code    DVT ppx: SCD  Current Diet: Adult Diet- Regular   Discharge Planning  The current disposition plan is discharge to home in 2 to 3 days Barriers to discharge:  Discharge follow up needed:      Objective   Temp:  [98.1 F (36.7 C)] 98.1 F (36.7 C) Heart Rate:  [74-89] 89 Resp:  [16-18] 18 BP: (134-135)/(63-66) 135/63   Intake/Output Summary (Last 24 hours) at 05/30/2024 1613 Last data filed at 05/30/2024 1507 Gross per 24 hour  Intake 120 ml  Output  480 ml  Net -360 ml      General appearance -awake and alert.  Appears comfortable with no acute distress Mental status - answering questions appropriately, mood appropriate for situation Eyes - pupils equal and reactive, extraocular eye movements intact Mouth  - mucous membranes moist, pharynx normal without lesions Chest -scattered wheeze Heart - normal rate, regular rhythm, normal S1, S2, no murmurs, rubs, clicks or gallops Abdomen - soft, nontender, nondistended, no masses or organomegaly    Lines/Drains: Patient Lines/Drains/Airways Status     Active Peripherally inserted central catheter / Central venous catheter / Peripheral intravenous line / Drain / Airway / Intraosseous line / Epidural catheter / Arterial line / Line / Nasogastric/Orogastric Tube / Hemodialysis catheter / Umbilical venous catheter / Urethral Catheter / Intrauterine Pressure Catheter / Implantable Port / NHSN Urethral Catheter / NHSN Umbilical Catheter / Intentionally Retained Foreign Objects / AV Fistula / Peripheral Nerve Catheter     Name Placement date Placement time Site Days   Peripheral IV 05/30/24 Distal;Posterior;Right Forearm 05/30/24  0522  Forearm  less than 1   Female External Urinary Catheter 05/30/24  0522  -- less than 1             Labs and Results  I have reviewed the following labs and studies Lab Results  Component Value Date   WBC 5.34 05/30/2024   HGB 10.9 (L) 05/30/2024   HCT 34.5 (L) 05/30/2024   MCV 83.2 05/30/2024   PLT 179 05/30/2024   Lab Results  Component Value Date   GLUCOSE 85 05/30/2024   CALCIUM  9.6 05/30/2024   NA 143 05/30/2024   K 4.0 05/30/2024   CO2 36 (H) 05/30/2024   CL 102 05/30/2024   BUN 13 05/30/2024   CREATININE 0.54 (L) 05/30/2024   No results found for: ALT, AST, GGT, BILITOT No results found for: INR, PROTIME  Micro: No results found for this visit on 05/30/24 (from the past week).     Medications  Scheduled Medications: ARIPiprazole, 10 mg, oral, Daily atorvastatin , 80 mg, oral, QPM revefenacin, 175 mcg, nebulization, RDaily  And budesonide, 0.5 mg, nebulization, RBID  And formoterol, 20 mcg, nebulization, RBID cefTRIAXone , 2 g, intravenous, Q24H SCH dronedarone , 400 mg,  oral, BID DULoxetine , 60 mg, oral, BID ferrous sulfate, 325 mg, oral, Daily before breakfast gabapentin , 300 mg, oral, Q8H loratadine , 10 mg, oral, Daily melatonin, 3 mg, oral, At Bedtime omeprazole, 40 mg, oral, Daily polyethylene glycol, 17 g, oral, Daily QUEtiapine , 100 mg, oral, Q12H tamsulosin, 0.4 mg, oral, Daily traZODone, 100 mg, oral, Nightly   Prn Medications: .  acetaminophen  .  dextrose  .  dextrose  .  ondansetron  .  traMADoL  .  zolpidem Continuous Medications: .sodium chloride , 75 mL/hr, Last Rate: 75 mL/hr (05/30/24 0541)      Electronically signed by: Jama Rome Louder, MD 05/30/2024 4:13 PM

## 2024-05-30 NOTE — Progress Notes (Signed)
 ------------------------------------------------------------------------------- Attestation signed by Jama Rome Louder, MD at 05/30/2024  4:13 PM Note was dictated and reviewed for educational purposes -------------------------------------------------------------------------------                                               Hospitalist Daily Progress Note     Date: 05/30/2024        Room: 734/01 Hospital Day: 0 Attending Physician: Jama Rome Louder, MD  Subjective / Interval History / ROS  Patient is lying in bed. She is Aox3/3. She explains she first found blood in the toilet after using the bathroom two days ago. She was unable to quantify it, but feels that it was a lot of blood. She noted blood in the toilet water and some on the toilet paper but none mixed into her stools. She is unsure whether the blood is of GI or GU origin. Patient has a history of total hysterectomy. She denies any recent history of vomiting, abdominal pain, or melena. Denies urinary urgency or dysuria. Denies flank pain.  Patient reports experiencing a UTI about a month ago for which she was given antibiotics outpatient. According to patient this UTI cleared up with treatment.   She has been seeing hematology at [facility] for iron  deficiency anemia. Per their notes she had an EGD and colonoscopy completed in July 2025. Per [facility] triage nurse colonoscopy was within normal limits.   Patient reports 3 episodes of drenching night sweats in the last three weeks. She denies unintentional weight loss. She has a history of tobacco smoking (2 ppd) but quit around 20 years ago.   Patient denies current shortness of breath. She is on 2L, which is baseline for her.   Patient was seen and evaluated by PT/OT today, found to have mild limitations from baseline. Recommended home with nursing aid to assist with transfers.     Assessment and Plan  Amanda Lewis is a 68 y.o. female with past medical history of paroxysmal  atrial fibrillation on anticoagulation, COPD, chronic respiratory failure (2 L nasal cannula baseline) that was transferred from [facility] to [facility] with bright red blood per rectum.  Principal Problem:   GI bleed Active Problems:   Hx of AKA (above knee amputation), left (HCC)   COPD (chronic obstructive pulmonary disease) (HCC)   History of home oxygen  therapy   Chronic heart failure with preserved ejection fraction (HCC)   OSA (obstructive sleep apnea)   A-fib    (CMD)   CHF (congestive heart failure)    (CMD)   GERD (gastroesophageal reflux disease)   Hypertension   Gross hematuria Resolved Problems:   * No resolved hospital problems.    Assessment & Plan GI bleed -Hold home anticoagulation (eliquis  5mg ). -Colonoscopy from 03/13/24 within normal limits.  -Hgb currently stable around 11. Continue to monitor CBC Q6h. -Patient consented for blood transfusions if needed. -Ordered Hemoccult today.  -Working with [facility] to get results of colonoscopy conducted 02/2024. -Continue ferrous sulfate PO supplementation.  -Patient reports constipation at baseline (BM every 3-4 days). She takes Miralax  at home. Will continue Miralax .  Gross hematuria -Hold home anticoagulation (eliquis  5mg ). -Urinalysis today exhibits gross hematuria with bacteria and leukocyte esterase. Will start course of Rocephin  for UTI and monitor resolution of acute cystitis. If no improvement seen with antibiotic treatment consider Urology consultation for cystoscopy. Given history of night  sweats and tobacco use important to evaluate for malignancy.  -Urine culture ordered today, adjust antibiotic therapy as needed according to results.  Acute exacerbation of chronic obstructive pulmonary disease (COPD)    (CMD) History of home oxygen  therapy -Wheezing and increased respiratory effort on exam. Patient's O2 saturations currently stable, continue to monitor. If worsening will consider steroid administration.   -Patient currently on 2L, which is her home baseline.  -Patient uses Trelegy at home, continue therapy with revefenacin, budesonide, and performist while in hospital.  Hx of AKA (above knee amputation), left (HCC) -Continue home gabapentin  and tramadol  for pain.  A-fib    (CMD) -Holding home anticoagulation (eliquis  5mg ) due to current active bleeding.  -Continue home Multaq .  CHF (congestive heart failure)    (CMD) -Echo from 9/24 shows an EF of 60-65%. -No pitting edema, no pulmonary crackles on exam. No evidence of current exacerbation, will continue to monitor volume status.  GERD (gastroesophageal reflux disease) -Continue home prilosec. Hypertension -Patient's bp stable around 135/56. Want to avoid hypotension in the context of blood loss. Currently holding patient's home lasix  and hydrochlorothiazide . Hyperlipemia Continue statin.    The primary contact is:  Extended Emergency Contact Information Primary Emergency Contact: Ambrocio,Bobby Mobile Phone: [phone] Relation: Brother   Code Status: Full Code    DVT ppx: Chemical DVT prophylaxis contraindicated due to GI/GU bleed.   Current Diet:     Dietary Orders  (From admission, onward)               Ordered    Adult Diet- Regular  Diet effective now       References:    Medical Nutrition Management (MNM) for Registered Dietitian  Question Answer Comment  Diet type: Regular   Medical Nutrition Management By RD Yes, Medical Nutrition Management By RD      05/30/24 1228                  Objective   Temp:  [98.1 F (36.7 C)] 98.1 F (36.7 C) Heart Rate:  [74-89] 89 Resp:  [16-18] 18 BP: (134-135)/(63-66) 135/63   Intake/Output Summary (Last 24 hours) at 05/30/2024 1255 Last data filed at 05/30/2024 1044 Gross per 24 hour  Intake --  Output 180 ml  Net -180 ml      Physical Exam HENT:     Head: Normocephalic.  Pulmonary:     Breath sounds: Examination of the right-middle field reveals  wheezing. Examination of the right-lower field reveals wheezing. Decreased breath sounds and wheezing present.  Abdominal:     General: Abdomen is flat. There is no distension.     Palpations: Abdomen is soft.     Tenderness: There is no abdominal tenderness.  Musculoskeletal:     Right lower leg: No edema.  Neurological:     Mental Status: She is alert and oriented to person, place, and time.  Psychiatric:        Mood and Affect: Mood normal.        Behavior: Behavior normal.      Patient Lines/Drains/Airways Status     Active Peripherally inserted central catheter / Central venous catheter / Peripheral intravenous line / Drain / Airway / Intraosseous line / Epidural catheter / Arterial line / Line / Nasogastric/Orogastric Tube / Hemodialysis catheter / Umbilical venous catheter / Urethral Catheter / Intrauterine Pressure Catheter / Implantable Port / NHSN Urethral Catheter / NHSN Umbilical Catheter / Intentionally Retained Foreign Objects / AV Fistula / Peripheral Nerve Catheter  Name Placement date Placement time Site Days   Peripheral IV 05/30/24 Distal;Posterior;Right Forearm 05/30/24  0522  Forearm  less than 1   Female External Urinary Catheter 05/30/24  0522  -- less than 1             Labs and Results  I have reviewed the following labs and studies Lab Results  Component Value Date   WBC 5.36 05/30/2024   HGB 11.0 (L) 05/30/2024   HCT 34.2 (L) 05/30/2024   MCV 83.2 05/30/2024   PLT 172 05/30/2024   Lab Results  Component Value Date   GLUCOSE 85 05/30/2024   CALCIUM  9.6 05/30/2024   NA 143 05/30/2024   K 4.0 05/30/2024   CO2 36 (H) 05/30/2024   CL 102 05/30/2024   BUN 13 05/30/2024   CREATININE 0.54 (L) 05/30/2024   No results found for: ALT, AST, GGT, BILITOT No results found for: INR, PROTIME  Micro: No results found for this visit on 05/30/24 (from the past week).    Medications  Scheduled Medications: ARIPiprazole, 10 mg, oral,  Daily atorvastatin , 80 mg, oral, QPM revefenacin, 175 mcg, nebulization, RDaily  And budesonide, 0.5 mg, nebulization, RBID  And formoterol, 20 mcg, nebulization, RBID cefTRIAXone , 2 g, intravenous, Q24H SCH dronedarone , 400 mg, oral, BID DULoxetine , 60 mg, oral, BID ferrous sulfate, 325 mg, oral, Daily before breakfast gabapentin , 300 mg, oral, Q8H loratadine , 10 mg, oral, Daily melatonin, 3 mg, oral, At Bedtime omeprazole, 40 mg, oral, Daily QUEtiapine , 100 mg, oral, Q12H tamsulosin, 0.4 mg, oral, Daily traZODone, 100 mg, oral, Nightly   Prn Medications: .  acetaminophen  .  dextrose  .  dextrose  .  ondansetron  .  traMADoL  .  zolpidem Continuous Medications: .sodium chloride , 75 mL/hr, Last Rate: 75 mL/hr (05/30/24 0541)      Electronically signed by: Tinnie KANDICE Hurst, PA Student 05/30/2024 12:55 PM

## 2024-05-31 ENCOUNTER — Encounter: Payer: Self-pay | Admitting: Hematology and Oncology

## 2024-05-31 DIAGNOSIS — R195 Other fecal abnormalities: Secondary | ICD-10-CM | POA: Insufficient documentation

## 2024-05-31 DIAGNOSIS — I4891 Unspecified atrial fibrillation: Secondary | ICD-10-CM | POA: Insufficient documentation

## 2024-05-31 DIAGNOSIS — K219 Gastro-esophageal reflux disease without esophagitis: Secondary | ICD-10-CM | POA: Insufficient documentation

## 2024-05-31 DIAGNOSIS — M1990 Unspecified osteoarthritis, unspecified site: Secondary | ICD-10-CM | POA: Insufficient documentation

## 2024-05-31 DIAGNOSIS — F32A Depression, unspecified: Secondary | ICD-10-CM | POA: Insufficient documentation

## 2024-05-31 DIAGNOSIS — G4700 Insomnia, unspecified: Secondary | ICD-10-CM | POA: Insufficient documentation

## 2024-05-31 DIAGNOSIS — J189 Pneumonia, unspecified organism: Secondary | ICD-10-CM | POA: Insufficient documentation

## 2024-05-31 DIAGNOSIS — G8194 Hemiplegia, unspecified affecting left nondominant side: Secondary | ICD-10-CM | POA: Insufficient documentation

## 2024-05-31 DIAGNOSIS — D649 Anemia, unspecified: Secondary | ICD-10-CM | POA: Insufficient documentation

## 2024-05-31 DIAGNOSIS — J439 Emphysema, unspecified: Secondary | ICD-10-CM | POA: Insufficient documentation

## 2024-05-31 DIAGNOSIS — M75101 Unspecified rotator cuff tear or rupture of right shoulder, not specified as traumatic: Secondary | ICD-10-CM | POA: Insufficient documentation

## 2024-05-31 DIAGNOSIS — J069 Acute upper respiratory infection, unspecified: Secondary | ICD-10-CM | POA: Insufficient documentation

## 2024-05-31 DIAGNOSIS — Z87442 Personal history of urinary calculi: Secondary | ICD-10-CM | POA: Insufficient documentation

## 2024-05-31 DIAGNOSIS — G90529 Complex regional pain syndrome I of unspecified lower limb: Secondary | ICD-10-CM | POA: Insufficient documentation

## 2024-05-31 DIAGNOSIS — N343 Urethral syndrome, unspecified: Secondary | ICD-10-CM | POA: Insufficient documentation

## 2024-05-31 DIAGNOSIS — Z8669 Personal history of other diseases of the nervous system and sense organs: Secondary | ICD-10-CM | POA: Insufficient documentation

## 2024-05-31 DIAGNOSIS — K760 Fatty (change of) liver, not elsewhere classified: Secondary | ICD-10-CM | POA: Insufficient documentation

## 2024-05-31 NOTE — Care Plan (Signed)
Problem: Knowledge Deficit  Goal: Patient/family/caregiver demonstrates understanding of disease process, treatment plan, medications, and discharge instructions  Description: Complete learning assessment and assess knowledge base.  Outcome: Progressing     Problem: Compromised Skin Integrity  Goal: Skin integrity is maintained or improved  Description: Assess and monitor skin integrity. Identify patients at risk for skin breakdown on admission and per policy. Collaborate with interdisciplinary team and initiate plans and interventions as needed.        Outcome: Progressing  Goal: Fluid and electrolyte balance are achieved/maintained  Description: Assess and monitor vital signs (orthostatic vitals if applicable), fluid intake and output, urine color, labs, skin turgor, mucous membranes, jugular venous distention, edema, circumference of edematous extremities and abdominal girth, respiratory status, and mental status.  Monitor for signs and symptoms of hypovolemia (tachycardia, rapid breathing, decreased urine output, postural hypotension, confusion, syncope).  Monitor for signs and symptoms of hypervolemia (strong rapid pulse, shortness of breath, difficulty breathing lying down, crackles heard in lung fields, edema). Collaborate with interdisciplinary team and initiate plan and interventions as ordered.  Outcome: Progressing  Goal: Nutritional status is improving  Description: Monitor and assess patient for malnutrition (ex- brittle hair, bruises, dry skin, pale skin and conjunctiva, muscle wasting, smooth red tongue, and disorientation). Collaborate with interdisciplinary team and initiate plan and interventions as ordered.  Monitor patient's weight and dietary intake as ordered or per policy. Utilize nutrition screening tool and intervene per policy. Determine patient's food preferences and provide high-protein, high-caloric foods as appropriate.   Outcome: Progressing     Problem: Urinary Incontinence  Goal:  Perineal skin integrity is maintained or improved  Description: Assess genitourinary system, perineal skin, labs (urinalysis), and history of incontinence to include past management, aggravating, and alleviating factors.  Collaborate with interdisciplinary team and initiate plans and interventions as needed.  Outcome: Progressing

## 2024-06-01 ENCOUNTER — Ambulatory Visit: Admitting: Cardiology

## 2024-06-01 DIAGNOSIS — Z789 Other specified health status: Secondary | ICD-10-CM | POA: Insufficient documentation

## 2024-06-01 DIAGNOSIS — R319 Hematuria, unspecified: Secondary | ICD-10-CM | POA: Diagnosis not present

## 2024-06-02 DIAGNOSIS — R319 Hematuria, unspecified: Secondary | ICD-10-CM | POA: Diagnosis not present

## 2024-06-04 DIAGNOSIS — J449 Chronic obstructive pulmonary disease, unspecified: Secondary | ICD-10-CM | POA: Diagnosis not present

## 2024-06-04 DIAGNOSIS — G4733 Obstructive sleep apnea (adult) (pediatric): Secondary | ICD-10-CM | POA: Diagnosis not present

## 2024-06-04 DIAGNOSIS — J4540 Moderate persistent asthma, uncomplicated: Secondary | ICD-10-CM | POA: Diagnosis not present

## 2024-06-04 DIAGNOSIS — J9621 Acute and chronic respiratory failure with hypoxia: Secondary | ICD-10-CM | POA: Diagnosis not present

## 2024-06-05 ENCOUNTER — Telehealth: Payer: Self-pay | Admitting: Hematology and Oncology

## 2024-06-05 ENCOUNTER — Other Ambulatory Visit: Payer: Self-pay

## 2024-06-05 ENCOUNTER — Inpatient Hospital Stay (HOSPITAL_BASED_OUTPATIENT_CLINIC_OR_DEPARTMENT_OTHER): Admitting: Hematology and Oncology

## 2024-06-05 ENCOUNTER — Inpatient Hospital Stay: Attending: Hematology and Oncology

## 2024-06-05 VITALS — BP 129/66 | HR 83 | Temp 98.0°F | Resp 18

## 2024-06-05 DIAGNOSIS — Z79899 Other long term (current) drug therapy: Secondary | ICD-10-CM | POA: Diagnosis not present

## 2024-06-05 DIAGNOSIS — D509 Iron deficiency anemia, unspecified: Secondary | ICD-10-CM | POA: Insufficient documentation

## 2024-06-05 LAB — CBC WITH DIFFERENTIAL (CANCER CENTER ONLY)
Abs Immature Granulocytes: 0 K/uL (ref 0.00–0.07)
Basophils Absolute: 0 K/uL (ref 0.0–0.1)
Basophils Relative: 1 %
Eosinophils Absolute: 0.3 K/uL (ref 0.0–0.5)
Eosinophils Relative: 5 %
HCT: 35.8 % — ABNORMAL LOW (ref 36.0–46.0)
Hemoglobin: 10.8 g/dL — ABNORMAL LOW (ref 12.0–15.0)
Immature Granulocytes: 0 %
Lymphocytes Relative: 28 %
Lymphs Abs: 1.5 K/uL (ref 0.7–4.0)
MCH: 26.7 pg (ref 26.0–34.0)
MCHC: 30.2 g/dL (ref 30.0–36.0)
MCV: 88.4 fL (ref 80.0–100.0)
Monocytes Absolute: 0.3 K/uL (ref 0.1–1.0)
Monocytes Relative: 5 %
Neutro Abs: 3.4 K/uL (ref 1.7–7.7)
Neutrophils Relative %: 61 %
Platelet Count: 206 K/uL (ref 150–400)
RBC: 4.05 MIL/uL (ref 3.87–5.11)
RDW: 16.2 % — ABNORMAL HIGH (ref 11.5–15.5)
WBC Count: 5.5 K/uL (ref 4.0–10.5)
nRBC: 0 % (ref 0.0–0.2)

## 2024-06-05 LAB — CMP (CANCER CENTER ONLY)
ALT: 12 U/L (ref 0–44)
AST: 17 U/L (ref 15–41)
Albumin: 3.7 g/dL (ref 3.5–5.0)
Alkaline Phosphatase: 87 U/L (ref 38–126)
Anion gap: 9 (ref 5–15)
BUN: 12 mg/dL (ref 8–23)
CO2: 33 mmol/L — ABNORMAL HIGH (ref 22–32)
Calcium: 10 mg/dL (ref 8.9–10.3)
Chloride: 103 mmol/L (ref 98–111)
Creatinine: 0.67 mg/dL (ref 0.44–1.00)
GFR, Estimated: >60 mL/min (ref >60)
Glucose, Bld: 134 mg/dL — ABNORMAL HIGH (ref 70–99)
Potassium: 4 mmol/L (ref 3.5–5.1)
Sodium: 144 mmol/L (ref 135–145)
Total Bilirubin: 0.5 mg/dL (ref 0.0–1.2)
Total Protein: 7 g/dL (ref 6.5–8.1)

## 2024-06-05 LAB — FERRITIN: Ferritin: 47 ng/mL (ref 11–307)

## 2024-06-05 LAB — TSH: TSH: 2.03 u[IU]/mL (ref 0.350–4.500)

## 2024-06-05 LAB — IRON AND TIBC
Iron: 41 ug/dL (ref 28–170)
Saturation Ratios: 10 % — ABNORMAL LOW (ref 10.4–31.8)
TIBC: 413 ug/dL (ref 250–450)
UIBC: 372 ug/dL

## 2024-06-05 LAB — FOLATE: Folate: 5.2 ng/mL — ABNORMAL LOW (ref >5.9)

## 2024-06-05 LAB — VITAMIN B12: Vitamin B-12: 324 pg/mL (ref 180–914)

## 2024-06-05 NOTE — Telephone Encounter (Signed)
 Patient has been scheduled for follow-up visit per 06/05/24 LOS.  Pt given an appt calendar with date and time.

## 2024-06-07 DIAGNOSIS — G4733 Obstructive sleep apnea (adult) (pediatric): Secondary | ICD-10-CM | POA: Diagnosis not present

## 2024-06-07 DIAGNOSIS — J9611 Chronic respiratory failure with hypoxia: Secondary | ICD-10-CM | POA: Diagnosis not present

## 2024-06-07 DIAGNOSIS — R319 Hematuria, unspecified: Secondary | ICD-10-CM | POA: Diagnosis not present

## 2024-06-07 DIAGNOSIS — Z79899 Other long term (current) drug therapy: Secondary | ICD-10-CM | POA: Diagnosis not present

## 2024-06-07 DIAGNOSIS — Z23 Encounter for immunization: Secondary | ICD-10-CM | POA: Diagnosis not present

## 2024-06-29 DIAGNOSIS — M2550 Pain in unspecified joint: Secondary | ICD-10-CM | POA: Diagnosis not present

## 2024-06-29 DIAGNOSIS — Z79899 Other long term (current) drug therapy: Secondary | ICD-10-CM | POA: Diagnosis not present

## 2024-06-29 DIAGNOSIS — I10 Essential (primary) hypertension: Secondary | ICD-10-CM | POA: Diagnosis not present

## 2024-06-29 DIAGNOSIS — J449 Chronic obstructive pulmonary disease, unspecified: Secondary | ICD-10-CM | POA: Diagnosis not present

## 2024-06-29 DIAGNOSIS — I509 Heart failure, unspecified: Secondary | ICD-10-CM | POA: Diagnosis not present

## 2024-06-29 DIAGNOSIS — G4700 Insomnia, unspecified: Secondary | ICD-10-CM | POA: Diagnosis not present

## 2024-06-30 DIAGNOSIS — J449 Chronic obstructive pulmonary disease, unspecified: Secondary | ICD-10-CM | POA: Diagnosis not present

## 2024-07-05 ENCOUNTER — Encounter: Payer: Self-pay | Admitting: Hematology and Oncology

## 2024-07-05 NOTE — Progress Notes (Signed)
 [facility] [address] [place],  [place]  [address] [phone]  Clinic Day:  07/05/2024   Referring physician: O'Buch, Greta, PA-C  Patient Care Team: Patient Care Team: O'Buch, Greta, PA-C as PCP - General (Internal Medicine) Harl Eleanor LABOR, NP as Nurse Practitioner (Hematology and Oncology)   REASON FOR CONSULTATION:  Iron  deficiency anemia  HISTORY OF PRESENT ILLNESS:  Amanda Lewis is a 68 y.o. female with a history of iron  deficiency anemia who is referred in consultation by Glenis Olive, PA-C for assessment and management. She has a history of requiring multiple blood transfusions over the last year. She states she used to get iron  injections years ago and was then switched to oral iron . Her most recent transfusion occurred on 03/21 after a follow up with cardiology for atrial fibrillation. She feels better after infusions; however, it doesn't last long before she is fatigued again. She appears very pale today and complains of severe fatigue. She has a AKA of the left lower extremity and reports increased difficulty transferring from her wheelchair. She denies fever, chills, nausea or vomiting. She does have chronic respiratory conditions and wears oxygen . She does not note an increase to shortness of breath, cough or chest pain. She complains of ongoing constipation. She does not report any blood in her stools. Medical history is significant for atrial fibrillation, incontinence, carpal tunnel syndrome, fatty liver, insomnia, chronic hypoxemic respiratory failure,  left hemiplegia, overactive bladder, AKA of the left lower extremity, hypertension, sleep apnea and stroke. Surgical history consists of hysterectomy, appendectomy, amputation of the left lower extremity as well as revision of that amputation. Family history consists of heart disease, arthritis, myocardial infarction, hyperlipidemia, seizures, hypothyroidism, hypertension and diabetes mellitus. She is  a former smoker having quit 15 years ago.  REVIEW OF SYSTEMS:  Review of Systems  Constitutional:  Positive for fatigue.  HENT:  Negative.    Eyes: Negative.   Respiratory:  Positive for shortness of breath.   Cardiovascular: Negative.   Gastrointestinal:  Positive for constipation.  Genitourinary:  Positive for bladder incontinence.   Musculoskeletal:  Positive for gait problem.  Skin: Negative.   Neurological:  Positive for gait problem and numbness.  Hematological: Negative.   Psychiatric/Behavioral:  Positive for sleep disturbance.      VITALS:  Blood pressure 129/66, pulse 83, temperature 98 F (36.7 C), temperature source Oral, resp. rate 18, SpO2 93%.  Wt Readings from Last 3 Encounters:  02/27/24 197 lb (89.4 kg)  12/29/23 198 lb (89.8 kg)  11/18/23 198 lb (89.8 kg)    There is no height or weight on file to calculate BMI.  Performance status (ECOG): 2 - Symptomatic, <50% confined to bed  PHYSICAL EXAM:  Physical Exam Constitutional:      Appearance: Normal appearance. She is normal weight.  HENT:     Head: Normocephalic and atraumatic.     Mouth/Throat:     Mouth: Mucous membranes are moist.  Cardiovascular:     Rate and Rhythm: Normal rate and regular rhythm.  Pulmonary:     Effort: Pulmonary effort is normal.     Breath sounds: Normal breath sounds.  Musculoskeletal:        General: Normal range of motion.     Cervical back: Normal range of motion.     Comments: AKA left lower extremity   Skin:    General: Skin is warm and dry.  Neurological:     General: No focal deficit present.  Mental Status: She is alert and oriented to person, place, and time. Mental status is at baseline.     Gait: Gait abnormal.  Psychiatric:        Mood and Affect: Mood normal.        Behavior: Behavior normal.        Thought Content: Thought content normal.        Judgment: Judgment normal.      LABS:      Latest Ref Rng & Units 06/05/2024   10:49 AM 02/27/2024    10:22 AM 02/10/2024   10:47 AM  CBC  WBC 4.0 - 10.5 K/uL 5.5  7.9  9.1   Hemoglobin 12.0 - 15.0 g/dL 89.1  89.4  89.8   Hematocrit 36.0 - 46.0 % 35.8  36.9  34.2   Platelets 150 - 400 K/uL 206  218  285       Latest Ref Rng & Units 06/05/2024   10:49 AM 12/29/2023   10:19 AM 12/27/2020    3:56 AM  CMP  Glucose 70 - 99 mg/dL 865  861  98   BUN 8 - 23 mg/dL 12  11  10    Creatinine 0.44 - 1.00 mg/dL 9.32  9.37  9.52   Sodium 135 - 145 mmol/L 144  142  142   Potassium 3.5 - 5.1 mmol/L 4.0  3.3  3.7   Chloride 98 - 111 mmol/L 103  97  110   CO2 22 - 32 mmol/L 33  36  26   Calcium  8.9 - 10.3 mg/dL 89.9  9.6  9.8   Total Protein 6.5 - 8.1 g/dL 7.0  7.1    Total Bilirubin 0.0 - 1.2 mg/dL 0.5  0.4    Alkaline Phos 38 - 126 U/L 87  93    AST 15 - 41 U/L 17  20    ALT 0 - 44 U/L 12  15       No results found for: CEA1, CEA / No results found for: CEA1, CEA No results found for: PSA1 No results found for: CAN199 No results found for: CAN125  No results found for: TOTALPROTELP, ALBUMINELP, A1GS, A2GS, BETS, BETA2SER, GAMS, MSPIKE, SPEI Lab Results  Component Value Date   TIBC 413 06/05/2024   TIBC 428 02/10/2024   TIBC 552 (H) 12/29/2023   FERRITIN 47 06/05/2024   FERRITIN 17 02/10/2024   FERRITIN 4 (L) 12/29/2023   IRONPCTSAT 10 (L) 06/05/2024   IRONPCTSAT 6 (L) 02/10/2024   IRONPCTSAT 4 (L) 12/29/2023   No results found for: LDH  STUDIES:  No results found.    HISTORY:   Past Medical History:  Diagnosis Date   A-fib (HCC)    Anemia    Anxiety    on treatment   Arthritis    arms   Chronic heart failure with preserved ejection fraction (HCC) 07/18/2018   Last Assessment & Plan:   Formatting of this note might be different from the original.  TTE (11/18): EF 65%, indeterminate filling   -continue lasix  and HCTZ     Chronic lower back pain    Community acquired bacterial pneumonia 07/20/2018   Complex regional pain syndrome of left  lower extremity 01/13/2012   Constipation 07/20/2018   Last Assessment & Plan:  Formatting of this note might be different from the original. -continue senokot and miralax  -added suppository and sorbitol today  -order KUB if develops abd pain/n/v   COPD (chronic obstructive pulmonary disease) (HCC)  usesO2 24/7   COPD with acute exacerbation (HCC) 12/24/2020   Depression    DNR (do not resuscitate)    Dysuria-frequency syndrome    Emphysema of lung (HCC)    Fatty liver    GERD (gastroesophageal reflux disease)    History of CVA (cerebrovascular accident) 07/18/2018   Last Assessment & Plan:  Formatting of this note might be different from the original. -not on antiplatelet therapy currently - has allergy (urticaria/hives) to aspirin   -would consider starting plavix monotherapy for secondary stroke prevention after completing DVT prophylaxis  -continue statin   History of encephalopathy    History of home oxygen  therapy 07/18/2018   History of kidney stones    Insomnia    Iron  deficiency anemia 12/29/2023   Left acetabular fracture (HCC) 07/18/2018   Last Assessment & Plan:  Formatting of this note might be different from the original. -ortho plans for non operative management -Management per ortho primary team: PT/OT, wound care, weight bearing status and pain control -PT/OT rec SNF vs progression to home with HHPT   Left hemiplegia (HCC)    Left upper quadrant pain 07/18/2018   Last Assessment & Plan:  Formatting of this note might be different from the original. Lipase neg 2/2 8th rib fracture   -lidocaine  patch  -encouraged IS   Leukocytosis 07/18/2018   Last Assessment & Plan:  Formatting of this note is different from the original. Lab Results  Component Value Date   WBC 14.9 (H) 07/20/2018  CXR and UA normal   -likely reactive, may be developing PNA -will start doxycycline -repeat CBC tomorrow   Major depression 07/18/2018   Last Assessment & Plan:  Formatting of this note might be  different from the original. QTc 438 11/19  -continue home lexapro , wellbutrin, duloxetine , seroquel , and trazodone  -continue diazepam  prn  -counseled her on polypharmacy and asking her PCP to try to wean her off some of these medications   Multiple thyroid  nodules    Paroxysmal atrial fibrillation (HCC) 01/18/2022   Pneumonia    Positive colorectal cancer screening using Cologuard test    Recurrent upper respiratory infection (URI)    Rotator cuff tear, right    RSD lower limb    left lower leg   S/P AKA (above knee amputation) (HCC) 07/19/2015   Sleep apnea 2010   uses continous oxygen    Stroke [facility]) 1997    Past Surgical History:  Procedure Laterality Date   ABDOMINAL HYSTERECTOMY  08/30/2001   AMPUTATION  01/12/2012   Procedure: AMPUTATION ABOVE KNEE;  Surgeon: Jerona LULLA Sage, MD;  Location: [facility] OR;  Service: Orthopedics;  Laterality: Left;  Left Above Knee Amputation   APPENDECTOMY     BIOPSY THYROID      FRACTURE SURGERY Left    Hip   Morphine  Pump     Morphine  Pump Removed  08/30/2004   ORIF ANKLE FRACTURE  08/30/1993   STUMP REVISION Left 07/19/2015   Procedure: Revision Left Above Knee Amputation;  Surgeon: Jerona Sage LULLA, MD;  Location: [facility] OR;  Service: Orthopedics;  Laterality: Left;    Family History  Problem Relation Age of Onset   Diabetes Mother    Hypertension Mother    Heart disease Mother    Arthritis Mother    Hyperlipidemia Mother    Hypothyroidism Mother    Diabetes Father    Hypertension Father    Hyperlipidemia Father    Arthritis Father    Heart attack Father    Heart  attack Sister    Seizures Sister    Kidney disease Sister    Heart attack Brother    Arthritis Brother    Anesthesia problems Neg Hx     Social History:  reports that she quit smoking about 15 years ago. Her smoking use included cigarettes. She started smoking about 50 years ago. She has a 70 pack-year smoking history. She has never used smokeless tobacco. She reports that she  does not drink alcohol and does not use drugs.The patient is accompanied by daughter today.  Allergies:  Allergies  Allergen Reactions   Aspirin  Hives   Bactrim [Sulfamethoxazole-Trimethoprim] Hives   Nitroglycerin Other (See Comments)    Blood pressure drops dangerously low   Oxycodone -Aspirin  Hives    Takes percocet without problems   Sulfamethoxazole-Trimethoprim Hives   Tape Rash    Adhesive Adhesive    Current Medications: Current Outpatient Medications  Medication Sig Dispense Refill   acetaZOLAMIDE  (DIAMOX ) 500 MG capsule Take 1 capsule (500 mg total) by mouth daily. 20 capsule 0   albuterol  (PROVENTIL  HFA;VENTOLIN  HFA) 108 (90 BASE) MCG/ACT inhaler Inhale 2 puffs into the lungs every 6 (six) hours as needed for wheezing or shortness of breath. For shortness of breath     alendronate (FOSAMAX) 70 MG tablet Take 70 mg by mouth once a week.     ARIPiprazole (ABILIFY) 10 MG tablet Take 10 mg by mouth daily.     atorvastatin  (LIPITOR) 80 MG tablet Take 80 mg by mouth daily.     azithromycin  (ZITHROMAX ) 500 MG tablet Take 500 mg by mouth.     Calcium  Carbonate-Vitamin D (CALTRATE 600+D PO) Take by mouth.     clonazePAM (KLONOPIN) 0.5 MG tablet Take 0.5 mg by mouth daily as needed.     cromolyn (OPTICROM) 4 % ophthalmic solution 4 (four) times daily.     cyclobenzaprine  (FLEXERIL ) 5 MG tablet Take 5 mg by mouth 2 (two) times daily as needed for spasms.     diltiazem  (CARDIZEM  CD) 120 MG 24 hr capsule Take 1 capsule (120 mg total) by mouth daily. 90 capsule 0   dronedarone  (MULTAQ ) 400 MG tablet Take 1 tablet (400 mg total) by mouth 2 (two) times daily. 180 tablet 2   ELIQUIS  5 MG TABS tablet TAKE 1 TABLET BY MOUTH TWICE A DAY 180 tablet 1   ferrous sulfate 325 (65 FE) MG EC tablet Take 1 tablet by mouth daily.     FLUoxetine (PROZAC) 20 MG capsule Take 20 mg by mouth every morning.     fluticasone  (FLONASE ) 50 MCG/ACT nasal spray Place 2 sprays into both nostrils daily.      furosemide  (LASIX ) 40 MG tablet Take 40 mg by mouth daily.     gabapentin  (NEURONTIN ) 400 MG capsule Take 400 mg by mouth 3 (three) times daily.     lidocaine  (LIDODERM ) 5 % Place onto the skin daily.     loratadine  (CLARITIN ) 10 MG tablet Take 10 mg by mouth daily.     melatonin 3 MG TABS tablet Take 3 mg by mouth at bedtime.     MYRBETRIQ 50 MG TB24 tablet Take 50 mg by mouth daily.     OHTUVAYRE  3 MG/2.5ML SUSP      OXYGEN  Inhale 2 L into the lungs.     pantoprazole  (PROTONIX ) 40 MG tablet Take 40 mg by mouth daily.     Polyethylene Glycol 3350  (MIRALAX  PO) Take by mouth.     potassium chloride (MICRO-K) 10 MEQ  CR capsule Take 10 mEq by mouth daily.     tamsulosin (FLOMAX) 0.4 MG CAPS capsule Take 0.4 mg by mouth daily.     traMADol  (ULTRAM ) 50 MG tablet Take 50 mg by mouth 3 (three) times daily as needed.     traZODone (DESYREL) 100 MG tablet Take 100 mg by mouth at bedtime.     TRELEGY ELLIPTA 100-62.5-25 MCG/INH AEPB Inhale 1 puff into the lungs daily.     No current facility-administered medications for this visit.     ASSESSMENT & PLAN:   Assessment:  Amanda Lewis is a 68 y.o. female with history of iron  deficiency anemia. She has been on oral iron  for several years and iron  injections prior to that. Most recently she has required blood transfusions due to her anemia with the most recent being 03/21. She received iron  infusions with the last being 4 weeks ago. Today, CBC reveals Hgb 10.8 increased from 7.8 prior to infusions. Symptoms are finally improving since iron  infusions.   Plan: 1.  Return to clinic in 3 months.   I discussed the assessment and treatment plan with the patient.  The patient was provided an opportunity to ask questions and all were answered.  The patient agreed with the plan and demonstrated an understanding of the instructions.    Thank you for the referral    30 minutes was spent in patient care.  This included time spent preparing to see the patient  (e.g., review of tests), obtaining and/or reviewing separately obtained history, counseling and educating the patient/family/caregiver, ordering medications, tests, or procedures; documenting clinical information in the electronic or other health record, independently interpreting results and communicating results to the patient/family/caregiver as well as coordination of care.      Eleanor DELENA Bach, NP   Family Nurse Practitioner - Board Certified [facility] [phone]

## 2024-07-13 ENCOUNTER — Telehealth: Payer: Self-pay | Admitting: Cardiology

## 2024-07-13 NOTE — Telephone Encounter (Signed)
*  STAT* If patient is at the pharmacy, call can be transferred to refill team.   1. Which medications need to be refilled? (please list name of each medication and dose if known) diltiazem  (CARDIZEM  CD) 120 MG 24 hr capsule    2. Would you like to learn more about the convenience, safety, & potential cost savings by using the [facility]?     3. Are you open to using the [facility] (Type [facility].  ).   4. Which pharmacy/location (including street and city if local pharmacy) is medication to be sent to?  [id number] - [place], [place] - [address]     5. Do they need a 30 day or 90 day supply? 90 day

## 2024-07-13 NOTE — Telephone Encounter (Signed)
 Patient is following up as she has been completely out of medication for 2 days. Please advise.

## 2024-07-16 MED ORDER — DILTIAZEM HCL ER COATED BEADS 120 MG PO CP24: 120.0000 mg | ORAL_CAPSULE | Freq: Every day | ORAL | 1 refills | Status: DC

## 2024-07-16 NOTE — Telephone Encounter (Signed)
 Patient's anemia significantly improved with iron  infusions. Ok to continue dilitazem.

## 2024-07-17 ENCOUNTER — Other Ambulatory Visit: Payer: Self-pay

## 2024-07-17 MED ORDER — DILTIAZEM HCL ER COATED BEADS 120 MG PO CP24: 120.0000 mg | ORAL_CAPSULE | Freq: Every day | ORAL | 1 refills | Status: AC

## 2024-07-19 NOTE — Telephone Encounter (Signed)
 Spoke with pt. She is aware to continue Diltiazem  and has gotten a refill

## 2024-08-20 ENCOUNTER — Encounter: Payer: Self-pay | Admitting: *Deleted

## 2024-09-03 ENCOUNTER — Encounter: Payer: Self-pay | Admitting: Cardiology

## 2024-09-03 ENCOUNTER — Ambulatory Visit: Attending: Cardiology | Admitting: Cardiology

## 2024-09-03 ENCOUNTER — Encounter: Payer: Self-pay | Admitting: Hematology and Oncology

## 2024-09-03 VITALS — BP 140/60 | HR 85 | Ht 64.0 in | Wt 198.0 lb

## 2024-09-03 DIAGNOSIS — G8194 Hemiplegia, unspecified affecting left nondominant side: Secondary | ICD-10-CM

## 2024-09-03 DIAGNOSIS — I480 Paroxysmal atrial fibrillation: Secondary | ICD-10-CM

## 2024-09-03 DIAGNOSIS — Z89612 Acquired absence of left leg above knee: Secondary | ICD-10-CM | POA: Diagnosis not present

## 2024-09-03 DIAGNOSIS — J410 Simple chronic bronchitis: Secondary | ICD-10-CM

## 2024-09-03 DIAGNOSIS — I5032 Chronic diastolic (congestive) heart failure: Secondary | ICD-10-CM

## 2024-09-03 NOTE — Patient Instructions (Signed)

## 2024-09-03 NOTE — Progress Notes (Signed)
 " Cardiology Office Note:    Date:  09/03/2024   ID:  Amanda Lewis, DOB 01/30/1956, MRN [id number]  PCP:  Venancio Pock, PA-C  Cardiologist:  Lamar Fitch, MD    Referring MD: Venancio Pock, PA-C   Chief Complaint  Patient presents with   Follow-up  Cardiac wise doing fine  History of Present Illness:    Amanda Lewis is a 69 y.o. female past medical history significant for essential hypertension, obstructive sleep apnea, depression, history of CVA, above-knee amputation of the left side, in 2023 she was admitted with episode of atrial fibrillation she was put on the coagulation still continue.  Her CHADS2 Vascor equals 5.  Initially started with Multaq  but had difficulty tolerating this.  Now without any medication.  Overall doing well denies have any chest pain tightness squeezing pressure burning chest sitting on the wheelchair.  Continue taking anticoagulation.  Does have some history of depression.   Past Medical History:  Diagnosis Date   A-fib (HCC)    Anemia    Anxiety    on treatment   Arthritis    arms   Chronic heart failure with preserved ejection fraction (HCC) 07/18/2018   Last Assessment & Plan:   Formatting of this note might be different from the original.  TTE (11/18): EF 65%, indeterminate filling   -continue lasix  and HCTZ     Chronic lower back pain    Community acquired bacterial pneumonia 07/20/2018   Complex regional pain syndrome of left lower extremity 01/13/2012   Constipation 07/20/2018   Last Assessment & Plan:  Formatting of this note might be different from the original. -continue senokot and miralax  -added suppository and sorbitol today  -order KUB if develops abd pain/n/v   COPD (chronic obstructive pulmonary disease) (HCC)    usesO2 24/7   COPD with acute exacerbation (HCC) 12/24/2020   Depression    DNR (do not resuscitate)    Dysuria-frequency syndrome    Emphysema of lung (HCC)    Fatty liver    GERD (gastroesophageal reflux disease)     History of CVA (cerebrovascular accident) 07/18/2018   Last Assessment & Plan:  Formatting of this note might be different from the original. -not on antiplatelet therapy currently - has allergy (urticaria/hives) to aspirin   -would consider starting plavix monotherapy for secondary stroke prevention after completing DVT prophylaxis  -continue statin   History of encephalopathy    History of home oxygen  therapy 07/18/2018   History of kidney stones    Insomnia    Iron  deficiency anemia 12/29/2023   Left acetabular fracture (HCC) 07/18/2018   Last Assessment & Plan:  Formatting of this note might be different from the original. -ortho plans for non operative management -Management per ortho primary team: PT/OT, wound care, weight bearing status and pain control -PT/OT rec SNF vs progression to home with HHPT   Left hemiplegia (HCC)    Left upper quadrant pain 07/18/2018   Last Assessment & Plan:  Formatting of this note might be different from the original. Lipase neg 2/2 8th rib fracture   -lidocaine  patch  -encouraged IS   Leukocytosis 07/18/2018   Last Assessment & Plan:  Formatting of this note is different from the original. Lab Results  Component Value Date   WBC 14.9 (H) 07/20/2018  CXR and UA normal   -likely reactive, may be developing PNA -will start doxycycline -repeat CBC tomorrow   Major depression 07/18/2018   Last Assessment & Plan:  Formatting  of this note might be different from the original. QTc 438 11/19  -continue home lexapro , wellbutrin, duloxetine , seroquel , and trazodone  -continue diazepam  prn  -counseled her on polypharmacy and asking her PCP to try to wean her off some of these medications   Multiple thyroid  nodules    Paroxysmal atrial fibrillation (HCC) 01/18/2022   Pneumonia    Positive colorectal cancer screening using Cologuard test    Recurrent upper respiratory infection (URI)    Rotator cuff tear, right    RSD lower limb    left lower leg   S/P AKA (above knee  amputation) (HCC) 07/19/2015   Sleep apnea 2010   uses continous oxygen    Stroke [facility]) 1997    Past Surgical History:  Procedure Laterality Date   ABDOMINAL HYSTERECTOMY  08/30/2001   AMPUTATION  01/12/2012   Procedure: AMPUTATION ABOVE KNEE;  Surgeon: Jerona LULLA Sage, MD;  Location: [facility] OR;  Service: Orthopedics;  Laterality: Left;  Left Above Knee Amputation   APPENDECTOMY     BIOPSY THYROID      FRACTURE SURGERY Left    Hip   Morphine  Pump     Morphine  Pump Removed  08/30/2004   ORIF ANKLE FRACTURE  08/30/1993   STUMP REVISION Left 07/19/2015   Procedure: Revision Left Above Knee Amputation;  Surgeon: Jerona Sage LULLA, MD;  Location: [facility] OR;  Service: Orthopedics;  Laterality: Left;    Current Medications: Active Medications[1]   Allergies:   Amoxicillin-pot clavulanate, Aspirin , Bactrim [sulfamethoxazole-trimethoprim], Latex, Nitroglycerin, Oxycodone -aspirin , Sulfamethoxazole-trimethoprim, and Tape   Social History   Socioeconomic History   Marital status: Widowed    Spouse name: Not on file   Number of children: 2   Years of education: Not on file   Highest education level: Not on file  Occupational History   Occupation: disabled  Tobacco Use   Smoking status: Former    Current packs/day: 0.00    Average packs/day: 2.0 packs/day for 35.0 years (70.0 ttl pk-yrs)    Types: Cigarettes    Start date: 09/27/1973    Quit date: 09/27/2008    Years since quitting: 15.9   Smokeless tobacco: Never  Vaping Use   Vaping status: Never Used  Substance and Sexual Activity   Alcohol use: No   Drug use: No   Sexual activity: Not Currently    Birth control/protection: Surgical  Other Topics Concern   Not on file  Social History Narrative   Not on file   Social Drivers of Health   Tobacco Use: Medium Risk (09/03/2024)   Patient History    Smoking Tobacco Use: Former    Smokeless Tobacco Use: Never    Passive Exposure: Not on Actuary Strain: Not on file  Food  Insecurity: Low Risk (05/30/2024)   Received from [facility]   Epic    Within the past 12 months, you worried that your food would run out before you got money to buy more: Never true    Within the past 12 months, the food you bought just didn't last and you didn't have money to get more. : Never true  Transportation Needs: No Transportation Needs (05/30/2024)   Received from [facility]    In the past 12 months, has lack of reliable transportation kept you from medical appointments, meetings, work or from getting things needed for daily living? : No  Physical Activity: Not on file  Stress: Not on file  Social Connections: Not on file  Depression (PHQ2-9): Low Risk (06/05/2024)   Depression (PHQ2-9)    PHQ-2 Score: 0  Alcohol Screen: Not on file  Housing: Low Risk (05/30/2024)   Received from [facility]   Epic    What is your living situation today?: I have a steady place to live    Think about the place you live. Do you have problems with any of the following? Choose all that apply:: None/None on this list  Utilities: Low Risk (05/30/2024)   Received from [facility]   Utilities    In the past 12 months has the electric, gas, oil, or water company threatened to shut off services in your home? : No  Health Literacy: Not on file     Family History: The patient's family history includes Arthritis in her brother, father, and mother; Diabetes in her father and mother; Heart attack in her brother, father, and sister; Heart disease in her mother; Hyperlipidemia in her father and mother; Hypertension in her father and mother; Hypothyroidism in her mother; Kidney disease in her sister; Seizures in her sister. There is no history of Anesthesia problems. ROS:   Please see the history of present illness.    All 14 point review of systems negative except as described per history of present illness  EKGs/Labs/Other Studies Reviewed:    EKG  Interpretation Date/Time:  Monday September 03 2024 15:56:21 EST Ventricular Rate:  85 PR Interval:  158 QRS Duration:  88 QT Interval:  372 QTC Calculation: 442 R Axis:   42  Text Interpretation: Sinus rhythm with Premature atrial complexes When compared with ECG of 24-Dec-2020 01:25, PREVIOUS ECG IS PRESENT Confirmed by Bernie Charleston [phone]) on 09/03/2024 3:59:51 PM    Recent Labs: 06/05/2024: ALT 12; BUN 12; Creatinine 0.67; Hemoglobin 10.8; Platelet Count 206; Potassium 4.0; Sodium 144; TSH 2.030  Recent Lipid Panel No results found for: CHOL, TRIG, HDL, CHOLHDL, VLDL, LDLCALC, LDLDIRECT  Physical Exam:    VS:  BP (!) 140/60   Pulse 85   Ht 5' 4 (1.626 m)   Wt 198 lb (89.8 kg) Comment: verbal  SpO2 95%   BMI 33.99 kg/m     Wt Readings from Last 3 Encounters:  09/03/24 198 lb (89.8 kg)  02/27/24 197 lb (89.4 kg)  12/29/23 198 lb (89.8 kg)     GEN:  Well nourished, well developed in no acute distress HEENT: Normal NECK: No JVD; No carotid bruits LYMPHATICS: No lymphadenopathy CARDIAC: RRR, no murmurs, no rubs, no gallops RESPIRATORY:  Clear to auscultation without rales, wheezing or rhonchi  ABDOMEN: Soft, non-tender, non-distended MUSCULOSKELETAL:  No edema; No deformity  SKIN: Warm and dry LOWER EXTREMITIES: no swelling NEUROLOGIC:  Alert and oriented x 3 PSYCHIATRIC:  Normal affect   ASSESSMENT:    1. Paroxysmal atrial fibrillation (HCC)   2. Chronic heart failure with preserved ejection fraction (HCC)   3. Simple chronic bronchitis (HCC)   4. Left hemiplegia (HCC)   5. 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  She does have some APCs, will continue anticoagulation. Essential hypertension blood pressure well-controlled continue present management. Dyslipidemia I did review KPN  which show me LDL 60 HDL 62 will continue present management Hemiplegia.  Noted.  No new issues   Medication Adjustments/Labs and Tests Ordered: Current medicines are reviewed at length  with the patient today.  Concerns regarding medicines are outlined above.  Orders Placed This Encounter  Procedures   EKG 12-Lead   Medication changes: No orders of the defined types were placed in this encounter.   Signed, Lamar DOROTHA Fitch, MD, [facility] 09/03/2024 4:40 PM    [facility]     [1]  Current Meds  Medication Sig   acetaZOLAMIDE  (DIAMOX ) 500 MG capsule Take 1 capsule (500 mg total) by mouth daily.   albuterol  (PROVENTIL  HFA;VENTOLIN  HFA) 108 (90 BASE) MCG/ACT inhaler Inhale 2 puffs into the lungs every 6 (six) hours as needed for wheezing or shortness of breath. For shortness of breath   alendronate (FOSAMAX) 70 MG tablet Take 70 mg by mouth once a week.   ARIPiprazole (ABILIFY) 10 MG tablet Take 10 mg by mouth daily.   atorvastatin  (LIPITOR) 80 MG tablet Take 80 mg by mouth daily.   Calcium  Carbonate-Vitamin D (CALTRATE 600+D PO) Take by mouth.   clonazePAM (KLONOPIN) 0.5 MG tablet Take 0.5 mg by mouth daily as needed.   cromolyn (OPTICROM) 4 % ophthalmic solution 4 (four) times daily.   cyclobenzaprine  (FLEXERIL ) 5 MG tablet Take 5 mg by mouth 2 (two) times daily as needed for spasms.   diltiazem  (CARDIZEM  CD) 120 MG 24 hr capsule Take 1 capsule (120 mg total) by mouth daily.   dronedarone  (MULTAQ ) 400 MG tablet Take 1 tablet (400 mg total) by mouth 2 (two) times daily.   ELIQUIS  5 MG TABS tablet TAKE 1 TABLET BY MOUTH TWICE A DAY   FLUoxetine (PROZAC) 20 MG capsule Take 20 mg by mouth every morning.   fluticasone  (FLONASE ) 50 MCG/ACT nasal spray Place 2 sprays into both nostrils daily.   furosemide  (LASIX ) 40 MG tablet Take 40 mg by mouth daily.   gabapentin  (NEURONTIN ) 400 MG capsule Take 400 mg by mouth 3 (three) times daily.   lidocaine  (LIDODERM ) 5 % Place  onto the skin daily.   loratadine  (CLARITIN ) 10 MG tablet Take 10 mg by mouth daily.   melatonin 3 MG TABS tablet Take 3 mg by mouth at bedtime.   MYRBETRIQ 50 MG TB24 tablet Take 50 mg by mouth daily.   OHTUVAYRE  3 MG/2.5ML SUSP    OXYGEN  Inhale 2 L into the lungs.   pantoprazole  (PROTONIX ) 40 MG tablet Take 40 mg by mouth daily.   Polyethylene Glycol 3350  (MIRALAX  PO) Take by mouth.   potassium chloride (MICRO-K) 10 MEQ CR capsule Take 10 mEq by mouth daily.   tamsulosin (FLOMAX) 0.4 MG CAPS capsule Take 0.4 mg by mouth daily.   traMADol  (ULTRAM ) 50 MG tablet Take 50 mg by mouth 3 (three) times daily as needed.   traZODone (DESYREL) 100 MG tablet Take 100 mg by mouth at bedtime.   TRELEGY ELLIPTA 100-62.5-25 MCG/INH AEPB Inhale 1 puff into the lungs daily.   "

## 2024-09-04 DIAGNOSIS — D509 Iron deficiency anemia, unspecified: Secondary | ICD-10-CM

## 2024-09-05 ENCOUNTER — Inpatient Hospital Stay

## 2024-09-05 ENCOUNTER — Other Ambulatory Visit: Payer: Self-pay

## 2024-09-05 ENCOUNTER — Inpatient Hospital Stay: Attending: Hematology and Oncology | Admitting: Hematology and Oncology

## 2024-09-05 VITALS — BP 128/55 | HR 72 | Temp 98.1°F | Resp 20 | Ht 64.0 in | Wt 198.0 lb

## 2024-09-05 DIAGNOSIS — D509 Iron deficiency anemia, unspecified: Secondary | ICD-10-CM

## 2024-09-05 DIAGNOSIS — Z79899 Other long term (current) drug therapy: Secondary | ICD-10-CM | POA: Diagnosis not present

## 2024-09-05 LAB — IRON AND TIBC
Iron: 50 ug/dL (ref 28–170)
Saturation Ratios: 10 % — ABNORMAL LOW (ref 10.4–31.8)
TIBC: 486 ug/dL — ABNORMAL HIGH (ref 250–450)
UIBC: 436 ug/dL

## 2024-09-05 LAB — CBC WITH DIFFERENTIAL (CANCER CENTER ONLY)
Abs Immature Granulocytes: 0.01 K/uL (ref 0.00–0.07)
Basophils Absolute: 0 K/uL (ref 0.0–0.1)
Basophils Relative: 1 %
Eosinophils Absolute: 0.3 K/uL (ref 0.0–0.5)
Eosinophils Relative: 5 %
HCT: 38.5 % (ref 36.0–46.0)
Hemoglobin: 11.5 g/dL — ABNORMAL LOW (ref 12.0–15.0)
Immature Granulocytes: 0 %
Lymphocytes Relative: 28 %
Lymphs Abs: 1.6 K/uL (ref 0.7–4.0)
MCH: 27.4 pg (ref 26.0–34.0)
MCHC: 29.9 g/dL — ABNORMAL LOW (ref 30.0–36.0)
MCV: 91.7 fL (ref 80.0–100.0)
Monocytes Absolute: 0.4 K/uL (ref 0.1–1.0)
Monocytes Relative: 6 %
Neutro Abs: 3.6 K/uL (ref 1.7–7.7)
Neutrophils Relative %: 60 %
Platelet Count: 201 K/uL (ref 150–400)
RBC: 4.2 MIL/uL (ref 3.87–5.11)
RDW: 13.5 % (ref 11.5–15.5)
WBC Count: 5.9 K/uL (ref 4.0–10.5)
nRBC: 0 % (ref 0.0–0.2)

## 2024-09-05 LAB — VITAMIN B12: Vitamin B-12: 316 pg/mL (ref 180–914)

## 2024-09-05 LAB — FERRITIN: Ferritin: 11 ng/mL (ref 11–307)

## 2024-09-05 LAB — CMP (CANCER CENTER ONLY)
ALT: 15 U/L (ref 0–44)
AST: 20 U/L (ref 15–41)
Albumin: 4.3 g/dL (ref 3.5–5.0)
Alkaline Phosphatase: 95 U/L (ref 38–126)
Anion gap: 5 (ref 5–15)
BUN: 14 mg/dL (ref 8–23)
CO2: 39 mmol/L — ABNORMAL HIGH (ref 22–32)
Calcium: 10.3 mg/dL (ref 8.9–10.3)
Chloride: 100 mmol/L (ref 98–111)
Creatinine: 0.73 mg/dL (ref 0.44–1.00)
GFR, Estimated: >60 mL/min
Glucose, Bld: 119 mg/dL — ABNORMAL HIGH (ref 70–99)
Potassium: 3.7 mmol/L (ref 3.5–5.1)
Sodium: 144 mmol/L (ref 135–145)
Total Bilirubin: 0.4 mg/dL (ref 0.0–1.2)
Total Protein: 7.1 g/dL (ref 6.5–8.1)

## 2024-09-05 LAB — TSH: TSH: 1.11 u[IU]/mL (ref 0.350–4.500)

## 2024-09-05 LAB — FOLATE: Folate: 3.1 ng/mL — ABNORMAL LOW

## 2024-09-05 NOTE — Progress Notes (Signed)
 " [facility] [address] [place],  [place]  [address] [phone]  Clinic Day:  09/05/2024   Referring physician: O'Buch, Greta, PA-C  Patient Care Team: Patient Care Team: O'Buch, Greta, PA-C as PCP - General (Internal Medicine) Harl Eleanor LABOR, NP as Nurse Practitioner (Hematology and Oncology)   REASON FOR CONSULTATION:  Iron  deficiency anemia  HISTORY OF PRESENT ILLNESS:  Amanda Lewis is a 69 y.o. female with a history of iron  deficiency anemia who is referred in consultation by Glenis Olive, PA-C for assessment and management. She has a history of requiring multiple blood transfusions over the last year. She states she used to get iron  injections years ago and was then switched to oral iron . Her most recent transfusion occurred on 03/21 after a follow up with cardiology for atrial fibrillation. She feels better after infusions; however, it doesn't last long before she is fatigued again. She appears very pale today and complains of severe fatigue. She has a AKA of the left lower extremity and reports increased difficulty transferring from her wheelchair. She denies fever, chills, nausea or vomiting. She does have chronic respiratory conditions and wears oxygen . She does not note an increase to shortness of breath, cough or chest pain. She complains of ongoing constipation. She does not report any blood in her stools. Medical history is significant for atrial fibrillation, incontinence, carpal tunnel syndrome, fatty liver, insomnia, chronic hypoxemic respiratory failure,  left hemiplegia, overactive bladder, AKA of the left lower extremity, hypertension, sleep apnea and stroke. Surgical history consists of hysterectomy, appendectomy, amputation of the left lower extremity as well as revision of that amputation. Family history consists of heart disease, arthritis, myocardial infarction, hyperlipidemia, seizures, hypothyroidism, hypertension and diabetes mellitus. She is a  former smoker having quit 15 years ago.  REVIEW OF SYSTEMS:  Review of Systems  Constitutional:  Positive for fatigue.  HENT:  Negative.    Eyes: Negative.   Respiratory:  Positive for shortness of breath.   Cardiovascular: Negative.   Gastrointestinal:  Positive for constipation.  Genitourinary:  Positive for bladder incontinence.   Musculoskeletal:  Positive for gait problem.  Skin: Negative.   Neurological:  Positive for gait problem and numbness.  Hematological: Negative.   Psychiatric/Behavioral:  Positive for sleep disturbance.      VITALS:  Blood pressure (!) 128/55, pulse 72, temperature 98.1 F (36.7 C), temperature source Oral, resp. rate 20, height 5' 4 (1.626 m), weight 198 lb (89.8 kg), SpO2 100%.  Wt Readings from Last 3 Encounters:  09/05/24 198 lb (89.8 kg)  09/03/24 198 lb (89.8 kg)  02/27/24 197 lb (89.4 kg)    Body mass index is 33.99 kg/m.  Performance status (ECOG): 2 - Symptomatic, <50% confined to bed  PHYSICAL EXAM:  Physical Exam Constitutional:      Appearance: Normal appearance. She is normal weight.  HENT:     Head: Normocephalic and atraumatic.     Mouth/Throat:     Mouth: Mucous membranes are moist.  Cardiovascular:     Rate and Rhythm: Normal rate and regular rhythm.  Pulmonary:     Effort: Pulmonary effort is normal.     Breath sounds: Normal breath sounds.  Musculoskeletal:        General: Normal range of motion.     Cervical back: Normal range of motion.     Comments: AKA left lower extremity   Skin:    General: Skin is warm and dry.  Neurological:  General: No focal deficit present.     Mental Status: She is alert and oriented to person, place, and time. Mental status is at baseline.     Gait: Gait abnormal.  Psychiatric:        Mood and Affect: Mood normal.        Behavior: Behavior normal.        Thought Content: Thought content normal.        Judgment: Judgment normal.      LABS:      Latest Ref Rng & Units  09/05/2024   10:02 AM 06/05/2024   10:49 AM 02/27/2024   10:22 AM  CBC  WBC 4.0 - 10.5 K/uL 5.9  5.5  7.9   Hemoglobin 12.0 - 15.0 g/dL 88.4  89.1  89.4   Hematocrit 36.0 - 46.0 % 38.5  35.8  36.9   Platelets 150 - 400 K/uL 201  206  218       Latest Ref Rng & Units 06/05/2024   10:49 AM 12/29/2023   10:19 AM 12/27/2020    3:56 AM  CMP  Glucose 70 - 99 mg/dL 865  861  98   BUN 8 - 23 mg/dL 12  11  10    Creatinine 0.44 - 1.00 mg/dL 9.32  9.37  9.52   Sodium 135 - 145 mmol/L 144  142  142   Potassium 3.5 - 5.1 mmol/L 4.0  3.3  3.7   Chloride 98 - 111 mmol/L 103  97  110   CO2 22 - 32 mmol/L 33  36  26   Calcium  8.9 - 10.3 mg/dL 89.9  9.6  9.8   Total Protein 6.5 - 8.1 g/dL 7.0  7.1    Total Bilirubin 0.0 - 1.2 mg/dL 0.5  0.4    Alkaline Phos 38 - 126 U/L 87  93    AST 15 - 41 U/L 17  20    ALT 0 - 44 U/L 12  15       No results found for: CEA1, CEA / No results found for: CEA1, CEA No results found for: PSA1 No results found for: CAN199 No results found for: CAN125  No results found for: TOTALPROTELP, ALBUMINELP, A1GS, A2GS, BETS, BETA2SER, GAMS, MSPIKE, SPEI Lab Results  Component Value Date   TIBC 413 06/05/2024   TIBC 428 02/10/2024   TIBC 552 (H) 12/29/2023   FERRITIN 47 06/05/2024   FERRITIN 17 02/10/2024   FERRITIN 4 (L) 12/29/2023   IRONPCTSAT 10 (L) 06/05/2024   IRONPCTSAT 6 (L) 02/10/2024   IRONPCTSAT 4 (L) 12/29/2023   No results found for: LDH  STUDIES:  No results found.    HISTORY:   Past Medical History:  Diagnosis Date   A-fib (HCC)    Anemia    Anxiety    on treatment   Arthritis    arms   Chronic heart failure with preserved ejection fraction (HCC) 07/18/2018   Last Assessment & Plan:   Formatting of this note might be different from the original.  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 -continue senokot and miralax  -added suppository and sorbitol today  -order KUB if develops abd  pain/n/v   COPD (chronic obstructive pulmonary disease) (HCC)    usesO2 24/7   COPD with acute exacerbation (HCC) 12/24/2020   Depression    DNR (do not resuscitate)    Dysuria-frequency syndrome    Emphysema of lung (HCC)    Fatty liver    GERD (gastroesophageal reflux disease)    History of CVA (cerebrovascular accident) 07/18/2018   Last Assessment & Plan:  Formatting of this note might be different from the original. -not on antiplatelet therapy currently - has allergy (urticaria/hives) to aspirin   -would consider starting plavix monotherapy for secondary stroke prevention after completing DVT prophylaxis  -continue statin   History of encephalopathy    History of home oxygen  therapy 07/18/2018   History of kidney stones    Insomnia    Iron  deficiency anemia 12/29/2023   Left acetabular fracture (HCC) 07/18/2018   Last Assessment & Plan:  Formatting of this note might be different from the original. -ortho plans for non operative management -Management per ortho primary team: PT/OT, wound care, weight bearing status and pain control -PT/OT rec SNF vs progression to home with HHPT   Left hemiplegia (HCC)    Left upper quadrant pain 07/18/2018   Last Assessment & Plan:  Formatting of this note might be different from the original. Lipase neg 2/2 8th rib fracture   -lidocaine  patch  -encouraged IS   Leukocytosis 07/18/2018   Last Assessment & Plan:  Formatting of this note is different from the original. Lab Results  Component Value Date   WBC 14.9 (H) 07/20/2018  CXR and UA normal   -likely reactive, may be developing PNA -will start doxycycline -repeat CBC tomorrow   Major depression 07/18/2018   Last Assessment & Plan:   Formatting of this note might be different from the original. QTc 438 11/19  -continue home lexapro , wellbutrin, duloxetine , seroquel , and trazodone  -continue diazepam  prn  -counseled her on polypharmacy and asking her PCP to try to wean her off some of these medications   Multiple thyroid  nodules    Paroxysmal atrial fibrillation (HCC) 01/18/2022   Pneumonia    Positive colorectal cancer screening using Cologuard test    Recurrent upper respiratory infection (URI)    Rotator cuff tear, right    RSD lower limb    left lower leg   S/P AKA (above knee amputation) (HCC) 07/19/2015   Sleep apnea 2010   uses continous oxygen    Stroke [facility]) 1997    Past Surgical History:  Procedure Laterality Date   ABDOMINAL HYSTERECTOMY  08/30/2001   AMPUTATION  01/12/2012   Procedure: AMPUTATION ABOVE KNEE;  Surgeon: Jerona LULLA Sage, MD;  Location: [facility] OR;  Service: Orthopedics;  Laterality: Left;  Left Above Knee Amputation   APPENDECTOMY     BIOPSY THYROID      FRACTURE SURGERY Left    Hip   Morphine  Pump     Morphine  Pump Removed  08/30/2004   ORIF ANKLE FRACTURE  08/30/1993   STUMP REVISION Left 07/19/2015   Procedure: Revision Left Above Knee Amputation;  Surgeon: Jerona Sage LULLA, MD;  Location: [facility] OR;  Service: Orthopedics;  Laterality: Left;    Family History  Problem Relation Age of Onset   Diabetes Mother    Hypertension Mother    Heart disease Mother    Arthritis Mother    Hyperlipidemia Mother    Hypothyroidism Mother    Diabetes Father    Hypertension Father    Hyperlipidemia Father  Arthritis Father    Heart attack Father    Heart attack Sister    Seizures Sister    Kidney disease Sister    Heart attack Brother    Arthritis Brother    Anesthesia problems Neg Hx     Social History:  reports that she quit smoking about 15 years ago. Her smoking use included cigarettes. She started smoking about 50 years ago. She has a 70 pack-year smoking history. She has never used smokeless  tobacco. She reports that she does not drink alcohol and does not use drugs.The patient is accompanied by daughter today.  Allergies:  Allergies  Allergen Reactions   Amoxicillin-Pot Clavulanate     Other Reaction(s): Not available   Aspirin  Hives   Bactrim [Sulfamethoxazole-Trimethoprim] Hives   Latex     Other Reaction(s): Not available   Nitroglycerin Other (See Comments)    Blood pressure drops dangerously low   Oxycodone -Aspirin  Hives    Takes percocet without problems   Sulfamethoxazole-Trimethoprim Hives   Tape Rash    Adhesive Adhesive    Current Medications: Current Outpatient Medications  Medication Sig Dispense Refill   acetaZOLAMIDE  (DIAMOX ) 500 MG capsule Take 1 capsule (500 mg total) by mouth daily. 20 capsule 0   albuterol  (PROVENTIL  HFA;VENTOLIN  HFA) 108 (90 BASE) MCG/ACT inhaler Inhale 2 puffs into the lungs every 6 (six) hours as needed for wheezing or shortness of breath. For shortness of breath     alendronate (FOSAMAX) 70 MG tablet Take 70 mg by mouth once a week.     ARIPiprazole (ABILIFY) 10 MG tablet Take 10 mg by mouth daily.     atorvastatin  (LIPITOR) 80 MG tablet Take 80 mg by mouth daily.     Calcium  Carbonate-Vitamin D (CALTRATE 600+D PO) Take by mouth.     clonazePAM (KLONOPIN) 0.5 MG tablet Take 0.5 mg by mouth daily as needed.     cromolyn (OPTICROM) 4 % ophthalmic solution 4 (four) times daily.     cyclobenzaprine  (FLEXERIL ) 5 MG tablet Take 5 mg by mouth 2 (two) times daily as needed for spasms.     diltiazem  (CARDIZEM  CD) 120 MG 24 hr capsule Take 1 capsule (120 mg total) by mouth daily. 90 capsule 1   dronedarone  (MULTAQ ) 400 MG tablet Take 1 tablet (400 mg total) by mouth 2 (two) times daily. 180 tablet 2   ELIQUIS  5 MG TABS tablet TAKE 1 TABLET BY MOUTH TWICE A DAY 180 tablet 1   FLUoxetine (PROZAC) 20 MG capsule Take 20 mg by mouth every morning.     fluticasone  (FLONASE ) 50 MCG/ACT nasal spray Place 2 sprays into both nostrils daily.      furosemide  (LASIX ) 40 MG tablet Take 40 mg by mouth daily.     gabapentin  (NEURONTIN ) 400 MG capsule Take 400 mg by mouth 3 (three) times daily.     lidocaine  (LIDODERM ) 5 % Place onto the skin daily.     loratadine  (CLARITIN ) 10 MG tablet Take 10 mg by mouth daily.     melatonin 3 MG TABS tablet Take 3 mg by mouth at bedtime.     MYRBETRIQ 50 MG TB24 tablet Take 50 mg by mouth daily.     OHTUVAYRE  3 MG/2.5ML SUSP      OXYGEN  Inhale 2 L into the lungs.     pantoprazole  (PROTONIX ) 40 MG tablet Take 40 mg by mouth daily.     Polyethylene Glycol 3350  (MIRALAX  PO) Take by mouth.     potassium chloride (  MICRO-K) 10 MEQ CR capsule Take 10 mEq by mouth daily.     tamsulosin (FLOMAX) 0.4 MG CAPS capsule Take 0.4 mg by mouth daily.     traMADol  (ULTRAM ) 50 MG tablet Take 50 mg by mouth 3 (three) times daily as needed.     traZODone (DESYREL) 100 MG tablet Take 100 mg by mouth at bedtime.     TRELEGY ELLIPTA 100-62.5-25 MCG/INH AEPB Inhale 1 puff into the lungs daily.     No current facility-administered medications for this visit.     ASSESSMENT & PLAN:   Assessment:  Amanda Lewis is a 69 y.o. female with history of iron  deficiency anemia. She has been on oral iron  for several years and iron  injections prior to that. Most recently she has required blood transfusions due to her anemia with the most recent being 03/21. She received iron  infusions with the last being 4 weeks ago. Today, CBC reveals Hgb 11.5 increased from 10.8 prior to infusions. Symptoms are finally improving since iron  infusions.   Plan: 1.  Return to clinic in 3 months.   I discussed the assessment and treatment plan with the patient.  The patient was provided an opportunity to ask questions and all were answered.  The patient agreed with the plan and demonstrated an understanding of the instructions.    Thank you for the referral    30 minutes was spent in patient care.  This included time spent preparing to see the patient  (e.g., review of tests), obtaining and/or reviewing separately obtained history, counseling and educating the patient/family/caregiver, ordering medications, tests, or procedures; documenting clinical information in the electronic or other health record, independently interpreting results and communicating results to the patient/family/caregiver as well as coordination of care.      Eleanor DELENA Bach, NP   Family Nurse Practitioner - Board Certified [facility] [phone]     "

## 2024-09-18 ENCOUNTER — Encounter: Payer: Self-pay | Admitting: Hematology and Oncology

## 2024-12-04 ENCOUNTER — Inpatient Hospital Stay: Admitting: Hematology and Oncology

## 2024-12-04 ENCOUNTER — Inpatient Hospital Stay
